# Patient Record
Sex: Male | Born: 1965 | Race: Black or African American | Hispanic: No | Marital: Married | Smoking: Never smoker
Health system: Southern US, Community
[De-identification: ages and names within clinical notes are randomized; demographics above are authoritative.]

## PROBLEM LIST (undated history)

## (undated) DIAGNOSIS — N182 Chronic kidney disease, stage 2 (mild): Secondary | ICD-10-CM

## (undated) DIAGNOSIS — R001 Bradycardia, unspecified: Secondary | ICD-10-CM

## (undated) DIAGNOSIS — E7800 Pure hypercholesterolemia, unspecified: Secondary | ICD-10-CM

## (undated) DIAGNOSIS — I10 Essential (primary) hypertension: Secondary | ICD-10-CM

## (undated) DIAGNOSIS — I350 Nonrheumatic aortic (valve) stenosis: Secondary | ICD-10-CM

## (undated) DIAGNOSIS — A048 Other specified bacterial intestinal infections: Secondary | ICD-10-CM

## (undated) DIAGNOSIS — I2510 Atherosclerotic heart disease of native coronary artery without angina pectoris: Secondary | ICD-10-CM

## (undated) HISTORY — DX: Pure hypercholesterolemia, unspecified: E78.00

## (undated) HISTORY — DX: Atherosclerotic heart disease of native coronary artery without angina pectoris: I25.10

## (undated) HISTORY — PX: CARDIAC CATHETERIZATION: SHX172

## (undated) HISTORY — DX: Bradycardia, unspecified: R00.1

## (undated) HISTORY — DX: Nonrheumatic aortic (valve) stenosis: I35.0

## (undated) HISTORY — DX: Essential (primary) hypertension: I10

## (undated) HISTORY — DX: Other specified bacterial intestinal infections: A04.8

## (undated) HISTORY — PX: WISDOM TOOTH EXTRACTION: SHX21

---

## 2001-02-06 ENCOUNTER — Emergency Department (HOSPITAL_COMMUNITY): Admission: EM | Admit: 2001-02-06 | Discharge: 2001-02-06 | Payer: Self-pay | Admitting: Emergency Medicine

## 2001-02-06 ENCOUNTER — Encounter: Payer: Self-pay | Admitting: Emergency Medicine

## 2002-10-04 ENCOUNTER — Observation Stay (HOSPITAL_COMMUNITY): Admission: AD | Admit: 2002-10-04 | Discharge: 2002-10-05 | Payer: Self-pay | Admitting: Emergency Medicine

## 2002-10-04 ENCOUNTER — Encounter: Payer: Self-pay | Admitting: *Deleted

## 2002-10-04 ENCOUNTER — Encounter (INDEPENDENT_AMBULATORY_CARE_PROVIDER_SITE_OTHER): Payer: Self-pay | Admitting: Cardiology

## 2004-05-07 ENCOUNTER — Emergency Department (HOSPITAL_COMMUNITY): Admission: EM | Admit: 2004-05-07 | Discharge: 2004-05-07 | Payer: Self-pay | Admitting: Emergency Medicine

## 2004-05-08 ENCOUNTER — Other Ambulatory Visit: Admission: RE | Admit: 2004-05-08 | Discharge: 2004-05-08 | Payer: Self-pay | Admitting: Interventional Radiology

## 2004-05-08 ENCOUNTER — Encounter: Admission: RE | Admit: 2004-05-08 | Discharge: 2004-05-08 | Payer: Self-pay | Admitting: Orthopedic Surgery

## 2004-05-08 ENCOUNTER — Encounter (INDEPENDENT_AMBULATORY_CARE_PROVIDER_SITE_OTHER): Payer: Self-pay | Admitting: *Deleted

## 2007-07-28 ENCOUNTER — Emergency Department (HOSPITAL_COMMUNITY): Admission: EM | Admit: 2007-07-28 | Discharge: 2007-07-28 | Payer: Self-pay | Admitting: Emergency Medicine

## 2008-06-14 ENCOUNTER — Ambulatory Visit (HOSPITAL_COMMUNITY): Admission: RE | Admit: 2008-06-14 | Discharge: 2008-06-14 | Payer: Self-pay | Admitting: Cardiology

## 2008-11-08 ENCOUNTER — Encounter: Payer: Self-pay | Admitting: Emergency Medicine

## 2008-11-08 ENCOUNTER — Ambulatory Visit: Payer: Self-pay | Admitting: Cardiovascular Disease

## 2008-11-08 ENCOUNTER — Ambulatory Visit: Payer: Self-pay | Admitting: Diagnostic Radiology

## 2008-11-08 ENCOUNTER — Inpatient Hospital Stay (HOSPITAL_COMMUNITY): Admission: AD | Admit: 2008-11-08 | Discharge: 2008-11-10 | Payer: Self-pay | Admitting: Cardiology

## 2010-05-18 LAB — POCT CARDIAC MARKERS
CKMB, poc: 4.9 ng/mL (ref 1.0–8.0)
Myoglobin, poc: 137 ng/mL (ref 12–200)
Myoglobin, poc: 98.7 ng/mL (ref 12–200)
Troponin i, poc: <0.05 ng/mL (ref 0.00–0.09)

## 2010-05-18 LAB — BASIC METABOLIC PANEL
BUN: 16 mg/dL (ref 6–23)
CO2: 32 mEq/L (ref 19–32)
Calcium: 8.5 mg/dL (ref 8.4–10.5)
Calcium: 9.6 mg/dL (ref 8.4–10.5)
Creatinine, Ser: 1.11 mg/dL (ref 0.4–1.5)
Creatinine, Ser: 1.1 mg/dL (ref 0.4–1.5)
GFR calc Af Amer: >60 mL/min (ref >60)
GFR calc non Af Amer: >60 mL/min (ref >60)
GFR calc non Af Amer: >60 mL/min (ref >60)
Glucose, Bld: 99 mg/dL (ref 70–99)
Potassium: 3.7 mEq/L (ref 3.5–5.1)
Sodium: 143 mEq/L (ref 135–145)

## 2010-05-18 LAB — CARDIAC PANEL(CRET KIN+CKTOT+MB+TROPI)
CK, MB: 3.3 ng/mL (ref 0.3–4.0)
CK, MB: 4.6 ng/mL — ABNORMAL HIGH (ref 0.3–4.0)
Relative Index: 1.6 (ref 0.0–2.5)
Relative Index: 2 (ref 0.0–2.5)
Total CK: 186 U/L (ref 7–232)
Total CK: 280 U/L — ABNORMAL HIGH (ref 7–232)
Troponin I: 0.01 ng/mL (ref 0.00–0.06)
Troponin I: 0.02 ng/mL (ref 0.00–0.06)
Troponin I: 0.02 ng/mL (ref 0.00–0.06)

## 2010-05-18 LAB — CBC
HCT: 39.2 % (ref 39.0–52.0)
Hemoglobin: 13.3 g/dL (ref 13.0–17.0)
Hemoglobin: 14.7 g/dL (ref 13.0–17.0)
MCHC: 33.5 g/dL (ref 30.0–36.0)
MCHC: 33.4 g/dL (ref 30.0–36.0)
MCV: 85.8 fL (ref 78.0–100.0)
Platelets: 180 10*3/uL (ref 150–400)
RBC: 4.41 MIL/uL (ref 4.22–5.81)
RBC: 5.14 MIL/uL (ref 4.22–5.81)
RDW: 13.1 % (ref 11.5–15.5)
WBC: 6 10*3/uL (ref 4.0–10.5)
WBC: 5.8 10*3/uL (ref 4.0–10.5)

## 2010-05-18 LAB — DIFFERENTIAL
Lymphocytes Relative: 39 % (ref 12–46)
Lymphs Abs: 2.3 10*3/uL (ref 0.7–4.0)
Monocytes Absolute: 0.5 10*3/uL (ref 0.1–1.0)
Monocytes Relative: 8 % (ref 3–12)
Neutro Abs: 2.8 10*3/uL (ref 1.7–7.7)
Neutrophils Relative %: 50 % (ref 43–77)

## 2010-05-18 LAB — LIPID PANEL
Cholesterol: 163 mg/dL (ref 0–200)
Total CHOL/HDL Ratio: 5.3 RATIO

## 2010-05-18 LAB — PROTIME-INR
INR: 1.1 (ref 0.00–1.49)
Prothrombin Time: 13.6 seconds (ref 11.6–15.2)

## 2010-05-18 LAB — HEPARIN LEVEL (UNFRACTIONATED): Heparin Unfractionated: 1.01 IU/mL — ABNORMAL HIGH (ref 0.30–0.70)

## 2010-06-02 ENCOUNTER — Other Ambulatory Visit: Payer: Self-pay | Admitting: Cardiology

## 2010-06-02 DIAGNOSIS — I2510 Atherosclerotic heart disease of native coronary artery without angina pectoris: Secondary | ICD-10-CM

## 2010-06-04 NOTE — Telephone Encounter (Signed)
escribe medication per fax request  

## 2010-06-26 NOTE — H&P (Signed)
NAME:  GENEROSO, CROPPER NO.:  [id number]   MEDICAL RECORD NO.:  [id number]           PATIENT TYPE:   LOCATION:                                 FACILITY:   PHYSICIAN:  Peter M. [place]aziland, M.D.       DATE OF BIRTH:   DATE OF ADMISSION:  06/14/2008  DATE OF DISCHARGE:                              HISTORY & PHYSICAL   HISTORY OF PRESENT ILLNESS:  Mr. Nathan Barrett is a 45 year old African American  male who was seen initially for evaluation for murmur.  He has a history  of hypertension and mild hypercholesterolemia.  On subsequent  evaluation, it was noted that he had had a cardiac catheterization in  2004, which demonstrated fairly extensive nonobstructive coronary  disease.  For this reason, the patient was further evaluated with an  echocardiogram and a stress Cardiolite study.  On his echocardiogram, he  was noted to have normal left ventricular size, wall thickness, and  function.  Ejection fraction was normal at 60%.  He was found to have  very mild aortic stenosis with a peak gradient of 18 mmHg, mean gradient  of 10 mmHg and a valve area of 1.5 sq cm.  His echo was otherwise  normal.  On stress Cardiolite study, he was able to walk for 13 minutes  and 30 seconds on the Bruce protocol.  He experienced no chest pain.  He  did have ST-segment depression in the lateral leads.  Subsequent  Cardiolite images demonstrated a moderate reversible defect in the  inferior wall consistent with ischemia.  Ejection fraction was normal at  64%.  Given these findings, it is felt that he probably has progressive  disease in the right coronary territory.  It is recommended he undergo  cardiac catheterization.   PAST MEDICAL HISTORY:  1. Hypertension.  2. Mild hypercholesterolemia.  3. History of H. pylori infection.  4. Mild aortic stenosis.  5. Coronary artery disease.   ALLERGIES:  No known allergies.   CURRENT MEDICATIONS:  1. Benicar HCT 40/25 mg per day.  2. Crestor 10 mg per  day.  3. Aspirin daily.   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  He exercises walking 3 days per week.  His denies tobacco or alcohol use.  He is married and has three  children.   FAMILY HISTORY:  Father is age 45 and has had prior coronary bypass  surgery.  He had a heart attack.  Mother died at age 75 with leukemia.  One brother age 70 has obstructive sleep apnea.   REVIEW OF SYSTEMS:  He denies any edema, orthopnea, or PND.  He has had  no bleeding problems.  His weight has been stable.  He has had no bowel  or bladder complaints.  All other systems are reviewed and are negative.   PHYSICAL EXAMINATION:  GENERAL:  The patient is a pleasant black male in  no apparent distress.  VITAL SIGNS:  Weight is 189.4 pounds, blood pressure is 122/70, pulse 52  and regular, respirations were normal.  HEENT:  Normocephalic, atraumatic.  Pupils equal, round, and reactive to  light and accommodation.  Extraocular movements were full.  Oropharynx  is clear.  NECK:  Supple without JVD, adenopathy, thyromegaly, or bruits.  LUNGS:  Clear.  CARDIAC:  A grade 2/6 systolic murmur in the apex.  There is normal S1  and S2 without gallop or click.  ABDOMEN:  Soft and nontender without mass or hepatosplenomegaly.  Femoral and pedal pulses are 2+ and symmetric.  He has no edema.  NEUROLOGIC:  Alert and oriented x4.  Cranial nerves II-XII are intact.  Mood is appropriate.  SKIN:  Warm and dry.   LABORATORY DATA:  ECG at rest shows sinus bradycardia.  He has some T-  wave flattening in leads III and V5 and V6.  Chest x-ray shows no active  disease.   IMPRESSION:  1. Coronary artery disease with abnormal stress Cardiolite study      showing evidence of inferior wall ischemia.  2. Hypertension.  3. Mild hypercholesterolemia.  4. Very mild aortic stenosis.   PLAN:  We will proceed with diagnostic cardiac catheterization with  potential  intervention if indicated.           ______________________________  Peter M. [place]il[place], M.D.     PMJ/MEDQ  D:  06/08/2008  T:  06/08/2008  Job:  [id number]   cc:   Carren Rang, M.D.

## 2010-06-26 NOTE — Cardiovascular Report (Signed)
NAME:  LOGON, UTTECH NO.:  [id number]   MEDICAL RECORD NO.:  [id number]          PATIENT TYPE:  OIB   LOCATION:  2899                         FACILITY:  [facility]   PHYSICIAN:  Peter M. [place]il[place], M.D.  DATE OF BIRTH:  1965-10-01   DATE OF PROCEDURE:  06/14/2008  DATE OF DISCHARGE:                            CARDIAC CATHETERIZATION   INDICATIONS FOR PROCEDURE:  A 45 year old African American male with  history of nonobstructive coronary disease by cardiac catheterization in  2004.  Recent stress Cardiolite study demonstrated evidence of inferior  wall ischemia and a high workload.  The patient was asymptomatic.  He  did have ECG changes as well.   PROCEDURE:  Left heart catheterization, coronary left ventricular  angiography.   EQUIPMENT:  A 6-French 4 cm right and left Judkins catheter, 6-French  pigtail catheter, and 6-French arterial sheath.   MEDICATIONS:  Local anesthesia 1% Xylocaine, Versed 2 mg IV, fentanyl 25  mcg IV.   CONTRAST:  105 mL of Omnipaque.   HEMODYNAMIC DATA:  Aortic pressure was 116/69 with a mean of 90 mmHg,  left ventricle pressure was 113 with an EDP of 11 mmHg.  The patient's  heart rate throughout the procedure was in the high 40s and low 50s.   ANGIOGRAPHIC DATA:  Left coronary artery rises and distributes normally  and stenosis in the very proximal vessel.  It had scattered  irregularities throughout its course.  In the distal vessel as it wraps  around the apex, there is 99% stenosis distally.  There is a large first  diagonal branch which bifurcates.  This has a high takeoff.  At the  bifurcation point, there is a 60% stenosis involving both branches.  The  second diagonal branch is moderate size and has irregularities up to  20%.   The left circumflex coronary artery is a small diminutive vessel.  It  terminates in a single very tiny marginal branch.  There is a 99%  stenosis at the origin of this tiny marginal branch.   The  right coronary artery is a dominant vessel.  It is diffusely  diseased.  There is 40-50% stenosis in the proximal vessel.  This is  followed by 40% stenosis in the midvessel and then another 50% stenosis  prior to the crux.  The distal vessel is diffusely diseased up to 40%.  The posterior descending artery is occluded in the midvessel.  There are  left-to-right collaterals to this vessel.  The posterolateral branch is  small in caliber and has a 95% stenosis proximally.   Left ventricular angiography was performed in the RAO view.  This  demonstrates normal left ventricular size and contractility with normal  systolic function.  Ejection fraction is estimated at 65%.   FINAL INTERPRETATION:  1. Severe three-vessel obstructive coronary artery disease,      predominantly involving small caliber distal branches.  He has      moderate disease in the proximal LAD and diagonal branches.  2. Normal left ventricular function.   PLAN:  The patient is really not well suited for percutaneous  intervention given the small caliber vessels.  Given his excellent  exercise tolerance and lack of recent symptoms, I would recommend  aggressive medical therapy and risk factor modification.           ______________________________  Peter M. [place], M.D.     PMJ/MEDQ  D:  06/14/2008  T:  06/15/2008  Job:  [id number]   cc:   Carren Rang, M.D.

## 2010-06-29 NOTE — Cardiovascular Report (Signed)
NAME:  Nathan Barrett, Nathan Barrett                           ACCOUNT NO.:  [id number]   MEDICAL RECORD NO.:  [id number]                   PATIENT TYPE:  INP   LOCATION:  1823                                 FACILITY:  [facility]   PHYSICIAN:  Madaline Savage, M.D.             DATE OF BIRTH:  15-Mar-1965   DATE OF PROCEDURE:  10/04/2002  DATE OF DISCHARGE:                              CARDIAC CATHETERIZATION   PROCEDURES PERFORMED:  1. Combined left heart catheterization.  2. Ascending aorta angiography.  3. Abdominal aorta angiography.   ENTRY SITE:  Right femoral.   DYE USED:  Omnipaque.   COMPLICATIONS:  None.   PATIENT PROFILE:  The patient is a 45 year old African-American gentleman  with a two year history of hypertension who awoke shortly after 3 a.m. today  and had subxiphoid and chest pain.  He presented to the [facility] Emergency  Room where a CK-MB and a troponin I were negative, but a myoglobin was 500  and abnormal.  His EKG showed ST segment elevation of approximately 2 mm in  leads V2, 3, and V4 and were unchanged from 4:08 a.m. to approximately four  hours and 30 minutes later.  There were also additional EKG nonspecific T-  wave abnormalities in leads 2, 3, and aVF.  The patient's pain was better at  the time of evaluation and because of his family history of CAD, his  hypertension, his abnormal EKG, his chest pain, and his elevated myoglobin,  it was decided to bring him to the catheterization laboratory which was done  on an urgent basis once a room was available.   RESULTS:  PRESSURES:  The left ventricular pressure was 125/9, end-diastolic  pressure 14.  Central aortic pressure was 125/80, mean of 100.  No aortic  valve gradient by pullback technique.   ANGIOGRAPHIC RESULTS:  There were no pericardial valvular or coronary  calcifications noted by fluoroscopy.  1. The left main coronary artery is large in caliber approaching 5 mm and no     lesions were seen.  The left  main coronary artery gives rise to a small     nondominant circumflex branch, a very large trifurcating intermediate     ramus branch, and an LAD which in turn gives rise to one diagonal branch.  2. The circumflex was nondominant and contained an ostial stenosis of 50%.     There was also a distal stenosis in this vessel of 75%.  The distal     vessel was about 1.5 mm or less in diameter.  This was, as previously     stated, a nondominant vessel.  3. The left anterior descending coronary artery contained luminal     irregularities throughout its course including a 30% stenosis just beyond     a septal perforator branch and a 60% stenosis at the apex as the vessel     coursed around the  apex and supplied a third of the inferior septal wall.  4. The diagonal branch of the LAD appeared normal.  5. The intermediate ramus branch showed a 50% or less ostial stenosis of     that trifurcating branch.  6. The right coronary artery contained mild disease including a 30%     proximal, a 50% mid stenosis in the RCA and there was a stenosis in the     posterolateral branch approximately of 50%.  7. Left ventricular angiography showed normal contractility of the left     ventricle with an ejection fraction estimate of 70%.  No wall motion     abnormalities and no mitral regurgitation.  8. Aorta showed no evidence of ascending aortic aneurysm and no aortic     regurgitation.  9. Renal arteries were normal and single bilaterally and the abdominal aorta     was also normal.   FINAL DIAGNOSES:  1. Mild early three vessel coronary disease (there are no objective findings     to suggest acute coronary syndrome or myocardial infarction).  2. Supranormal left ventricular systolic function, ejection fraction 70%.  3. No evidence of aortic ectasia or dissection of the ascending aorta.  4. No aortic regurgitation seen.  5. Normal renal arteries.  6. Normal abdominal aorta.   RECOMMENDATIONS:  The patient  should have his lifestyle modification  stressed to him including diet, weight loss, use of aspirin, beta blockers,  and Statin drugs.  We will continue to do a chest pain work-up to include a  2-D echocardiogram and observe him overnight on telemetry.                                               Madaline Savage, M.D.    WHG/MEDQ  D:  10/04/2002  T:  10/04/2002  Job:  [id number]   cc:   Alwyn Pea, M.D.  [facility]   Cath Lab

## 2010-07-10 ENCOUNTER — Other Ambulatory Visit: Payer: Self-pay | Admitting: *Deleted

## 2010-07-10 MED ORDER — ROSUVASTATIN CALCIUM 20 MG PO TABS: 20.0000 mg | ORAL_TABLET | Freq: Every day | ORAL | Status: DC

## 2010-07-10 NOTE — Telephone Encounter (Signed)
Refilled crestor x 2; needs office visit and fasting labs

## 2010-10-13 ENCOUNTER — Other Ambulatory Visit: Payer: Self-pay | Admitting: Cardiology

## 2010-10-16 NOTE — Telephone Encounter (Signed)
escribe medication per fax request  

## 2010-10-23 ENCOUNTER — Encounter: Payer: Self-pay | Admitting: Cardiology

## 2010-11-02 ENCOUNTER — Encounter: Payer: Self-pay | Admitting: Cardiology

## 2010-11-02 ENCOUNTER — Ambulatory Visit (INDEPENDENT_AMBULATORY_CARE_PROVIDER_SITE_OTHER): Payer: PRIVATE HEALTH INSURANCE | Admitting: Cardiology

## 2010-11-02 VITALS — BP 122/82 | HR 60 | Ht 65.0 in | Wt 192.6 lb

## 2010-11-02 DIAGNOSIS — I2510 Atherosclerotic heart disease of native coronary artery without angina pectoris: Secondary | ICD-10-CM

## 2010-11-02 DIAGNOSIS — I350 Nonrheumatic aortic (valve) stenosis: Secondary | ICD-10-CM | POA: Insufficient documentation

## 2010-11-02 DIAGNOSIS — I10 Essential (primary) hypertension: Secondary | ICD-10-CM

## 2010-11-02 DIAGNOSIS — I359 Nonrheumatic aortic valve disorder, unspecified: Secondary | ICD-10-CM

## 2010-11-02 DIAGNOSIS — E7800 Pure hypercholesterolemia, unspecified: Secondary | ICD-10-CM

## 2010-11-02 NOTE — Progress Notes (Signed)
Nathan Barrett Date of Birth: December 16, 1965   History of Present Illness: Nathan Barrett is seen today for followup. He has a known history of coronary disease with extensive disease in the distal vessels and small vessels. This was by cardiac catheterization in May of 2010. He has been managed medically. He has not been seen for the last year and a half. He denies any significant symptoms of chest pain, shortness of breath, or palpitations. He does not get any exercise. He has gained 18 pounds since his last visit. He continues on his medications and reports that his blood pressure has been okay. He has been intolerant of beta blockers in the past because of bradycardia.  Current Outpatient Prescriptions on File Prior to Visit  Medication Sig Dispense Refill  . aspirin 325 MG tablet Take 325 mg by mouth daily.        Marland Kitchen BENICAR HCT 40-25 MG per tablet Take 0.5 tablets by mouth Daily.      . Cholecalciferol (VITAMIN D PO) Take by mouth daily.        . CRESTOR 20 MG tablet TAKE 1 TABLET BY MOUTH EVERY NIGHT AT BEDTIME  30 tablet  5  . Omega-3 Fatty Acids (FISH OIL PO) Take by mouth.        Marland Kitchen PLAVIX 75 MG tablet TAKE 1 TABLET BY MOUTH EVERY DAY  270 tablet  3  . isosorbide mononitrate (IMDUR) 60 MG 24 hr tablet Take 60 mg by mouth daily.        Marland Kitchen NITROGLYCERIN PO Take by mouth as needed.          No Known Allergies  Past Medical History  Diagnosis Date  . Coronary artery disease   . Aortic stenosis     Mild  . Hypercholesterolemia   . Hypertension   . H. pylori infection   . Bradycardia     Past Surgical History  Procedure Date  . Cardiac catheterization     Ejection Fraction 65%    History  Smoking status  . Never Smoker   Smokeless tobacco  . Not on file    History  Alcohol Use No    Family History  Problem Relation Age of Onset  . Cancer Mother   . Heart disease Father     Review of Systems: The review of systems is positive for weight gain.  All other systems were  reviewed and are negative.  Physical Exam: BP 122/82  Pulse 60  Ht 5\' 5"  (1.651 m)  Wt 192 lb 9.6 oz (87.363 kg)  BMI 32.05 kg/m2 The patient is alert and oriented x 3.  The mood and affect are normal.  The skin is warm and dry.  Color is normal.  The HEENT exam reveals that the sclera are nonicteric.  The mucous membranes are moist.  The carotids are 2+ without bruits.  There is no thyromegaly.  There is no JVD.  The lungs are clear.  The chest wall is non tender.  The heart exam reveals a regular rate with a normal S1 and S2.  There are no murmurs, gallops, or rubs.  The PMI is not displaced.   Abdominal exam reveals good bowel sounds.  There is no guarding or rebound.  There is no hepatosplenomegaly or tenderness.  There are no masses.  Exam of the legs reveal no clubbing, cyanosis, or edema.  The legs are without rashes.  The distal pulses are intact.  Cranial nerves II - XII are  intact.  Motor and sensory functions are intact.  The gait is normal.  LABORATORY DATA:   Assessment / Plan:

## 2010-11-02 NOTE — Assessment & Plan Note (Signed)
He has significant small vessel and distal coronary disease. He is asymptomatic. We will continue with aggressive risk factor modification.

## 2010-11-02 NOTE — Assessment & Plan Note (Signed)
The pressure is well controlled on his current medications. We will continue the same.

## 2010-11-02 NOTE — Patient Instructions (Signed)
Increase your aerobic activity to 30 minutes a day.  Eliminate sweets from your diet especially sodas. Eat a lot of fruits and vegetables.  We will schedule you for fasting lab work.  I will see you again in 1 year.

## 2010-11-02 NOTE — Assessment & Plan Note (Signed)
We will schedule him for fasting lab work including chemistries and a lipid panel. Have recommended weight loss and regular aerobic exercise. I recommended that he avoid concentrated sweets or sodas. He needs to reduce the starches in his diet.

## 2010-11-16 ENCOUNTER — Other Ambulatory Visit: Payer: PRIVATE HEALTH INSURANCE | Admitting: *Deleted

## 2010-11-23 ENCOUNTER — Other Ambulatory Visit (INDEPENDENT_AMBULATORY_CARE_PROVIDER_SITE_OTHER): Payer: PRIVATE HEALTH INSURANCE | Admitting: *Deleted

## 2010-11-23 DIAGNOSIS — I2510 Atherosclerotic heart disease of native coronary artery without angina pectoris: Secondary | ICD-10-CM

## 2010-11-23 LAB — HEPATIC FUNCTION PANEL
ALT: 25 U/L (ref 0–53)
Bilirubin, Direct: 0 mg/dL (ref 0.0–0.3)
Total Bilirubin: 0.4 mg/dL (ref 0.3–1.2)

## 2010-11-23 LAB — LIPID PANEL
Cholesterol: 117 mg/dL (ref 0–200)
LDL Cholesterol: 60 mg/dL (ref 0–99)
Total CHOL/HDL Ratio: 3
VLDL: 16.6 mg/dL (ref 0.0–40.0)

## 2010-11-28 ENCOUNTER — Telehealth: Payer: Self-pay | Admitting: *Deleted

## 2010-11-28 DIAGNOSIS — I10 Essential (primary) hypertension: Secondary | ICD-10-CM

## 2010-11-28 NOTE — Telephone Encounter (Signed)
Notified of lab results. BNP was ordered instead of BMET. Will get billing to correct. He will come in next Fri to get BMET.

## 2010-11-28 NOTE — Telephone Encounter (Signed)
Message copied by Lorayne Bender on Wed Nov 28, 2010  5:02 PM ------      Message from: [place], PETER M      Created: Sun Nov 25, 2010  7:44 PM       Lipids look quite good. HFP is normal. BNP is normal but should have been a Bmet instead. He shouldn't be charged for a BNP. We can get a Bmet at his convenience.      Theron Arista [place]

## 2010-12-07 ENCOUNTER — Other Ambulatory Visit: Payer: PRIVATE HEALTH INSURANCE | Admitting: *Deleted

## 2011-01-24 ENCOUNTER — Telehealth: Payer: Self-pay | Admitting: Cardiology

## 2011-01-24 NOTE — Telephone Encounter (Signed)
PT Signed ROI.Marland KitchenPicked up copy of Labs   01/24/11/Km

## 2011-04-24 ENCOUNTER — Other Ambulatory Visit: Payer: Self-pay | Admitting: Cardiology

## 2011-06-16 ENCOUNTER — Other Ambulatory Visit: Payer: Self-pay | Admitting: Cardiology

## 2011-06-17 NOTE — Telephone Encounter (Signed)
Refilled generic plavix 

## 2012-03-15 ENCOUNTER — Emergency Department (HOSPITAL_COMMUNITY)
Admission: EM | Admit: 2012-03-15 | Discharge: 2012-03-15 | Disposition: A | Discharge status: Home / Self Care | Payer: BC Managed Care – PPO | Attending: Emergency Medicine | Admitting: Emergency Medicine

## 2012-03-15 ENCOUNTER — Emergency Department (HOSPITAL_COMMUNITY): Payer: BC Managed Care – PPO

## 2012-03-15 ENCOUNTER — Encounter (HOSPITAL_COMMUNITY): Payer: Self-pay

## 2012-03-15 DIAGNOSIS — Z79899 Other long term (current) drug therapy: Secondary | ICD-10-CM | POA: Insufficient documentation

## 2012-03-15 DIAGNOSIS — Z8619 Personal history of other infectious and parasitic diseases: Secondary | ICD-10-CM | POA: Insufficient documentation

## 2012-03-15 DIAGNOSIS — I2510 Atherosclerotic heart disease of native coronary artery without angina pectoris: Secondary | ICD-10-CM | POA: Insufficient documentation

## 2012-03-15 DIAGNOSIS — R42 Dizziness and giddiness: Secondary | ICD-10-CM | POA: Insufficient documentation

## 2012-03-15 DIAGNOSIS — R55 Syncope and collapse: Secondary | ICD-10-CM | POA: Insufficient documentation

## 2012-03-15 DIAGNOSIS — Z7982 Long term (current) use of aspirin: Secondary | ICD-10-CM | POA: Insufficient documentation

## 2012-03-15 DIAGNOSIS — Z8679 Personal history of other diseases of the circulatory system: Secondary | ICD-10-CM | POA: Insufficient documentation

## 2012-03-15 DIAGNOSIS — R110 Nausea: Secondary | ICD-10-CM | POA: Insufficient documentation

## 2012-03-15 DIAGNOSIS — E7800 Pure hypercholesterolemia, unspecified: Secondary | ICD-10-CM | POA: Insufficient documentation

## 2012-03-15 DIAGNOSIS — Z7901 Long term (current) use of anticoagulants: Secondary | ICD-10-CM | POA: Insufficient documentation

## 2012-03-15 DIAGNOSIS — I10 Essential (primary) hypertension: Secondary | ICD-10-CM | POA: Insufficient documentation

## 2012-03-15 LAB — POCT I-STAT, CHEM 8
BUN: 20 mg/dL (ref 6–23)
Creatinine, Ser: 1.5 mg/dL — ABNORMAL HIGH (ref 0.50–1.35)
Hemoglobin: 15.6 g/dL (ref 13.0–17.0)
Potassium: 3.1 mEq/L — ABNORMAL LOW (ref 3.5–5.1)
Sodium: 143 mEq/L (ref 135–145)
TCO2: 27 mmol/L (ref 0–100)

## 2012-03-15 LAB — GLUCOSE, CAPILLARY: Glucose-Capillary: 124 mg/dL — ABNORMAL HIGH (ref 70–99)

## 2012-03-15 MED ORDER — POTASSIUM CHLORIDE CRYS ER 20 MEQ PO TBCR
40.0000 meq | EXTENDED_RELEASE_TABLET | Freq: Once | ORAL | Status: AC
  Administered 2012-03-15: 40 meq via ORAL
  Filled 2012-03-15: qty 2

## 2012-03-15 NOTE — ED Provider Notes (Signed)
History     CSN: [id number]  Arrival date & time 03/15/12  1703   First MD Initiated Contact with Patient 03/15/12 1713      Chief Complaint  Patient presents with  . Near Syncope    (Consider location/radiation/quality/duration/timing/severity/associated sxs/prior treatment) HPI This 47 year old male has a history of coronary artery disease, mild aortic stenosis, today had a spell of apparent syncope with prodrome. He had syncope a few years ago and was admitted for observation at that time without incident. Today he was on his way to the bathroom became warm sweaty nauseated lightheaded the next thing he knew he woke up on the floor of the bathroom at a restaurant he was at. He woke up on the floor and did not appear postictal. Patient feels totally asymptomatic now. He is no headache neck pain back pain chest pain palpitation shortness breath abdominal pain vomiting or any change in speech vision swallowing or understanding as well as no lateralizing or focal weakness numbness or incoordination. There is no treatment prior to arrival. He feels fine now. Past Medical History  Diagnosis Date  . Coronary artery disease   . Aortic stenosis     Mild  . Hypercholesterolemia   . Hypertension   . H. pylori infection   . Bradycardia     Past Surgical History  Procedure Laterality Date  . Cardiac catheterization      Ejection Fraction 65%    Family History  Problem Relation Age of Onset  . Leukemia Mother   . Heart disease Father     CABG  . Obstructive Sleep Apnea Brother     History  Substance Use Topics  . Smoking status: Never Smoker   . Smokeless tobacco: Not on file  . Alcohol Use: No      Review of Systems 10 Systems reviewed and are negative for acute change except as noted in the HPI. Allergies  Review of patient's allergies indicates no known allergies.  Home Medications   Current Outpatient Rx  Name  Route  Sig  Dispense  Refill  . aspirin 325 MG  tablet   Oral   Take 325 mg by mouth daily.           Marland Kitchen BENICAR HCT 40-25 MG per tablet   Oral   Take 0.5 tablets by mouth Daily.         . Cholecalciferol (VITAMIN D PO)   Oral   Take by mouth daily.           . clopidogrel (PLAVIX) 75 MG tablet   Oral   Take 75 mg by mouth daily.         . Omega-3 Fatty Acids (FISH OIL PO)   Oral   Take by mouth.           . rosuvastatin (CRESTOR) 20 MG tablet   Oral   Take 20 mg by mouth daily.           BP 111/68  Pulse 63  Temp(Src) 98.2 F (36.8 C) (Oral)  Resp 16  SpO2 99%  Physical Exam  Nursing note and vitals reviewed. Constitutional:       Awake, alert, nontoxic appearance with baseline speech for patient.  HENT:  Head: Atraumatic.  Mouth/Throat: No oropharyngeal exudate.  Eyes: EOM are normal. Pupils are equal, round, and reactive to light. Right eye exhibits no discharge. Left eye exhibits no discharge.  Neck: Neck supple.       Cervical spine and  back are nontender  Cardiovascular: Normal rate and regular rhythm.   No murmur heard. Pulmonary/Chest: Effort normal and breath sounds normal. No stridor. No respiratory distress. He has no wheezes. He has no rales. He exhibits no tenderness.  Abdominal: Soft. Bowel sounds are normal. He exhibits no mass. There is no tenderness. There is no rebound.  Musculoskeletal: He exhibits no tenderness.       Baseline ROM, moves extremities with no obvious new focal weakness.  Lymphadenopathy:    He has no cervical adenopathy.  Neurological: He is alert.       Awake, alert, cooperative and aware of situation; motor strength bilaterally; sensation normal to light touch bilaterally; peripheral visual fields full to confrontation; no facial asymmetry; tongue midline; major cranial nerves appear intact; no pronator drift, normal finger to nose bilaterally, baseline gait without new ataxia.  Skin: No rash noted.  Psychiatric: He has a normal mood and affect.    ED Course   Procedures (including critical care time) ECG: Sinus rhythm, ventricular rate 64, normal axis, prolonged QT interval with a QTC 512 ms, RSR prime in lead V2, no acute ischemic changes noted, compared with September 2010 ST elevation no longer present, QT interval now prolonged  Labs Reviewed  GLUCOSE, CAPILLARY - Abnormal; Notable for the following:    Glucose-Capillary 124 (*)    All other components within normal limits  POCT I-STAT, CHEM 8 - Abnormal; Notable for the following:    Potassium 3.1 (*)    Creatinine, Ser 1.50 (*)    Glucose, Bld 115 (*)    All other components within normal limits   No results found.   1. Syncope       MDM  Pt stable in ED with no significant deterioration in condition.  Patient / Family informed of clinical course, understand medical decision-making process, and agree with plan.  I doubt any other EMC precluding discharge at this time including, but not necessarily limited to the following:ACS, TIA, SAH, CVA. I believe the patient is stable for discharge to follow up as an outpatient with his cardiologist even though he now has a QT interval prolonged on his ECG.        Hurman Horn, MD 03/21/12 1254

## 2012-03-15 NOTE — ED Notes (Signed)
Per ems- Pt was on way to bathroom, got to the door, became dizzy, diaphoretic, and nauseous. Denies pain/numbness. Pt awoke on floor, felt weak. Pt doesn't know if he fell or slid down to floor. Denies any pain, no evidence of falling or hitting head. 1st degree heart block noted on 12-lead, unknown if pt has hx of same. 18g iv in LAC. BP-114/76 HR-65 RR-18 O2-96% on RA.

## 2012-03-15 NOTE — ED Notes (Signed)
Pt states he was at restaurant, went to go to bathroom, felt dizzy, sweaty, nauseous. Pt states "I think I fell and hit the door." NAd noted. Denies any pain or complaints at this time. Denies cp. Neuro intact, no deficits

## 2012-03-19 ENCOUNTER — Encounter: Payer: Self-pay | Admitting: *Deleted

## 2012-03-20 ENCOUNTER — Ambulatory Visit (INDEPENDENT_AMBULATORY_CARE_PROVIDER_SITE_OTHER): Payer: BC Managed Care – PPO | Admitting: Cardiovascular Disease

## 2012-03-20 ENCOUNTER — Encounter: Payer: Self-pay | Admitting: Cardiovascular Disease

## 2012-03-20 VITALS — BP 115/80 | HR 55 | Ht 65.0 in | Wt 191.0 lb

## 2012-03-20 DIAGNOSIS — I2510 Atherosclerotic heart disease of native coronary artery without angina pectoris: Secondary | ICD-10-CM

## 2012-03-20 DIAGNOSIS — R55 Syncope and collapse: Secondary | ICD-10-CM

## 2012-03-20 DIAGNOSIS — E7800 Pure hypercholesterolemia, unspecified: Secondary | ICD-10-CM

## 2012-03-20 DIAGNOSIS — I350 Nonrheumatic aortic (valve) stenosis: Secondary | ICD-10-CM

## 2012-03-20 DIAGNOSIS — I10 Essential (primary) hypertension: Secondary | ICD-10-CM

## 2012-03-20 DIAGNOSIS — I359 Nonrheumatic aortic valve disorder, unspecified: Secondary | ICD-10-CM

## 2012-03-20 DIAGNOSIS — I25810 Atherosclerosis of coronary artery bypass graft(s) without angina pectoris: Secondary | ICD-10-CM

## 2012-03-20 NOTE — Assessment & Plan Note (Signed)
Labs with [facility] PA at [facility] near [place]

## 2012-03-20 NOTE — Patient Instructions (Addendum)
Your physician recommends that you schedule a follow-up appointment in:  F/U  WITH DR  [place] AFTER TESTS ARE DONE Your physician recommends that you continue on your current medications as directed. Please refer to the Current Medication list given to you today. Your physician has requested that you have an echocardiogram. Echocardiography is a painless test that uses sound waves to create images of your heart. It provides your doctor with information about the size and shape of your heart and how well your heart's chambers and valves are working. This procedure takes approximately one hour. There are no restrictions for this procedure.   Your physician has requested that you have en exercise stress myoview. For further information please visit [email or web address] Please follow instruction sheet, as given.

## 2012-03-20 NOTE — Assessment & Plan Note (Signed)
Well controlled.  Continue current medications and low sodium Dash type diet.    

## 2012-03-20 NOTE — Assessment & Plan Note (Signed)
Known 3VD with syncope Clincially stable but favor myovue to assess ischemic burden.  T wave changes in 3, F and known disease would make myovue better test to define extent and location of ischemia

## 2012-03-20 NOTE — Assessment & Plan Note (Addendum)
Easily audible murmur on exam  Patient was not aware of this issue  Seems mild on exam and actually murmur radiates to apex  Echo 2004 reviewed and AV trileaflet and no indication of significant valve disease  F//U echo

## 2012-03-20 NOTE — Progress Notes (Signed)
Patient ID: Nathan Barrett, male   DOB: Jun 07, 1965, 47 y.o.   MRN: [id number] 47 yo patient of Dr Jakaden Ouzts [place].  Referred by ER for syncope.  Reviewed records from ER  R/O no arrhythia on telemtry. CT head negative and labs remarkable for K of 3.1  Was finishing a meal at a restaurant and felt queezy in his stomach then got dizzy.  Passed out on way to bathroom  No dyspnea chest pain or palpitations Last seen by Dr [place] 2012. Cath in 2010 with 3V CAD small vessels and distal so medical Rx was advised.  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  Has not had to take nitro.  Weight is up with high carb diet. No previous syncope or history of arrhythmia.   CRF;s HTN and elevated lipids on Rx  Also taking ASA and Plavix  ROS: Denies fever, malais, weight loss, blurry vision, decreased visual acuity, cough, sputum, SOB, hemoptysis, pleuritic pain, palpitaitons, heartburn, abdominal pain, melena, lower extremity edema, claudication, or rash.  All other systems reviewed and negative   General: Affect appropriate Healthy:  appears stated age HEENT: normal Neck supple with no adenopathy JVP normal no bruits no thyromegaly Lungs clear with no wheezing and good diaphragmatic motion Heart:  S1/S2 SEM  murmur,rub, gallop or click PMI normal Abdomen: benighn, BS positve, no tenderness, no AAA no bruit.  No HSM or HJR Distal pulses intact with no bruits No edema Neuro non-focal Skin warm and dry No muscular weakness  Medications Current Outpatient Prescriptions  Medication Sig Dispense Refill  . aspirin 325 MG tablet Take 325 mg by mouth daily.        Marland Kitchen BENICAR HCT 40-25 MG per tablet Take 0.5 tablets by mouth Daily.      . Cholecalciferol (VITAMIN D PO) Take by mouth daily.        . clopidogrel (PLAVIX) 75 MG tablet Take 75 mg by mouth daily.      . Omega-3 Fatty Acids (FISH OIL PO) Take by mouth.        . rosuvastatin (CRESTOR) 20 MG tablet Take 20 mg by mouth daily.         Allergies Review of patient's allergies indicates no known allergies.  Family History: Family History  Problem Relation Age of Onset  . Leukemia Mother   . Heart disease Father     CABG  . Obstructive Sleep Apnea Brother     Social History: History   Social History  . Marital Status: Married    Spouse Name: N/A    Number of Children: 3  . Years of Education: N/A   Occupational History  . route sales    Social History Main Topics  . Smoking status: Never Smoker   . Smokeless tobacco: Not on file  . Alcohol Use: No  . Drug Use:   . Sexually Active:    Other Topics Concern  . Not on file   Social History Narrative  . No narrative on file    Electrocardiogram:  Assessment and Plan

## 2012-03-26 ENCOUNTER — Other Ambulatory Visit (HOSPITAL_COMMUNITY): Payer: BC Managed Care – PPO

## 2012-04-02 ENCOUNTER — Ambulatory Visit (HOSPITAL_COMMUNITY): Payer: BC Managed Care – PPO | Attending: Cardiology | Admitting: Radiology

## 2012-04-02 VITALS — BP 119/63 | Ht 65.0 in | Wt 192.0 lb

## 2012-04-02 DIAGNOSIS — R55 Syncope and collapse: Secondary | ICD-10-CM | POA: Insufficient documentation

## 2012-04-02 DIAGNOSIS — R42 Dizziness and giddiness: Secondary | ICD-10-CM | POA: Insufficient documentation

## 2012-04-02 DIAGNOSIS — I10 Essential (primary) hypertension: Secondary | ICD-10-CM | POA: Insufficient documentation

## 2012-04-02 DIAGNOSIS — I2510 Atherosclerotic heart disease of native coronary artery without angina pectoris: Secondary | ICD-10-CM

## 2012-04-02 DIAGNOSIS — R0602 Shortness of breath: Secondary | ICD-10-CM

## 2012-04-02 DIAGNOSIS — E785 Hyperlipidemia, unspecified: Secondary | ICD-10-CM | POA: Insufficient documentation

## 2012-04-02 DIAGNOSIS — R9431 Abnormal electrocardiogram [ECG] [EKG]: Secondary | ICD-10-CM | POA: Insufficient documentation

## 2012-04-02 DIAGNOSIS — R079 Chest pain, unspecified: Secondary | ICD-10-CM

## 2012-04-02 DIAGNOSIS — I25810 Atherosclerosis of coronary artery bypass graft(s) without angina pectoris: Secondary | ICD-10-CM

## 2012-04-02 MED ORDER — TECHNETIUM TC 99M SESTAMIBI GENERIC - CARDIOLITE
11.0000 | Freq: Once | INTRAVENOUS | Status: AC | PRN
  Administered 2012-04-02: 11 via INTRAVENOUS

## 2012-04-02 MED ORDER — TECHNETIUM TC 99M SESTAMIBI GENERIC - CARDIOLITE
33.0000 | Freq: Once | INTRAVENOUS | Status: AC | PRN
  Administered 2012-04-02: 33 via INTRAVENOUS

## 2012-04-02 MED ORDER — REGADENOSON 0.4 MG/5ML IV SOLN
0.4000 mg | Freq: Once | INTRAVENOUS | Status: AC
  Administered 2012-04-02: 0.4 mg via INTRAVENOUS

## 2012-04-02 NOTE — Progress Notes (Signed)
[facility] SITE 3 NUCLEAR MED [address] [place], [place] [address] [phone]    Cardiology Nuclear Med Study  Nathan Barrett is a 47 y.o. male     MRN : [id number]     DOB: [birth year]  Procedure Date: 04/02/2012  Nuclear Med Background Indication for Stress Test:  Evaluation for Ischemia, Abnormal EKG, and 03-15-12 ED: Syncope with (-) Head CT History:  '10 Myocardial Perfusion Study-ST depression laterally with exercise, inferior wall ischemia on images EF=64%>Cath: Severe 3 Vessel CAD, EF=65%, treat medically Cardiac Risk Factors: Family History - CAD, Hypertension and Lipids  Symptoms:  Dizziness, Syncope preceded by lightheadedness and hot sensation   Nuclear Pre-Procedure Caffeine/Decaff Intake:  None > 12 hrs NPO After: 9:00pm   Lungs:  clear O2 Sat: 98% on room air. IV 0.9% NS with Angio Cath:  20g  IV Site: R Antecubital x 1, tolerated well IV Started by:  Irean Hong, RN  Chest Size (in):  44 Cup Size: n/a  Height: 5\' 5"  (1.651 m)  Weight:  192 lb (87.091 kg)  BMI:  Body mass index is 31.95 kg/(m^2). Tech Comments:  Last Benicar 24 hrs ago per patient    Nuclear Med Study 1 or 2 day study: 1 day  Stress Test Type:  Lexiscan  Reading MD: Marca Ancona, MD  Order Authorizing Provider:  Charlton Haws, MD  Resting Radionuclide: Technetium 51m Sestamibi  Resting Radionuclide Dose: 11.0 mCi   Stress Radionuclide:  Technetium 24m Sestamibi  Stress Radionuclide Dose: 33.0 mCi           Stress Protocol Rest HR: 59 Stress HR: 115  Rest BP: 119/63 Stress BP: 131/60  Exercise Time (min): n/a METS: n/a   Predicted Max HR: 173 bpm % Max HR: 66.47 bpm Rate Pressure Product: 46962   Dose of Adenosine (mg):  n/a Dose of Lexiscan: 0.4 mg  Dose of Atropine (mg): n/a Dose of Dobutamine: n/a mcg/kg/min (at max HR)  Stress Test Technologist: Irean Hong, RN  Nuclear Technologist:  Domenic Polite, CNMT     Rest Procedure:  Myocardial perfusion imaging was  performed at rest 45 minutes following the intravenous administration of Technetium 49m Sestamibi. Rest ECG: NSR - Normal EKG  Stress Procedure:  The patient attempted to walk the treadmill utilizing the Bruce Protocol for 10:15 minutes, but was unable to reach target heart rate due to sudden onset of chest pressure and tightness,7/10, with the patient stating he needed to stop. The patient had EKG changes. The treadmill was stopped without the injection of cardiolite,and the patient was recovered. Ok was given by Dr. Peter [place] to do sitting Lexiscan per [facility], RT-N. The patient received IV Lexiscan 0.4 mg over 15-seconds.  Technetium 43m Sestamibi injected at 30-seconds. The patient complained of SOB, nausea, and tingling all over body, but denied chest pain.  Quantitative spect images were obtained after a 45 minute delay. Stress ECG: Significant ST abnormalities consistent with ischemia.  QPS Raw Data Images:  Normal; no motion artifact; normal heart/lung ratio. Stress Images:  Medium-sized, mild perfusion defect throughout the inferior wall.  Rest Images:  Normal homogeneous uptake in all areas of the myocardium. Subtraction (SDS):  Reversible inferior perfusion defect.  Transient Ischemic Dilatation (Normal <1.22):  1.12 Lung/Heart Ratio (Normal <0.45):  0.30  Quantitative Gated Spect Images QGS EDV:  115 ml QGS ESV:  47 ml  Impression Exercise Capacity:  Initially exercised 10:15 seconds but did not reach 85% MPHR.  He  had to stop due to chest pain.  Lexiscan was infused after consultation with Dr. [place].  BP Response:  Normal blood pressure response. Clinical Symptoms:  Nausea, dyspnea with Lexiscan, chest pain with exercise.  ECG Impression:  1-2 mm horizontal ST depression in V4-V6 at peak exercise, resolving rapidly in recovery.  Comparison with Prior Nuclear Study: Similar to report of prior nuclear study.   Overall Impression:  Intermediate stress nuclear study.  There  were ECG changes suggestive of ischemia along with chest pain on the treadmill, though ECG changes resolved rapidly in recovery and he had good exercise tolerance.  There was a medium-sized, mild intensity reversible inferior perfusion defect.  This study suggests ischemia.   LV Ejection Fraction: 60%.  LV Wall Motion:  NL LV Function; NL Wall Motion  Marca Ancona 04/02/2012

## 2012-04-08 ENCOUNTER — Other Ambulatory Visit (HOSPITAL_COMMUNITY): Payer: BC Managed Care – PPO

## 2012-04-09 ENCOUNTER — Encounter: Payer: Self-pay | Admitting: Cardiology

## 2012-04-09 ENCOUNTER — Ambulatory Visit (INDEPENDENT_AMBULATORY_CARE_PROVIDER_SITE_OTHER): Payer: BC Managed Care – PPO | Admitting: Cardiology

## 2012-04-09 VITALS — BP 120/70 | HR 45 | Ht 65.0 in | Wt 192.1 lb

## 2012-04-09 DIAGNOSIS — I2510 Atherosclerotic heart disease of native coronary artery without angina pectoris: Secondary | ICD-10-CM

## 2012-04-09 DIAGNOSIS — I350 Nonrheumatic aortic (valve) stenosis: Secondary | ICD-10-CM

## 2012-04-09 DIAGNOSIS — I359 Nonrheumatic aortic valve disorder, unspecified: Secondary | ICD-10-CM

## 2012-04-09 DIAGNOSIS — E7800 Pure hypercholesterolemia, unspecified: Secondary | ICD-10-CM

## 2012-04-09 DIAGNOSIS — R55 Syncope and collapse: Secondary | ICD-10-CM | POA: Insufficient documentation

## 2012-04-09 DIAGNOSIS — I10 Essential (primary) hypertension: Secondary | ICD-10-CM

## 2012-04-09 NOTE — Progress Notes (Signed)
Nathan Barrett Date of Birth: 1966-01-25   History of Present Illness: Nathan Barrett is seen today for followup. He has a known history of coronary disease with extensive disease in the distal vessels and small vessels. This was by cardiac catheterization in May of 2010. Stress Cardiolite at that time demonstrated a moderate inferior wall perfusion defect. He has been managed medically. Recently he was seen by Nathan Barrett after he experienced a syncopal episode. He was eating in a restaurant after he had been eating a while he became nauseated and then felt progressive dizziness and disorientation. He went to the bathroom and passed out. He has had no recurrent symptoms since then. His wife reports he has had about 4 of these episodes over a several year span. They're always associated with symptoms of flushing and sweating. He denies any symptoms of chest pain or shortness of breath.  Current Outpatient Prescriptions on File Prior to Visit  Medication Sig Dispense Refill  . aspirin 325 MG tablet Take 325 mg by mouth daily.        Marland Kitchen BENICAR HCT 40-25 MG per tablet Take 1 tablet by mouth Daily.       . Cholecalciferol (VITAMIN D PO) Take by mouth daily.        . Omega-3 Fatty Acids (FISH OIL PO) Take by mouth.        . rosuvastatin (CRESTOR) 20 MG tablet Take 20 mg by mouth daily.       No current facility-administered medications on file prior to visit.    No Known Allergies  Past Medical History  Diagnosis Date  . Coronary artery disease   . Aortic stenosis     Mild  . Hypercholesterolemia   . Hypertension   . H. pylori infection   . Bradycardia     Past Surgical History  Procedure Laterality Date  . Cardiac catheterization      Ejection Fraction 65%    History  Smoking status  . Never Smoker   Smokeless tobacco  . Not on file    History  Alcohol Use No    Family History  Problem Relation Age of Onset  . Leukemia Mother   . Heart disease Father     CABG  . Obstructive  Sleep Apnea Brother     Review of Systems: The review of systems is positive for weight gain.  All other systems were reviewed and are negative.  Physical Exam: BP 120/70  Pulse 45  Ht 5\' 5"  (1.651 m)  Wt 192 lb 1.9 oz (87.145 kg)  BMI 31.97 kg/m2  SpO2 96% The patient is alert and oriented x 3.  The mood and affect are normal.  The skin is warm and dry.  Color is normal.  The HEENT exam reveals that the sclera are nonicteric.  The mucous membranes are moist.  The carotids are 2+ without bruits.  There is no thyromegaly.  There is no JVD.  The lungs are clear.    The heart exam reveals a regular rate with a normal S1 and S2.  There is a grade 2/6 systolic murmur left sternal border.  The PMI is not displaced.   Abdominal exam reveals good bowel sounds.  There is no guarding or rebound.  There is no hepatosplenomegaly or tenderness.  There are no masses.  Exam of the legs reveal no clubbing, cyanosis, or edema.  The legs are without rashes.  The distal pulses are intact.  Cranial nerves II - XII  are intact.  Motor and sensory functions are intact.  The gait is normal.  LABORATORY DATA: Cardiology Nuclear Med Study  Nathan Barrett is a 47 y.o. male MRN : [id number] DOB: 1966/01/17  Procedure Date: 04/02/2012  Nuclear Med Background  Indication for Stress Test: Evaluation for Ischemia, Abnormal EKG, and 03-15-12 ED: Syncope with (-) Head CT  History: '10 Myocardial Perfusion Study-ST depression laterally with exercise, inferior wall ischemia on images EF=64%>Cath: Severe 3 Vessel CAD, EF=65%, treat medically  Cardiac Risk Factors: Family History - CAD, Hypertension and Lipids  Symptoms: Dizziness, Syncope preceded by lightheadedness and hot sensation  Nuclear Pre-Procedure  Caffeine/Decaff Intake: None > 12 hrs  NPO After: 9:00pm   Lungs: clear  O2 Sat: 98% on room air.  IV 0.9% NS with Angio Cath: 20g   IV Site: R Antecubital x 1, tolerated well  IV Started by: Nathan Hong, RN   Chest Size  (in): 44  Cup Size: n/a   Height: 5\' 5"  (1.651 m)  Weight: 192 lb (87.091 kg)   BMI: Body mass index is 31.95 kg/(m^2).  Tech Comments: Last Benicar 24 hrs ago per patient   Nuclear Med Study  1 or 2 day study: 1 day  Stress Test Type: Lexiscan   Reading MD: Nathan Ancona, MD  Order Authorizing Provider: Charlton Haws, MD   Resting Radionuclide: Technetium 27m Sestamibi  Resting Radionuclide Dose: 11.0 mCi   Stress Radionuclide: Technetium 44m Sestamibi  Stress Radionuclide Dose: 33.0 mCi   Stress Protocol  Rest HR: 59  Stress HR: 115   Rest BP: 119/63  Stress BP: 131/60   Exercise Time (min): n/a  METS: n/a   Predicted Max HR: 173 bpm  % Max HR: 66.47 bpm  Rate Pressure Product: 40981  Dose of Adenosine (mg): n/a  Dose of Lexiscan: 0.4 mg   Dose of Atropine (mg): n/a  Dose of Dobutamine: n/a mcg/kg/min (at max HR)   Stress Test Technologist: Nathan Hong, RN  Nuclear Technologist: Nathan Barrett, CNMT   Rest Procedure: Myocardial perfusion imaging was performed at rest 45 minutes following the intravenous administration of Technetium 86m Sestamibi.  Rest ECG: NSR - Normal EKG  Stress Procedure: The patient attempted to walk the treadmill utilizing the Bruce Protocol for 10:15 minutes, but was unable to reach target heart rate due to sudden onset of chest pressure and tightness,7/10, with the patient stating he needed to stop. The patient had EKG changes. The treadmill was stopped without the injection of cardiolite,and the patient was recovered. Ok was given by Dr. Peter Barrett to do sitting Lexiscan per Nathan Barrett, RT-N.  The patient received IV Lexiscan 0.4 mg over 15-seconds. Technetium 74m Sestamibi injected at 30-seconds. The patient complained of SOB, nausea, and tingling all over body, but denied chest pain. Quantitative spect images were obtained after a 45 minute delay.  Stress ECG: Significant ST abnormalities consistent with ischemia.  QPS  Raw Data Images: Normal; no motion  artifact; normal heart/lung ratio.  Stress Images: Medium-sized, mild perfusion defect throughout the inferior wall.  Rest Images: Normal homogeneous uptake in all areas of the myocardium.  Subtraction (SDS): Reversible inferior perfusion defect.  Transient Ischemic Dilatation (Normal <1.22): 1.12  Lung/Heart Ratio (Normal <0.45): 0.30  Quantitative Gated Spect Images  QGS EDV: 115 ml  QGS ESV: 47 ml  Impression  Exercise Capacity: Initially exercised 10:15 seconds but did not reach 85% MPHR. He had to stop due to chest pain. Lexiscan was infused after consultation with  Dr. [place].  BP Response: Normal blood pressure response.  Clinical Symptoms: Nausea, dyspnea with Lexiscan, chest pain with exercise.  ECG Impression: 1-2 mm horizontal ST depression in V4-V6 at peak exercise, resolving rapidly in recovery.  Comparison with Prior Nuclear Study: Similar to report of prior nuclear study.  Overall Impression: Intermediate stress nuclear study. There were ECG changes suggestive of ischemia along with chest pain on the treadmill, though ECG changes resolved rapidly in recovery and he had good exercise tolerance. There was a medium-sized, mild intensity reversible inferior perfusion defect. This study suggests ischemia.  LV Ejection Fraction: 60%. LV Wall Motion: NL LV Function; NL Wall Motion  Nathan Barrett  04/02/2012   Assessment / Plan: 1. Coronary disease. I reviewed his prior cardiac catheterization data from 2010. He does have severe distal vessel disease with occlusion of the distal LAD which wraps around the apex. There is also occlusion of the posterior lateral branch of the right coronary and the distal PDA. There is a tiny distal circumflex vessel that is also occluded. None of these vessels are amenable for PCI. His recent stress test is unchanged with the exception that his exercise tolerance had dropped a little bit. He really has no significant anginal symptoms. I recommend continued  risk factor modification.  2. Syncope. His symptoms sound vasovagal. We will complete his workup with an echocardiogram. If his symptoms should occur more frequently we would consider an event monitor. I recommended that he maintain good hydration and recognized the early warning symptoms and lie down as soon as possible.  3. Hypertension, controlled.  4. Hypercholesterolemia on Crestor.

## 2012-04-09 NOTE — Patient Instructions (Signed)
We will get an Echocardiogram  I will see you in 6 months.

## 2012-04-17 ENCOUNTER — Other Ambulatory Visit (HOSPITAL_COMMUNITY): Payer: BC Managed Care – PPO

## 2012-04-23 ENCOUNTER — Telehealth: Payer: Self-pay | Admitting: *Deleted

## 2012-04-23 NOTE — Telephone Encounter (Signed)
Left message for Nathan Barrett to reschedule echo due to bad weather.

## 2012-04-28 NOTE — Telephone Encounter (Signed)
Spoke to patient he stated he would reschedule echo.Message sent to schedulers to reschedule.Call patient on his cell phone [phone].

## 2012-04-29 NOTE — Telephone Encounter (Signed)
Echo scheduled for 05/07/12 @ 4 pm.

## 2012-05-07 ENCOUNTER — Ambulatory Visit (HOSPITAL_COMMUNITY): Payer: BC Managed Care – PPO | Attending: Cardiology

## 2012-05-07 DIAGNOSIS — I2510 Atherosclerotic heart disease of native coronary artery without angina pectoris: Secondary | ICD-10-CM | POA: Insufficient documentation

## 2012-05-07 DIAGNOSIS — I10 Essential (primary) hypertension: Secondary | ICD-10-CM | POA: Insufficient documentation

## 2012-05-07 DIAGNOSIS — I359 Nonrheumatic aortic valve disorder, unspecified: Secondary | ICD-10-CM | POA: Insufficient documentation

## 2012-05-07 DIAGNOSIS — I350 Nonrheumatic aortic (valve) stenosis: Secondary | ICD-10-CM

## 2012-05-07 DIAGNOSIS — R55 Syncope and collapse: Secondary | ICD-10-CM | POA: Insufficient documentation

## 2012-05-07 DIAGNOSIS — E785 Hyperlipidemia, unspecified: Secondary | ICD-10-CM | POA: Insufficient documentation

## 2012-05-07 NOTE — Progress Notes (Signed)
Echocardiogram performed.  

## 2012-06-14 ENCOUNTER — Other Ambulatory Visit: Payer: Self-pay | Admitting: Cardiology

## 2012-12-17 ENCOUNTER — Other Ambulatory Visit: Payer: Self-pay

## 2013-03-09 ENCOUNTER — Other Ambulatory Visit: Payer: Self-pay

## 2013-03-09 MED ORDER — OLMESARTAN MEDOXOMIL-HCTZ 40-25 MG PO TABS: 1.0000 | ORAL_TABLET | Freq: Every day | ORAL | Status: DC

## 2013-10-04 ENCOUNTER — Other Ambulatory Visit: Payer: Self-pay | Admitting: Cardiology

## 2013-11-01 ENCOUNTER — Other Ambulatory Visit: Payer: Self-pay | Admitting: Cardiology

## 2014-05-12 ENCOUNTER — Encounter (HOSPITAL_COMMUNITY)
Admission: RE | Admit: 2014-05-12 | Discharge: 2014-05-12 | Disposition: A | Discharge status: Home / Self Care | Payer: BLUE CROSS/BLUE SHIELD | Source: Ambulatory Visit | Attending: Cardiology | Admitting: Cardiology

## 2014-05-12 NOTE — Progress Notes (Signed)
Cardiac Rehab Medication Review by a Pharmacist  Does the patient  feel that his/her medications are working for him/her?  yes  Has the patient been experiencing any side effects to the medications prescribed?  Yes - ranexa - constipation  Does the patient measure his/her own blood pressure or blood glucose at home?  yes - BP  Does the patient have any problems obtaining medications due to transportation or finances?   no  Understanding of regimen: excellent Understanding of indications: excellent Potential of compliance: good - skips one dose of ranexa but says this is his choice    Pharmacist comments: Patient reports no major issues. Some constipation with ranexa and chooses to take medication only once daily. No other issues. Good compliance and excellent understanding.  Elicia Lamp, PharmD Clinical Pharmacist - Resident Pager [phone] 05/12/2014 9:11 AM

## 2014-05-16 ENCOUNTER — Encounter (HOSPITAL_COMMUNITY): Payer: BLUE CROSS/BLUE SHIELD

## 2014-05-16 ENCOUNTER — Telehealth (HOSPITAL_COMMUNITY): Payer: Self-pay | Admitting: *Deleted

## 2014-05-18 ENCOUNTER — Encounter (HOSPITAL_COMMUNITY)
Admission: RE | Admit: 2014-05-18 | Discharge: 2014-05-18 | Disposition: A | Discharge status: Home / Self Care | Payer: BLUE CROSS/BLUE SHIELD | Source: Ambulatory Visit | Attending: Cardiology | Admitting: Cardiology

## 2014-05-18 DIAGNOSIS — I209 Angina pectoris, unspecified: Secondary | ICD-10-CM | POA: Diagnosis present

## 2014-05-18 NOTE — Progress Notes (Signed)
Pt in today for his first day of exercise in the 6:45 cardiac rehab phase II program. Due to pt's work schedule he will attend Cardiac rehab on Mondays and Fridays at 2:45 and Wednesdays at 6:45.  Pt tolerated exercise with no complaints. Monitor showed SR with no ectopy.  Pt did have some bradycardia during cool down with HR of 46/asymptomatic. 12 lead EKG from [facility] 01/2014 shows HR of 48. Pt is not presently taking any beta blockers.  Will fax copy of rehab strip along with first day report to Dr. Marchia Bond for review.  Medication list reconciled. Pt verbalizes compliance with his medications and denies any barriers.  Noted that pt does not have NTG ordered as prn for CP.  Will also include this in the note sent to Dr Burgess Amor for review.  Psychological Assessment PHQ2 score 0.  Pt laughed when questioned about any depressive symptoms he may have experience.  Pt replied that he was "good" and has the support of his wife. No needs identified at this time.  Will periodically check back in with pt to assess his continued mental well-being.  Short term goal pt would like to lose weight.  Pt did not have an amount that he would like to lose, he would like to be able to lose "some" weight.  This has been difficult for pt to do recently with his new medication regimen.  Pt has not started back with exercising based on the advice from his MD. Encouraged pt to begin light walking and to stop and rest if he should develop chest pain.  Pt readily admits that he tends to push himself hard at the gym on the treadmill and free weights. Pt "pushes through the pain" and prefers not to take ntg. Advised pt that to achieve heart benefit he should engage in activities that are fairly light to somewhat hard.  Verbalized understanding.  Short term goal is to have better nutrition.  Pt given schedule of nutritional classes that are offered on tuesdays.  Pt feels his work schedule will accommodate him attending the two nutrition  classes.  Long term goals is to achieve a weight of 180 pounds and to be able to be more active. Pt attended the exercising on your own class. Will periodically check in with pt regarding home exercise.  Pt will have the opportunity to meet with the exercise specialist to discuss home exercise in more detail.  Will monitor pt progressions toward meeting these goals. Cherre Huger, BSN

## 2014-05-20 ENCOUNTER — Encounter (HOSPITAL_COMMUNITY)
Admission: RE | Admit: 2014-05-20 | Discharge: 2014-05-20 | Disposition: A | Discharge status: Home / Self Care | Payer: BLUE CROSS/BLUE SHIELD | Source: Ambulatory Visit | Attending: Cardiology | Admitting: Cardiology

## 2014-05-20 DIAGNOSIS — I209 Angina pectoris, unspecified: Secondary | ICD-10-CM | POA: Diagnosis not present

## 2014-05-23 ENCOUNTER — Encounter (HOSPITAL_COMMUNITY)
Admission: RE | Admit: 2014-05-23 | Discharge: 2014-05-23 | Disposition: A | Discharge status: Home / Self Care | Payer: BLUE CROSS/BLUE SHIELD | Source: Ambulatory Visit | Attending: Cardiology | Admitting: Cardiology

## 2014-05-23 DIAGNOSIS — I209 Angina pectoris, unspecified: Secondary | ICD-10-CM | POA: Diagnosis not present

## 2014-05-25 ENCOUNTER — Encounter (HOSPITAL_COMMUNITY)
Admission: RE | Admit: 2014-05-25 | Discharge: 2014-05-25 | Disposition: A | Discharge status: Home / Self Care | Payer: BLUE CROSS/BLUE SHIELD | Source: Ambulatory Visit | Attending: Cardiology | Admitting: Cardiology

## 2014-05-25 ENCOUNTER — Encounter (HOSPITAL_COMMUNITY): Payer: BLUE CROSS/BLUE SHIELD

## 2014-05-25 DIAGNOSIS — I209 Angina pectoris, unspecified: Secondary | ICD-10-CM | POA: Diagnosis not present

## 2014-05-27 ENCOUNTER — Encounter (HOSPITAL_COMMUNITY)
Admission: RE | Admit: 2014-05-27 | Discharge: 2014-05-27 | Disposition: A | Discharge status: Home / Self Care | Payer: BLUE CROSS/BLUE SHIELD | Source: Ambulatory Visit | Attending: Cardiology | Admitting: Cardiology

## 2014-05-27 DIAGNOSIS — I209 Angina pectoris, unspecified: Secondary | ICD-10-CM | POA: Diagnosis not present

## 2014-05-30 ENCOUNTER — Encounter (HOSPITAL_COMMUNITY)
Admission: RE | Admit: 2014-05-30 | Discharge: 2014-05-30 | Disposition: A | Discharge status: Home / Self Care | Payer: BLUE CROSS/BLUE SHIELD | Source: Ambulatory Visit | Attending: Cardiology | Admitting: Cardiology

## 2014-05-30 DIAGNOSIS — I209 Angina pectoris, unspecified: Secondary | ICD-10-CM | POA: Diagnosis not present

## 2014-05-31 NOTE — Progress Notes (Signed)
Reviewed Nathan Barrett's quality of life questionnaire. Nathan Barrett scored low in the health and functioning aspect of his quality of life questionnaire. Nathan Barrett reports still feeling tired at times. Nathan Barrett is enjoying exercising at cardiac rehab and hope to loose weight. Nathan Barrett has lost about 3 pounds since beginning exercise at cardiac rehab on 05/18/14. Will forward quality of life questionnaire to Dr Chaney Born office for review. Patient continues to do well with exercise at cardiac rehab.

## 2014-06-01 ENCOUNTER — Encounter (HOSPITAL_COMMUNITY): Payer: BLUE CROSS/BLUE SHIELD

## 2014-06-03 ENCOUNTER — Encounter (HOSPITAL_COMMUNITY)
Admission: RE | Admit: 2014-06-03 | Discharge: 2014-06-03 | Disposition: A | Discharge status: Home / Self Care | Payer: BLUE CROSS/BLUE SHIELD | Source: Ambulatory Visit | Attending: Cardiology | Admitting: Cardiology

## 2014-06-03 DIAGNOSIS — I209 Angina pectoris, unspecified: Secondary | ICD-10-CM | POA: Diagnosis not present

## 2014-06-06 ENCOUNTER — Encounter (HOSPITAL_COMMUNITY)
Admission: RE | Admit: 2014-06-06 | Discharge: 2014-06-06 | Disposition: A | Discharge status: Home / Self Care | Payer: BLUE CROSS/BLUE SHIELD | Source: Ambulatory Visit | Attending: Cardiology | Admitting: Cardiology

## 2014-06-06 DIAGNOSIS — I209 Angina pectoris, unspecified: Secondary | ICD-10-CM | POA: Diagnosis not present

## 2014-06-08 ENCOUNTER — Encounter (HOSPITAL_COMMUNITY): Payer: BLUE CROSS/BLUE SHIELD

## 2014-06-10 ENCOUNTER — Encounter (HOSPITAL_COMMUNITY): Payer: BLUE CROSS/BLUE SHIELD

## 2014-06-13 ENCOUNTER — Encounter (HOSPITAL_COMMUNITY)
Admission: RE | Admit: 2014-06-13 | Discharge: 2014-06-13 | Disposition: A | Discharge status: Home / Self Care | Payer: BLUE CROSS/BLUE SHIELD | Source: Ambulatory Visit | Attending: Cardiology | Admitting: Cardiology

## 2014-06-13 DIAGNOSIS — I10 Essential (primary) hypertension: Secondary | ICD-10-CM | POA: Insufficient documentation

## 2014-06-13 DIAGNOSIS — I2510 Atherosclerotic heart disease of native coronary artery without angina pectoris: Secondary | ICD-10-CM | POA: Diagnosis not present

## 2014-06-13 DIAGNOSIS — I350 Nonrheumatic aortic (valve) stenosis: Secondary | ICD-10-CM | POA: Insufficient documentation

## 2014-06-15 ENCOUNTER — Encounter (HOSPITAL_COMMUNITY): Payer: BLUE CROSS/BLUE SHIELD

## 2014-06-15 ENCOUNTER — Encounter (HOSPITAL_COMMUNITY)
Admission: RE | Admit: 2014-06-15 | Discharge: 2014-06-15 | Disposition: A | Discharge status: Home / Self Care | Payer: BLUE CROSS/BLUE SHIELD | Source: Ambulatory Visit | Attending: Cardiology | Admitting: Cardiology

## 2014-06-15 DIAGNOSIS — I2510 Atherosclerotic heart disease of native coronary artery without angina pectoris: Secondary | ICD-10-CM | POA: Diagnosis not present

## 2014-06-17 ENCOUNTER — Encounter (HOSPITAL_COMMUNITY)
Admission: RE | Admit: 2014-06-17 | Discharge: 2014-06-17 | Disposition: A | Discharge status: Home / Self Care | Payer: BLUE CROSS/BLUE SHIELD | Source: Ambulatory Visit | Attending: Cardiology | Admitting: Cardiology

## 2014-06-17 DIAGNOSIS — I2510 Atherosclerotic heart disease of native coronary artery without angina pectoris: Secondary | ICD-10-CM | POA: Diagnosis not present

## 2014-06-20 ENCOUNTER — Encounter (HOSPITAL_COMMUNITY)
Admission: RE | Admit: 2014-06-20 | Discharge: 2014-06-20 | Disposition: A | Discharge status: Home / Self Care | Payer: BLUE CROSS/BLUE SHIELD | Source: Ambulatory Visit | Attending: Cardiology | Admitting: Cardiology

## 2014-06-20 DIAGNOSIS — I2510 Atherosclerotic heart disease of native coronary artery without angina pectoris: Secondary | ICD-10-CM | POA: Diagnosis not present

## 2014-06-22 ENCOUNTER — Encounter (HOSPITAL_COMMUNITY): Payer: BLUE CROSS/BLUE SHIELD

## 2014-06-24 ENCOUNTER — Encounter (HOSPITAL_COMMUNITY)
Admission: RE | Admit: 2014-06-24 | Discharge: 2014-06-24 | Disposition: A | Discharge status: Home / Self Care | Payer: BLUE CROSS/BLUE SHIELD | Source: Ambulatory Visit | Attending: Cardiology | Admitting: Cardiology

## 2014-06-24 DIAGNOSIS — I2510 Atherosclerotic heart disease of native coronary artery without angina pectoris: Secondary | ICD-10-CM | POA: Diagnosis not present

## 2014-06-27 ENCOUNTER — Encounter (HOSPITAL_COMMUNITY)
Admission: RE | Admit: 2014-06-27 | Discharge: 2014-06-27 | Disposition: A | Discharge status: Home / Self Care | Payer: BLUE CROSS/BLUE SHIELD | Source: Ambulatory Visit | Attending: Cardiology | Admitting: Cardiology

## 2014-06-27 DIAGNOSIS — I2510 Atherosclerotic heart disease of native coronary artery without angina pectoris: Secondary | ICD-10-CM | POA: Diagnosis not present

## 2014-06-29 ENCOUNTER — Encounter (HOSPITAL_COMMUNITY)
Admission: RE | Admit: 2014-06-29 | Discharge: 2014-06-29 | Disposition: A | Discharge status: Home / Self Care | Payer: BLUE CROSS/BLUE SHIELD | Source: Ambulatory Visit | Attending: Cardiology | Admitting: Cardiology

## 2014-06-29 ENCOUNTER — Encounter (HOSPITAL_COMMUNITY): Payer: BLUE CROSS/BLUE SHIELD

## 2014-06-29 DIAGNOSIS — I2510 Atherosclerotic heart disease of native coronary artery without angina pectoris: Secondary | ICD-10-CM | POA: Diagnosis not present

## 2014-07-01 ENCOUNTER — Encounter (HOSPITAL_COMMUNITY)
Admission: RE | Admit: 2014-07-01 | Discharge: 2014-07-01 | Disposition: A | Discharge status: Home / Self Care | Payer: BLUE CROSS/BLUE SHIELD | Source: Ambulatory Visit | Attending: Cardiology | Admitting: Cardiology

## 2014-07-01 DIAGNOSIS — I2510 Atherosclerotic heart disease of native coronary artery without angina pectoris: Secondary | ICD-10-CM | POA: Diagnosis not present

## 2014-07-04 ENCOUNTER — Encounter (HOSPITAL_COMMUNITY): Payer: BLUE CROSS/BLUE SHIELD

## 2014-07-06 ENCOUNTER — Encounter (HOSPITAL_COMMUNITY): Payer: BLUE CROSS/BLUE SHIELD

## 2014-07-06 ENCOUNTER — Encounter (HOSPITAL_COMMUNITY)
Admission: RE | Admit: 2014-07-06 | Discharge: 2014-07-06 | Disposition: A | Discharge status: Home / Self Care | Payer: BLUE CROSS/BLUE SHIELD | Source: Ambulatory Visit | Attending: Cardiology | Admitting: Cardiology

## 2014-07-06 DIAGNOSIS — I2510 Atherosclerotic heart disease of native coronary artery without angina pectoris: Secondary | ICD-10-CM | POA: Diagnosis not present

## 2014-07-06 NOTE — Progress Notes (Signed)
Nathan Barrett 49 y.o. male Nutrition Note Spoke with pt.  Nutrition Survey reviewed with pt. Pt is following Step 1 of the Therapeutic Lifestyle Changes diet. Pt has made several changes to his diet since his heart event (e.g. Eating less fried food, ice cream, and desserts). Pt states he struggles with eating more vegetables. Ways to incorporate more vegetables in pt diet discussed. Pt reports he has difficulty with "not eating what I like so I'll just not eat." Pt is not a "big breakfast person" and "since I can't eat Frosted Flakes, I don't eat anything." Healthy breakfast choices that pt enjoys discussed. Pt expressed understanding of the information reviewed. Pt aware of nutrition education classes offered and is unable to attend nutrition classes . No results found for: HGBA1C Wt Readings from Last 3 Encounters:  05/12/14 205 lb 14.6 oz (93.4 kg)  04/09/12 192 lb 1.9 oz (87.145 kg)  04/02/12 192 lb (87.091 kg)   Nutrition Diagnosis ? Food-and nutrition-related knowledge deficit related to lack of exposure to information as related to diagnosis of: ? CVD  ? Obesity related to excessive energy intake as evidenced by a BMI of 32.7  Nutrition Intervention ? Benefits of adopting Therapeutic Lifestyle Changes discussed when Medficts reviewed. ? Pt to attend the Portion Distortion class ? Pt given handouts for: ? Nutrition I class ? Nutrition II class ? Continue client-centered nutrition education by RD, as part of interdisciplinary care.  Goal(s) ? Pt to eat a variety of non-starchy vegetables. ? Pt to eat more fruit. ? Pt to eat whole grains for breakfast. ? Pt to identify and limit food sources of saturated fat, trans fat, and cholesterol ? Pt to identify food quantities necessary to achieve: ? wt loss to a goal wt of 182-200 lb (82.5-90.7 kg) at graduation from cardiac rehab.   Monitor and Evaluate progress toward nutrition goal with team.  Derek Mound, M.Ed, RD, LDN, CDE 07/06/2014  4:05 PM

## 2014-07-08 ENCOUNTER — Encounter (HOSPITAL_COMMUNITY)
Admission: RE | Admit: 2014-07-08 | Discharge: 2014-07-08 | Disposition: A | Discharge status: Home / Self Care | Payer: BLUE CROSS/BLUE SHIELD | Source: Ambulatory Visit | Attending: Cardiology | Admitting: Cardiology

## 2014-07-08 DIAGNOSIS — I2510 Atherosclerotic heart disease of native coronary artery without angina pectoris: Secondary | ICD-10-CM | POA: Diagnosis not present

## 2014-07-11 ENCOUNTER — Encounter (HOSPITAL_COMMUNITY): Payer: BLUE CROSS/BLUE SHIELD

## 2014-07-13 ENCOUNTER — Encounter (HOSPITAL_COMMUNITY): Payer: BLUE CROSS/BLUE SHIELD

## 2014-07-15 ENCOUNTER — Encounter (HOSPITAL_COMMUNITY)
Admission: RE | Admit: 2014-07-15 | Discharge: 2014-07-15 | Disposition: A | Discharge status: Home / Self Care | Payer: BLUE CROSS/BLUE SHIELD | Source: Ambulatory Visit | Attending: Cardiology | Admitting: Cardiology

## 2014-07-15 DIAGNOSIS — I10 Essential (primary) hypertension: Secondary | ICD-10-CM | POA: Insufficient documentation

## 2014-07-15 DIAGNOSIS — I350 Nonrheumatic aortic (valve) stenosis: Secondary | ICD-10-CM | POA: Insufficient documentation

## 2014-07-15 DIAGNOSIS — I2510 Atherosclerotic heart disease of native coronary artery without angina pectoris: Secondary | ICD-10-CM | POA: Diagnosis not present

## 2014-07-18 ENCOUNTER — Encounter (HOSPITAL_COMMUNITY)
Admission: RE | Admit: 2014-07-18 | Discharge: 2014-07-18 | Disposition: A | Discharge status: Home / Self Care | Payer: BLUE CROSS/BLUE SHIELD | Source: Ambulatory Visit | Attending: Cardiology | Admitting: Cardiology

## 2014-07-18 DIAGNOSIS — I2510 Atherosclerotic heart disease of native coronary artery without angina pectoris: Secondary | ICD-10-CM | POA: Diagnosis not present

## 2014-07-20 ENCOUNTER — Encounter (HOSPITAL_COMMUNITY): Payer: BLUE CROSS/BLUE SHIELD

## 2014-07-22 ENCOUNTER — Encounter (HOSPITAL_COMMUNITY)
Admission: RE | Admit: 2014-07-22 | Discharge: 2014-07-22 | Disposition: A | Discharge status: Home / Self Care | Payer: BLUE CROSS/BLUE SHIELD | Source: Ambulatory Visit | Attending: Cardiology | Admitting: Cardiology

## 2014-07-22 DIAGNOSIS — I2510 Atherosclerotic heart disease of native coronary artery without angina pectoris: Secondary | ICD-10-CM | POA: Diagnosis not present

## 2014-07-25 ENCOUNTER — Encounter (HOSPITAL_COMMUNITY): Payer: BLUE CROSS/BLUE SHIELD

## 2014-07-27 ENCOUNTER — Encounter (HOSPITAL_COMMUNITY): Payer: BLUE CROSS/BLUE SHIELD

## 2014-07-29 ENCOUNTER — Encounter (HOSPITAL_COMMUNITY): Payer: BLUE CROSS/BLUE SHIELD

## 2014-08-01 ENCOUNTER — Encounter (HOSPITAL_COMMUNITY): Payer: BLUE CROSS/BLUE SHIELD

## 2014-08-03 ENCOUNTER — Encounter (HOSPITAL_COMMUNITY): Payer: BLUE CROSS/BLUE SHIELD

## 2014-08-05 ENCOUNTER — Encounter (HOSPITAL_COMMUNITY): Payer: BLUE CROSS/BLUE SHIELD

## 2014-08-08 ENCOUNTER — Encounter (HOSPITAL_COMMUNITY): Payer: BLUE CROSS/BLUE SHIELD

## 2014-08-10 ENCOUNTER — Encounter (HOSPITAL_COMMUNITY): Admission: RE | Admit: 2014-08-10 | Payer: BLUE CROSS/BLUE SHIELD | Source: Ambulatory Visit

## 2014-08-12 ENCOUNTER — Encounter (HOSPITAL_COMMUNITY): Payer: BLUE CROSS/BLUE SHIELD

## 2014-08-17 ENCOUNTER — Encounter (HOSPITAL_COMMUNITY)
Admission: RE | Admit: 2014-08-17 | Discharge: 2014-08-17 | Disposition: A | Discharge status: Home / Self Care | Payer: BLUE CROSS/BLUE SHIELD | Source: Ambulatory Visit | Attending: Cardiology | Admitting: Cardiology

## 2014-08-17 DIAGNOSIS — I2510 Atherosclerotic heart disease of native coronary artery without angina pectoris: Secondary | ICD-10-CM | POA: Insufficient documentation

## 2014-08-17 DIAGNOSIS — I10 Essential (primary) hypertension: Secondary | ICD-10-CM | POA: Insufficient documentation

## 2014-08-17 DIAGNOSIS — I350 Nonrheumatic aortic (valve) stenosis: Secondary | ICD-10-CM | POA: Insufficient documentation

## 2014-08-17 NOTE — Progress Notes (Signed)
  30 day Psychosocial followup assessment  Patient psychosocial assessment reveals no barriers to cardiac rehab participation.  Pt recently returned from a extended vacation to [place] which he enjoyed and had a great time with his family.  Pt is eager to finish his time in cardiac rehab and enjoys the high intensity interval training. Pt reports his feeling tired has improved and he is able to perform activities/duties at work and at home.  Pt continues to loose weight and feels good about his progress. Continue to monitor his progress toward his goals. Cherre Huger, BSN

## 2014-08-19 ENCOUNTER — Encounter (HOSPITAL_COMMUNITY)
Admission: RE | Admit: 2014-08-19 | Discharge: 2014-08-19 | Disposition: A | Discharge status: Home / Self Care | Payer: BLUE CROSS/BLUE SHIELD | Source: Ambulatory Visit | Attending: Cardiology | Admitting: Cardiology

## 2014-08-19 DIAGNOSIS — I2510 Atherosclerotic heart disease of native coronary artery without angina pectoris: Secondary | ICD-10-CM | POA: Diagnosis not present

## 2014-08-22 ENCOUNTER — Encounter (HOSPITAL_COMMUNITY): Payer: BLUE CROSS/BLUE SHIELD

## 2014-08-24 ENCOUNTER — Encounter (HOSPITAL_COMMUNITY)
Admission: RE | Admit: 2014-08-24 | Discharge: 2014-08-24 | Disposition: A | Discharge status: Home / Self Care | Payer: BLUE CROSS/BLUE SHIELD | Source: Ambulatory Visit | Attending: Cardiology | Admitting: Cardiology

## 2014-08-24 DIAGNOSIS — I2510 Atherosclerotic heart disease of native coronary artery without angina pectoris: Secondary | ICD-10-CM | POA: Diagnosis not present

## 2014-08-26 ENCOUNTER — Encounter (HOSPITAL_COMMUNITY): Payer: BLUE CROSS/BLUE SHIELD

## 2014-08-29 ENCOUNTER — Encounter (HOSPITAL_COMMUNITY)
Admission: RE | Admit: 2014-08-29 | Discharge: 2014-08-29 | Disposition: A | Discharge status: Home / Self Care | Payer: BLUE CROSS/BLUE SHIELD | Source: Ambulatory Visit | Attending: Cardiology | Admitting: Cardiology

## 2014-08-29 DIAGNOSIS — I2510 Atherosclerotic heart disease of native coronary artery without angina pectoris: Secondary | ICD-10-CM | POA: Diagnosis not present

## 2014-08-31 ENCOUNTER — Encounter (HOSPITAL_COMMUNITY)
Admission: RE | Admit: 2014-08-31 | Discharge: 2014-08-31 | Disposition: A | Discharge status: Home / Self Care | Payer: BLUE CROSS/BLUE SHIELD | Source: Ambulatory Visit | Attending: Cardiology | Admitting: Cardiology

## 2014-08-31 DIAGNOSIS — I2510 Atherosclerotic heart disease of native coronary artery without angina pectoris: Secondary | ICD-10-CM | POA: Diagnosis not present

## 2014-09-02 ENCOUNTER — Encounter (HOSPITAL_COMMUNITY): Payer: BLUE CROSS/BLUE SHIELD

## 2014-09-05 ENCOUNTER — Encounter (HOSPITAL_COMMUNITY)
Admission: RE | Admit: 2014-09-05 | Discharge: 2014-09-05 | Disposition: A | Discharge status: Home / Self Care | Payer: BLUE CROSS/BLUE SHIELD | Source: Ambulatory Visit | Attending: Cardiology | Admitting: Cardiology

## 2014-09-05 DIAGNOSIS — I2510 Atherosclerotic heart disease of native coronary artery without angina pectoris: Secondary | ICD-10-CM | POA: Diagnosis not present

## 2014-09-05 NOTE — Progress Notes (Signed)
Pt graduates from cardiac rehab program next Friday with completion of a tentative 32 exercise sessions in Phase II. Pt maintained good attendance and progressed nicely during his participation in rehab as evidenced by increased MET level. [name] said he benefited and enjoyed participating in high intensity interval training.     Medication list reconciled. Repeat  PHQ score- 0 .  Pt has made significant lifestyle changes and should be commended for his success. Pt feels he has achieved his goals during cardiac rehab.   Pt plans to continue exercise at the gym. We are proud of Nathan Barrett's progress.

## 2014-09-07 ENCOUNTER — Encounter (HOSPITAL_COMMUNITY)
Admission: RE | Admit: 2014-09-07 | Discharge: 2014-09-07 | Disposition: A | Discharge status: Home / Self Care | Payer: BLUE CROSS/BLUE SHIELD | Source: Ambulatory Visit | Attending: Cardiology | Admitting: Cardiology

## 2014-09-07 DIAGNOSIS — I2510 Atherosclerotic heart disease of native coronary artery without angina pectoris: Secondary | ICD-10-CM | POA: Diagnosis not present

## 2014-09-08 ENCOUNTER — Encounter: Payer: Self-pay | Admitting: Gastroenterology

## 2014-09-09 ENCOUNTER — Encounter (HOSPITAL_COMMUNITY)
Admission: RE | Admit: 2014-09-09 | Discharge: 2014-09-09 | Disposition: A | Discharge status: Home / Self Care | Payer: BLUE CROSS/BLUE SHIELD | Source: Ambulatory Visit | Attending: Cardiology | Admitting: Cardiology

## 2014-09-09 DIAGNOSIS — I2510 Atherosclerotic heart disease of native coronary artery without angina pectoris: Secondary | ICD-10-CM | POA: Diagnosis not present

## 2014-09-12 ENCOUNTER — Encounter (HOSPITAL_COMMUNITY)
Admission: RE | Admit: 2014-09-12 | Discharge: 2014-09-12 | Disposition: A | Discharge status: Home / Self Care | Payer: BLUE CROSS/BLUE SHIELD | Source: Ambulatory Visit | Attending: Cardiology | Admitting: Cardiology

## 2014-09-12 DIAGNOSIS — I10 Essential (primary) hypertension: Secondary | ICD-10-CM | POA: Insufficient documentation

## 2014-09-12 DIAGNOSIS — I2510 Atherosclerotic heart disease of native coronary artery without angina pectoris: Secondary | ICD-10-CM | POA: Insufficient documentation

## 2014-09-12 DIAGNOSIS — I350 Nonrheumatic aortic (valve) stenosis: Secondary | ICD-10-CM | POA: Diagnosis not present

## 2014-09-14 ENCOUNTER — Encounter (HOSPITAL_COMMUNITY): Payer: BLUE CROSS/BLUE SHIELD

## 2014-09-16 ENCOUNTER — Encounter (HOSPITAL_COMMUNITY)
Admission: RE | Admit: 2014-09-16 | Discharge: 2014-09-16 | Disposition: A | Discharge status: Home / Self Care | Payer: BLUE CROSS/BLUE SHIELD | Source: Ambulatory Visit | Attending: Cardiology | Admitting: Cardiology

## 2014-09-16 DIAGNOSIS — I2510 Atherosclerotic heart disease of native coronary artery without angina pectoris: Secondary | ICD-10-CM | POA: Diagnosis not present

## 2014-11-02 ENCOUNTER — Ambulatory Visit (INDEPENDENT_AMBULATORY_CARE_PROVIDER_SITE_OTHER): Payer: BLUE CROSS/BLUE SHIELD | Admitting: Gastroenterology

## 2014-11-02 ENCOUNTER — Encounter: Payer: Self-pay | Admitting: Gastroenterology

## 2014-11-02 VITALS — BP 114/68 | HR 56 | Ht 65.5 in | Wt 197.4 lb

## 2014-11-02 DIAGNOSIS — R079 Chest pain, unspecified: Secondary | ICD-10-CM | POA: Diagnosis not present

## 2014-11-02 DIAGNOSIS — R1013 Epigastric pain: Secondary | ICD-10-CM | POA: Diagnosis not present

## 2014-11-02 DIAGNOSIS — G8929 Other chronic pain: Secondary | ICD-10-CM | POA: Diagnosis not present

## 2014-11-02 DIAGNOSIS — I25119 Atherosclerotic heart disease of native coronary artery with unspecified angina pectoris: Secondary | ICD-10-CM | POA: Diagnosis not present

## 2014-11-02 MED ORDER — PANTOPRAZOLE SODIUM 40 MG PO TBEC: 40.0000 mg | DELAYED_RELEASE_TABLET | Freq: Every day | ORAL | Status: DC

## 2014-11-02 NOTE — Progress Notes (Signed)
HPI :  49 y/o male here for evaluation of epigastric pain / lower chest pain. He thinks this has been ongoing for 4-5 years or so. He reports the discomfort is exertional, and reliably produced with exertion and relieved with rest. He exerts himself for roughly 5-8 minutes until he has the pain. Pain does not radiate. Pain is not related to eating. He denies heartburn or regurgitation. No nausea or vomiting associated. No dysphagia. He has been followed by cardiology most recently at [facility] - he has severe small vessel disease with reversible defect on nuclear stress test. They treated him for angina with plavix and renexa, and told him he has chronic angina, which has not helped his symptoms and is frustrated by lack of improvement. He is on the regimen as outlined below. He has completed cardiac rehab recently which has not helped his symptoms.  While eating is not related to symptoms, if he has belching it can make the pain go away. When he stops exercising it makes the pain go away. He has never had a prior upper endoscopy.  He reports he is frustrated that his cardiologists are blaming his pain on his heart, however he thinks it is related to his GI tract.    He otherwise has no prior CRC screening. No FH of CRC. No blood in the stools. He has some mild constipation, no diarrhea.    Past Medical History  Diagnosis Date  . Coronary artery disease   . Aortic stenosis     Mild  . Hypercholesterolemia   . Hypertension   . H. pylori infection   . Bradycardia      Past Surgical History  Procedure Laterality Date  . Cardiac catheterization      Ejection Fraction 65%  . Wisdom tooth extraction     Family History  Problem Relation Age of Onset  . Leukemia Mother   . Heart disease Father     CABG  . Obstructive Sleep Apnea Brother    Social History  Substance Use Topics  . Smoking status: Never Smoker   . Smokeless tobacco: Never Used  . Alcohol Use: No   Current  Outpatient Prescriptions  Medication Sig Dispense Refill  . NON FORMULARY daily at 2 PM daily at 2 PM.    . atorvastatin (LIPITOR) 80 MG tablet Take 80 mg by mouth daily.    Marland Kitchen BENICAR HCT 40-25 MG per tablet TAKE 1 TABLET BY MOUTH EVERY DAY 30 tablet 0  . clopidogrel (PLAVIX) 75 MG tablet Take 75 mg by mouth daily.    . ranolazine (RANEXA) 500 MG 12 hr tablet Take 500 mg by mouth daily.     No current facility-administered medications for this visit.   No Known Allergies   Review of Systems: All systems reviewed and negative except where noted in HPI.   No recent labs or imaging on file.   Physical Exam: BP 114/68 mmHg  Pulse 56  Ht 5' 5.5" (1.664 m)  Wt 197 lb 6 oz (89.529 kg)  BMI 32.33 kg/m2 Constitutional: Pleasant,well-developed,  AA male in no acute distress. HEENT: Normocephalic and atraumatic. Conjunctivae are normal. No scleral icterus. Neck supple.  Cardiovascular: Normal rate, regular rhythm.  Pulmonary/chest: Effort normal and breath sounds normal. No wheezing, rales or rhonchi. Abdominal: Soft, nondistended, nontender. Bowel sounds active throughout. There are no masses palpable. No hepatomegaly. Extremities: no edema Lymphadenopathy: No cervical adenopathy noted. Neurological: Alert and oriented to person place and time. Skin: Skin  is warm and dry. No rashes noted. Psychiatric: Normal mood and affect. Behavior is normal.   ASSESSMENT AND PLAN: 49 y/o male with what appears to be severe premature small vessel coronary artery disease with positive reversible defects on prior nuclear stress test, presenting with exertional chest / epigastric pain. He has been on antianginal and antiplatelet therapy which has not helped his symptoms. Based on his reported history and known coronary disease, I suspect he is having anginal symptoms causing this presentation. The patient states he "powers through" the pain when exercising and it eventually goes away, and is convinced it  is not related to his heart. I discussed GI tract pathology which could cause this, but given the exertional nature, I doubt PUD or reflux however offered him an empiric trial of protonix 40mg  daily to see if this helps. I discussed that previously there was concern about potential interaction with PPI and plavix, however the majority of the evidence argues against a clinically significant interaction and is safe for a trial of it. Otherwise, if we were to consider upper endoscopy he would need further evaluation and clearance by his cardiologist. I asked him to follow up with cardiology so they can reassess him and provide their opinion. In the interim, we will see his response to empiric PPI.   Otherwise, he has no prior CRC screening and is overdue for his screening exam. I discussed options such as optical colonoscopy, virtual colonoscopy, and stool testing. Given his plavix use and coronary disease, would recommend virtual colonoscopy initially, however will await his cardiac workup / clearance, and consider optical colonoscopy pending on how he wishes to proceed.   All questions answered, he agreed with the plan.   Beechmont Cellar, MD [facility] G[facility]enterology Pager [phone]

## 2014-11-08 ENCOUNTER — Telehealth: Payer: Self-pay | Admitting: Cardiology

## 2014-11-29 NOTE — Telephone Encounter (Signed)
error 

## 2015-03-28 ENCOUNTER — Encounter: Payer: Self-pay | Admitting: Cardiology

## 2015-03-28 ENCOUNTER — Ambulatory Visit (INDEPENDENT_AMBULATORY_CARE_PROVIDER_SITE_OTHER): Payer: BLUE CROSS/BLUE SHIELD | Admitting: Cardiology

## 2015-03-28 VITALS — BP 120/80 | HR 51 | Ht 65.0 in | Wt 199.6 lb

## 2015-03-28 DIAGNOSIS — I10 Essential (primary) hypertension: Secondary | ICD-10-CM | POA: Diagnosis not present

## 2015-03-28 DIAGNOSIS — E7800 Pure hypercholesterolemia, unspecified: Secondary | ICD-10-CM

## 2015-03-28 DIAGNOSIS — I25118 Atherosclerotic heart disease of native coronary artery with other forms of angina pectoris: Secondary | ICD-10-CM | POA: Diagnosis not present

## 2015-03-28 MED ORDER — CLOPIDOGREL BISULFATE 75 MG PO TABS: 75.0000 mg | ORAL_TABLET | Freq: Every day | ORAL | Status: DC

## 2015-03-28 MED ORDER — AMLODIPINE BESYLATE 5 MG PO TABS: 5.0000 mg | ORAL_TABLET | Freq: Every day | ORAL | Status: DC

## 2015-03-28 MED ORDER — OLMESARTAN MEDOXOMIL-HCTZ 20-12.5 MG PO TABS: 1.0000 | ORAL_TABLET | Freq: Every day | ORAL | Status: DC

## 2015-03-28 MED ORDER — ATORVASTATIN CALCIUM 80 MG PO TABS: 80.0000 mg | ORAL_TABLET | Freq: Every day | ORAL | Status: DC

## 2015-03-28 NOTE — Progress Notes (Addendum)
Cardiology Office Note   Date:  03/28/2015   ID:  Nathan Barrett, DOB Jul 10, 1965, MRN [id number]  PCP:  No PCP Per Patient  Cardiologist:   Zaharah Amir Martinique, MD   Chief Complaint  Patient presents with  . Follow-up    last seen 04/09/12--CAD//pt states no Sx.      History of Present Illness: Nathan Barrett is a 50 y.o. male who presents for evaluation of CAD. He is known to me from prior evaluation in 2010. Last seen in our practice in 2014. Moved and has since been evaluated at [facility]. He has a known history of CAD. Had cardiac cath here in 2010 and 2014 showing extensive small vessel CAD with severe disease in several small terminal branches. Not a candidate for intervention. Was treated with Imdur in the past but this was not continued due to side effects ? HA. Not a candidate for beta blocker due to chronic bradycardia. Was evaluated at [facility] in [facility] 2015. Echo was normal. Stress Myoview was done and he was able to walk 8 minutes but developed 8/10 chest pain. HR only up to 121. Small inferolateral perfusion defect noted. Cardiac cath done. I am unable to open report but no intervention recommended. He was started on Ranexa but notes no change in symptoms. His symptoms are epigastric pain and pressure. This is exertional. With ordinary activity he does not get pain. When he walks more vigorously in his neighborhood or when he works out at [employer] he gets the discomfort. If he continues to walk the pain will improve after about 15-20 minutes. No pain at rest. He was seen by GI who felt these symptoms were cardiac related.  He did have an episode of syncope in 2014 with negative work up at the time.   Past Medical History  Diagnosis Date  . Coronary artery disease   . Aortic stenosis     Mild  . Hypercholesterolemia   . Hypertension   . H. pylori infection   . Bradycardia     Past Surgical History  Procedure Laterality Date  . Cardiac catheterization      Ejection Fraction 65%  .  Wisdom tooth extraction       Current Outpatient Prescriptions  Medication Sig Dispense Refill  . atorvastatin (LIPITOR) 80 MG tablet Take 1 tablet (80 mg total) by mouth daily. 30 tablet 11  . clopidogrel (PLAVIX) 75 MG tablet Take 1 tablet (75 mg total) by mouth daily. 30 tablet 11  . NON FORMULARY daily at 2 PM daily at 2 PM.    . pantoprazole (PROTONIX) 40 MG tablet Take 1 tablet (40 mg total) by mouth daily. 90 tablet 1  . amLODipine (NORVASC) 5 MG tablet Take 1 tablet (5 mg total) by mouth daily. 30 tablet 11  . olmesartan-hydrochlorothiazide (BENICAR HCT) 20-12.5 MG tablet Take 1 tablet by mouth daily. 30 tablet 11   No current facility-administered medications for this visit.    Allergies:   Review of patient's allergies indicates no known allergies.    Social History:  The patient  reports that he has never smoked. He has never used smokeless tobacco. He reports that he does not drink alcohol or use illicit drugs.   Family History:  The patient's family history includes Heart disease in his father; Leukemia in his mother; Obstructive Sleep Apnea in his brother.    ROS:  Please see the history of present illness.   Otherwise, review of systems are positive  for none.   All other systems are reviewed and negative.    PHYSICAL EXAM: VS:  BP 120/80 mmHg  Pulse 51  Ht 5\' 5"  (1.651 m)  Wt 90.538 kg (199 lb 9.6 oz)  BMI 33.22 kg/m2 , BMI Body mass index is 33.22 kg/(m^2). GEN: Well nourished, well developed, in no acute distress HEENT: normal Neck: no JVD, carotid bruits, or masses Cardiac: RRR; no  rubs, or gallops,no edema , soft systolic ejection murmur. Respiratory:  clear to auscultation bilaterally, normal work of breathing GI: soft, nontender, nondistended, + BS MS: no deformity or atrophy Skin: warm and dry, no rash Neuro:  Strength and sensation are intact Psych: euthymic mood, full affect   EKG:  EKG is ordered today. The ekg ordered today demonstrates Sinus  brady with rate 51. Mild repolarization abnormality.   Recent Labs: No results found for requested labs within last 365 days.    Lipid Panel    Component Value Date/Time   CHOL 117 11/23/2010 1015   TRIG 83.0 11/23/2010 1015   HDL 40.50 11/23/2010 1015   CHOLHDL 3 11/23/2010 1015   VLDL 16.6 11/23/2010 1015   LDLCALC 60 11/23/2010 1015      Wt Readings from Last 3 Encounters:  03/28/15 90.538 kg (199 lb 9.6 oz)  11/02/14 89.529 kg (197 lb 6 oz)  05/12/14 93.4 kg (205 lb 14.6 oz)      Other studies Reviewed: Additional studies/ records that were reviewed today include: evaluation at [facility] as noted above. . Review of the above records demonstrates: see above.   ASSESSMENT AND PLAN:  1.  CAD with stable class 2 angina. Patient with known small vessel CAD. Not amenable to PCI or surgery. I will request a copy of his cath films from [facility] and review and compare with prior studies here. He is not a candidate for beta blocker due to bradycardia. Imdur in the past was not tolerated. Ranexa has not resulted in any improvement in symptoms. Will stop Ranexa. Start amlodipine 5 mg daily. Reduce Benicar HCT by half. Continue Plavix and high dose stating. Follow up in 2 months.  2. HTN. With addition of amlodipine will reduce Benicar HCT to 20/12.5 mg to avoid hypotension.   3. Hypercholesterolemia.  Will get fasting chemistries and lipid panel on lipitor.    Current medicines are reviewed at length with the patient today.  The patient does not have concerns regarding medicines.  The following changes have been made:  See above  Labs/ tests ordered today include:  Orders Placed This Encounter  Procedures  . Basic metabolic panel  . Lipid panel  . Hepatic function panel  . EKG 12-Lead     Disposition:   FU with Dr. Martinique  in 2 months  Signed, Nathan Schmuck Martinique, MD  03/28/2015 6:17 PM    [facility] [address] [place], [place], [address] Phone  [phone], Fax [phone]   Addendum: 05/04/15: Cardiac cath films obtained from [facility] dated 12/23/13. I have personally reviewed images.  There is diffuse 50% disease in the proximal and mid LAD. The distal LAD is occluded at the apex.  There is a large ramus intermediate branch with a critical ostial stenosis 99%. There is a 90% stenosis at the bifurcation of the ramus into 2 moderate sized branches. The LCx has 80% ostial stenosis with occlusion of a small distal branch. The RCA has 50% mid vessel disease. The PDA is occluded and there is a 90% stenosis at  the origin of the PLOM.  Findings are similar to cardiac cath in 2010 although the ostial ramus disease has progressed.  Will review with patient of follow up visit.

## 2015-03-28 NOTE — Patient Instructions (Signed)
Stop Ranexa  Reduce Benicar HCT to 20/12.5 mg daily  Add amlodipine 5 mg daily  We will schedule you for fasting lab work  We will get your cath films from [facility]  I will see you in 2 months.

## 2015-03-29 ENCOUNTER — Telehealth: Payer: Self-pay | Admitting: Cardiology

## 2015-03-29 NOTE — Telephone Encounter (Signed)
Faxed signed release to [facility] to obtain records per Dr Martinique.  Faxed to [facility] on 03/29/15 --FAX [phone]. lp

## 2015-03-30 LAB — BASIC METABOLIC PANEL
BUN / CREAT RATIO: 17 (ref 9–20)
BUN: 23 mg/dL (ref 6–24)
CO2: 29 mmol/L (ref 18–29)
CREATININE: 1.38 mg/dL — AB (ref 0.76–1.27)
Calcium: 9.8 mg/dL (ref 8.7–10.2)
Chloride: 103 mmol/L (ref 96–106)
GFR calc non Af Amer: 59 mL/min/{1.73_m2} — ABNORMAL LOW (ref >59)
GFR, EST AFRICAN AMERICAN: 68 mL/min/{1.73_m2} (ref >59)
Glucose: 95 mg/dL (ref 65–99)
Potassium: 3.7 mmol/L (ref 3.5–5.2)
Sodium: 140 mmol/L (ref 134–144)

## 2015-03-30 LAB — HEPATIC FUNCTION PANEL
ALBUMIN: 4.6 g/dL (ref 3.5–5.5)
ALT: 26 IU/L (ref 0–44)
AST: 27 IU/L (ref 0–40)
Alkaline Phosphatase: 121 IU/L — ABNORMAL HIGH (ref 39–117)
BILIRUBIN, DIRECT: 0.11 mg/dL (ref 0.00–0.40)
Bilirubin Total: 0.4 mg/dL (ref 0.0–1.2)
TOTAL PROTEIN: 7.2 g/dL (ref 6.0–8.5)

## 2015-03-31 LAB — LIPID PANEL
Chol/HDL Ratio: 3.7 ratio units (ref 0.0–5.0)
Cholesterol, Total: 137 mg/dL (ref 100–199)
HDL: 37 mg/dL — AB (ref >39)
LDL Calculated: 84 mg/dL (ref 0–99)
Triglycerides: 78 mg/dL (ref 0–149)
VLDL Cholesterol Cal: 16 mg/dL (ref 5–40)

## 2015-06-06 ENCOUNTER — Encounter: Payer: Self-pay | Admitting: Cardiology

## 2015-06-06 ENCOUNTER — Ambulatory Visit (INDEPENDENT_AMBULATORY_CARE_PROVIDER_SITE_OTHER): Payer: BLUE CROSS/BLUE SHIELD | Admitting: Cardiology

## 2015-06-06 VITALS — BP 120/80 | HR 54 | Ht 65.0 in | Wt 204.4 lb

## 2015-06-06 DIAGNOSIS — I25118 Atherosclerotic heart disease of native coronary artery with other forms of angina pectoris: Secondary | ICD-10-CM

## 2015-06-06 DIAGNOSIS — I10 Essential (primary) hypertension: Secondary | ICD-10-CM

## 2015-06-06 DIAGNOSIS — E7800 Pure hypercholesterolemia, unspecified: Secondary | ICD-10-CM

## 2015-06-06 NOTE — Patient Instructions (Signed)
Continue your current therapy  I will see you in 6 months.   

## 2015-06-06 NOTE — Progress Notes (Signed)
Cardiology Office Note   Date:  06/06/2015   ID:  Nathan, Barrett February 05, 1966, MRN [id number]  PCP:  No PCP Per Patient  Cardiologist:   Nathan Martinique, MD   No chief complaint on file.     History of Present Illness: Nathan Barrett is a 50 y.o. male who is seen for follow up  of CAD. He is known to me from prior evaluation in 2010.  He later moved and has since been evaluated at [facility]. He has a known history of CAD. Had cardiac cath here in 2010 and 2014 showing extensive small vessel CAD with severe disease in several small terminal branches. Not a candidate for intervention. Was treated with Imdur in the past but this was not continued due to side effects ? HA. Not a candidate for beta blocker due to chronic bradycardia. Was evaluated at [facility] in [facility] 2015. Echo was normal. Stress Myoview was done and he was able to walk 8 minutes but developed 8/10 chest pain. HR only up to 121. Small inferolateral perfusion defect noted. Cardiac cath done. See my report below. He was started on Ranexa but notes no change in symptoms. When seen in follow up we started him on amlodipine and Ranexa was stopped due to lack of benefit.  His symptoms are epigastric pain and pressure. This is exertional. With ordinary activity he does not get pain. No pain with daily activities. When he walks more vigorously in his neighborhood or when he works out at [employer] he gets the discomfort. If he continues to walk the pain will improve after about 15-20 minutes. No pain at rest. He is tolerating amlodipine well with good BP control.  Past Medical History  Diagnosis Date  . Coronary artery disease   . Aortic stenosis     Mild  . Hypercholesterolemia   . Hypertension   . H. pylori infection   . Bradycardia     Past Surgical History  Procedure Laterality Date  . Cardiac catheterization      Ejection Fraction 65%  . Wisdom tooth extraction       Current Outpatient Prescriptions  Medication Sig Dispense  Refill  . amLODipine (NORVASC) 5 MG tablet Take 1 tablet (5 mg total) by mouth daily. 30 tablet 11  . atorvastatin (LIPITOR) 80 MG tablet Take 1 tablet (80 mg total) by mouth daily. 30 tablet 11  . clopidogrel (PLAVIX) 75 MG tablet Take 1 tablet (75 mg total) by mouth daily. 30 tablet 11  . NON FORMULARY daily at 2 PM daily at 2 PM.    . olmesartan-hydrochlorothiazide (BENICAR HCT) 20-12.5 MG tablet Take 1 tablet by mouth daily. 30 tablet 11  . pantoprazole (PROTONIX) 40 MG tablet Take 1 tablet (40 mg total) by mouth daily. 90 tablet 1   No current facility-administered medications for this visit.    Allergies:   Review of patient's allergies indicates no known allergies.    Social History:  The patient  reports that he has never smoked. He has never used smokeless tobacco. He reports that he does not drink alcohol or use illicit drugs.   Family History:  The patient's family history includes Heart disease in his father; Leukemia in his mother; Obstructive Sleep Apnea in his brother.    ROS:  Please see the history of present illness.   Otherwise, review of systems are positive for none.   All other systems are reviewed and negative.    PHYSICAL EXAM: VS:  There were no vitals taken for this visit. , BMI There is no weight on file to calculate BMI. GEN: Well nourished, well developed, in no acute distress HEENT: normal Neck: no JVD, carotid bruits, or masses Cardiac: RRR; no  rubs, or gallops,no edema , soft systolic ejection murmur. Respiratory:  clear to auscultation bilaterally, normal work of breathing GI: soft, nontender, nondistended, + BS MS: no deformity or atrophy Skin: warm and dry, no rash Neuro:  Strength and sensation are intact Psych: euthymic mood, full affect   EKG:  EKG is not ordered today.    Recent Labs: 03/30/2015: ALT 26; BUN 23; Creatinine, Ser 1.38*; Potassium 3.7; Sodium 140    Lipid Panel    Component Value Date/Time   CHOL 137 03/30/2015 1015     CHOL 117 11/23/2010 1015   TRIG 78 03/30/2015 1015   HDL 37* 03/30/2015 1015   HDL 40.50 11/23/2010 1015   CHOLHDL 3.7 03/30/2015 1015   CHOLHDL 3 11/23/2010 1015   VLDL 16.6 11/23/2010 1015   LDLCALC 84 03/30/2015 1015   LDLCALC 60 11/23/2010 1015      Wt Readings from Last 3 Encounters:  03/28/15 90.538 kg (199 lb 9.6 oz)  11/02/14 89.529 kg (197 lb 6 oz)  05/12/14 93.4 kg (205 lb 14.6 oz)      Other studies Reviewed: Additional studies/ records that were reviewed today include: Cardiac cath films reviewed from [facility] Review of the above records demonstrates:   I have personally reviewed images.  There is diffuse 50% disease in the proximal and mid LAD. The distal LAD is occluded at the apex.  There is a large ramus intermediate branch with a critical ostial stenosis 99%. There is a 90% stenosis at the bifurcation of the ramus into 2 moderate sized branches. The LCx is small and has 80% ostial stenosis with occlusion of a small distal branch. The RCA has 50% mid vessel disease. The PDA is occluded and there is a 90% stenosis at the origin of the PLOM.   ASSESSMENT AND PLAN:  1.  CAD with stable class 2 angina. Patient with known small vessel CAD. I reviewed his recent cath films with him today. Much of his disease involves small distal branches that are not amenable to revascularization. The ramus intermediate has severe ostial and mid vessel disease but PCI would be difficult with ostial location and potential plaque shift. The mid vessel lesion is at a bifurcation. His angiogram is not significantly changed from 2010. Given stable symptoms I have recommended continued medical therapy.  He is not a candidate for beta blocker due to bradycardia. Imdur in the past was not tolerated. Ranexa has not resulted in any improvement in symptoms. Continue  amlodipine 5 mg daily.  Continue Plavix and high dose statin. Follow up in 6 months. If his anginal symptoms progress then we need to  reevaluate.  2. HTN. Well controlled.   3. Hypercholesterolemia.  Satisfactory control on lipitor.    Current medicines are reviewed at length with the patient today.  The patient does not have concerns regarding medicines.  The following changes have been made:  See above  Labs/ tests ordered today include:  No orders of the defined types were placed in this encounter.     Disposition:   FU with Dr. Martinique  in 6  months  Signed, Nathan Martinique, MD  06/06/2015 8:32 AM    [facility] [address] [place], [place], [address] Phone [phone], Fax  [phone]   

## 2015-06-07 ENCOUNTER — Other Ambulatory Visit: Payer: Self-pay

## 2015-06-07 MED ORDER — ATORVASTATIN CALCIUM 80 MG PO TABS: 80.0000 mg | ORAL_TABLET | Freq: Every day | ORAL | Status: DC

## 2015-06-07 MED ORDER — AMLODIPINE BESYLATE 5 MG PO TABS: 5.0000 mg | ORAL_TABLET | Freq: Every day | ORAL | Status: DC

## 2015-06-07 MED ORDER — CLOPIDOGREL BISULFATE 75 MG PO TABS: 75.0000 mg | ORAL_TABLET | Freq: Every day | ORAL | Status: DC

## 2015-06-07 MED ORDER — OLMESARTAN MEDOXOMIL-HCTZ 20-12.5 MG PO TABS: 1.0000 | ORAL_TABLET | Freq: Every day | ORAL | Status: DC

## 2015-06-07 NOTE — Telephone Encounter (Signed)
Rx(s) sent to pharmacy electronically.  

## 2015-08-19 DIAGNOSIS — J029 Acute pharyngitis, unspecified: Secondary | ICD-10-CM | POA: Diagnosis not present

## 2015-08-19 DIAGNOSIS — H6502 Acute serous otitis media, left ear: Secondary | ICD-10-CM | POA: Diagnosis not present

## 2015-08-28 DIAGNOSIS — N529 Male erectile dysfunction, unspecified: Secondary | ICD-10-CM | POA: Diagnosis not present

## 2015-08-28 DIAGNOSIS — Z6831 Body mass index (BMI) 31.0-31.9, adult: Secondary | ICD-10-CM | POA: Diagnosis not present

## 2015-10-03 DIAGNOSIS — N509 Disorder of male genital organs, unspecified: Secondary | ICD-10-CM | POA: Diagnosis not present

## 2015-10-09 DIAGNOSIS — Z6831 Body mass index (BMI) 31.0-31.9, adult: Secondary | ICD-10-CM | POA: Diagnosis not present

## 2015-10-09 DIAGNOSIS — N509 Disorder of male genital organs, unspecified: Secondary | ICD-10-CM | POA: Diagnosis not present

## 2016-01-15 DIAGNOSIS — D230 Other benign neoplasm of skin of lip: Secondary | ICD-10-CM | POA: Diagnosis not present

## 2016-01-31 DIAGNOSIS — D230 Other benign neoplasm of skin of lip: Secondary | ICD-10-CM | POA: Diagnosis not present

## 2016-01-31 DIAGNOSIS — J019 Acute sinusitis, unspecified: Secondary | ICD-10-CM | POA: Diagnosis not present

## 2016-03-02 ENCOUNTER — Encounter (HOSPITAL_COMMUNITY): Payer: Self-pay

## 2016-03-02 ENCOUNTER — Ambulatory Visit (HOSPITAL_COMMUNITY)
Admission: EM | Admit: 2016-03-02 | Discharge: 2016-03-02 | Disposition: A | Discharge status: Home / Self Care | Payer: BLUE CROSS/BLUE SHIELD | Attending: Family Medicine | Admitting: Family Medicine

## 2016-03-02 DIAGNOSIS — R69 Illness, unspecified: Secondary | ICD-10-CM | POA: Diagnosis not present

## 2016-03-02 DIAGNOSIS — J111 Influenza due to unidentified influenza virus with other respiratory manifestations: Secondary | ICD-10-CM

## 2016-03-02 MED ORDER — OSELTAMIVIR PHOSPHATE 75 MG PO CAPS: 75.0000 mg | ORAL_CAPSULE | Freq: Two times a day (BID) | ORAL | 0 refills | Status: DC

## 2016-03-02 MED ORDER — HYDROCOD POLST-CPM POLST ER 10-8 MG/5ML PO SUER: 5.0000 mL | Freq: Two times a day (BID) | ORAL | 0 refills | Status: DC | PRN

## 2016-03-02 NOTE — ED Triage Notes (Signed)
Patient presents to [facility] with possible flu, pt complains of chills and  body aches. Pt has taken OTC medication with no relief

## 2016-03-02 NOTE — Discharge Instructions (Signed)
You appear to have the flu. You should stay to work until Tuesday. Take medicine as directed and if you feel like you're worsening, please return

## 2016-03-02 NOTE — ED Provider Notes (Signed)
[place]    CSN: [id number] Arrival date & time: 03/02/16  1739     History   Chief Complaint Chief Complaint  Patient presents with  . Influenza    HPI Nathan Barrett is a 51 y.o. male.   This a 51 year old gentleman who comes into [facility] urgent care seeking evaluation for symptoms consistent with influenza. For the past 36 hours, patient has been running a fever, coughing, and having rhinorrhea. In addition, he has muscle aches. At times his cough so hard that he almost vomited.  Patient is a Chartered certified accountant for [employer]      Past Medical History:  Diagnosis Date  . Aortic stenosis    Mild  . Bradycardia   . Coronary artery disease   . H. pylori infection   . Hypercholesterolemia   . Hypertension     Patient Active Problem List   Diagnosis Date Noted  . Syncope 04/09/2012  . Coronary artery disease   . Aortic stenosis   . Hypercholesterolemia   . Hypertension     Past Surgical History:  Procedure Laterality Date  . CARDIAC CATHETERIZATION     Ejection Fraction 65%  . WISDOM TOOTH EXTRACTION         Home Medications    Prior to Admission medications   Medication Sig Start Date End Date Taking? Authorizing Provider  amLODipine (NORVASC) 5 MG tablet Take 1 tablet (5 mg total) by mouth daily. 06/07/15  Yes Peter M Martinique, MD  atorvastatin (LIPITOR) 80 MG tablet Take 1 tablet (80 mg total) by mouth daily. 06/07/15  Yes Peter M Martinique, MD  clopidogrel (PLAVIX) 75 MG tablet Take 1 tablet (75 mg total) by mouth daily. 06/07/15  Yes Peter M Martinique, MD  olmesartan-hydrochlorothiazide (BENICAR HCT) 20-12.5 MG tablet Take 1 tablet by mouth daily. 06/07/15  Yes Peter M Martinique, MD  chlorpheniramine-HYDROcodone [facility]) 10-8 MG/5ML SUER Take 5 mLs by mouth every 12 (twelve) hours as needed for cough. 03/02/16   Robyn Haber, MD  NON FORMULARY daily at 2 PM daily at 2 PM.    Historical Provider, MD    oseltamivir (TAMIFLU) 75 MG capsule Take 1 capsule (75 mg total) by mouth every 12 (twelve) hours. 03/02/16   Robyn Haber, MD    Family History Family History  Problem Relation Age of Onset  . Leukemia Mother   . Heart disease Father     CABG  . Obstructive Sleep Apnea Brother     Social History Social History  Substance Use Topics  . Smoking status: Never Smoker  . Smokeless tobacco: Never Used  . Alcohol use No     Allergies   Patient has no known allergies.   Review of Systems Review of Systems  Constitutional: Positive for chills, diaphoresis, fatigue and fever.  HENT: Positive for congestion and rhinorrhea.   Respiratory: Positive for cough.   Gastrointestinal: Negative.   Genitourinary: Negative.   Musculoskeletal: Positive for myalgias.     Physical Exam Triage Vital Signs ED Triage Vitals  Enc Vitals Group     BP 03/02/16 1929 119/74     Pulse Rate 03/02/16 1929 68     Resp 03/02/16 1929 16     Temp 03/02/16 1929 98.9 F (37.2 C)     Temp Source 03/02/16 1929 Oral     SpO2 03/02/16 1929 100 %     Weight --      Height --  Head Circumference --      Peak Flow --      Pain Score 03/02/16 1932 7     Pain Loc --      Pain Edu? --      Excl. in [place]? --    No data found.   Updated Vital Signs BP 119/74 (BP Location: Right Arm)   Pulse 68   Temp 98.9 F (37.2 C) (Oral)   Resp 16   SpO2 100%    Physical Exam  Constitutional: He is oriented to person, place, and time. He appears well-developed and well-nourished.  HENT:  Right Ear: External ear normal.  Left Ear: External ear normal.  Mouth/Throat: Oropharynx is clear and moist.  Eyes: Conjunctivae and EOM are normal.  Neck: Normal range of motion. Neck supple.  Cardiovascular: Normal rate, regular rhythm and normal heart sounds.   Pulmonary/Chest: Effort normal and breath sounds normal.  Musculoskeletal: Normal range of motion.  Neurological: He is alert and oriented to person,  place, and time.  Skin: Skin is warm and dry.  Nursing note and vitals reviewed.    UC Treatments / Results  Labs (all labs ordered are listed, but only abnormal results are displayed) Labs Reviewed - No data to display  EKG  EKG Interpretation None       Radiology No results found.  Procedures Procedures (including critical care time)  Medications Ordered in UC Medications - No data to display   Initial Impression / Assessment and Plan / UC Course  I have reviewed the triage vital signs and the nursing notes.  Pertinent labs & imaging results that were available during my care of the patient were reviewed by me and considered in my medical decision making (see chart for details).     Final Clinical Impressions(s) / UC Diagnoses   Final diagnoses:  Influenza-like illness    New Prescriptions New Prescriptions   CHLORPHENIRAMINE-HYDROCODONE (TUSSIONEX PENNKINETIC ER) 10-8 MG/5ML SUER    Take 5 mLs by mouth every 12 (twelve) hours as needed for cough.   OSELTAMIVIR (TAMIFLU) 75 MG CAPSULE    Take 1 capsule (75 mg total) by mouth every 12 (twelve) hours.     Robyn Haber, MD 03/02/16 [phone]

## 2016-03-02 NOTE — ED Notes (Signed)
Patient discharged by provider Melton Krebs , MD

## 2016-03-29 ENCOUNTER — Telehealth: Payer: Self-pay | Admitting: Cardiology

## 2016-03-29 NOTE — Telephone Encounter (Signed)
Message sent to Dr.Jordan for clearance. 

## 2016-03-29 NOTE — Telephone Encounter (Signed)
He is cleared for surgical excision of lip lesion  Peter Martinique MD, [facility]

## 2016-03-29 NOTE — Progress Notes (Signed)
Cardiology notes reviewed by Dr. Deatra Canter. Pt will need cardiac clearance prior to surgery 04/05/16 with Dr. Iran Planas as well as instructions regarding Plavix.  VM left with Maudie Mercury, surgery scheduler with Dr. Para Skeans office.

## 2016-03-29 NOTE — Telephone Encounter (Signed)
New message   .  Request for surgical clearance:  1. What type of surgery is being performed? exesion of lower lip lesion   2. When is this surgery scheduled? 2/23  3. Are there any medications that need to be held prior to surgery and how long? no   4. Name of physician performing surgery? Dr. Iran Planas  5. What is your office phone and fax number? Phone-[phone] [phone]

## 2016-03-29 NOTE — Telephone Encounter (Signed)
Clearance note faxed to Maudie Mercury at [place] office at fax # [phone].

## 2016-04-01 ENCOUNTER — Encounter (HOSPITAL_BASED_OUTPATIENT_CLINIC_OR_DEPARTMENT_OTHER): Payer: Self-pay | Admitting: *Deleted

## 2016-04-01 NOTE — Progress Notes (Signed)
Cardiac clearance note received from Dr Peter Martinique. Per Maudie Mercury at Dr Para Skeans office, pt does not need to stop plavix. Patient denied any chest pain or SOB during preop call. He will come in for BMET and EKG.

## 2016-04-04 ENCOUNTER — Encounter (HOSPITAL_BASED_OUTPATIENT_CLINIC_OR_DEPARTMENT_OTHER)
Admission: RE | Admit: 2016-04-04 | Discharge: 2016-04-04 | Disposition: A | Discharge status: Home / Self Care | Payer: BLUE CROSS/BLUE SHIELD | Source: Ambulatory Visit | Attending: Plastic Surgery | Admitting: Plastic Surgery

## 2016-04-04 DIAGNOSIS — Z79899 Other long term (current) drug therapy: Secondary | ICD-10-CM | POA: Diagnosis not present

## 2016-04-04 DIAGNOSIS — I2510 Atherosclerotic heart disease of native coronary artery without angina pectoris: Secondary | ICD-10-CM | POA: Diagnosis not present

## 2016-04-04 DIAGNOSIS — K130 Diseases of lips: Secondary | ICD-10-CM | POA: Diagnosis not present

## 2016-04-04 DIAGNOSIS — D220 Melanocytic nevi of lip: Secondary | ICD-10-CM | POA: Diagnosis present

## 2016-04-04 DIAGNOSIS — Z7902 Long term (current) use of antithrombotics/antiplatelets: Secondary | ICD-10-CM | POA: Diagnosis not present

## 2016-04-04 DIAGNOSIS — I10 Essential (primary) hypertension: Secondary | ICD-10-CM | POA: Diagnosis not present

## 2016-04-04 LAB — BASIC METABOLIC PANEL
ANION GAP: 7 (ref 5–15)
BUN: 7 mg/dL (ref 6–20)
CALCIUM: 9.8 mg/dL (ref 8.9–10.3)
CHLORIDE: 102 mmol/L (ref 101–111)
CO2: 31 mmol/L (ref 22–32)
CREATININE: 1.2 mg/dL (ref 0.61–1.24)
GFR calc non Af Amer: >60 mL/min (ref >60)
Glucose, Bld: 98 mg/dL (ref 65–99)
Potassium: 4 mmol/L (ref 3.5–5.1)
SODIUM: 140 mmol/L (ref 135–145)

## 2016-04-05 ENCOUNTER — Encounter (HOSPITAL_BASED_OUTPATIENT_CLINIC_OR_DEPARTMENT_OTHER): Payer: Self-pay | Admitting: *Deleted

## 2016-04-05 ENCOUNTER — Ambulatory Visit (HOSPITAL_BASED_OUTPATIENT_CLINIC_OR_DEPARTMENT_OTHER)
Admission: RE | Admit: 2016-04-05 | Discharge: 2016-04-05 | Disposition: A | Discharge status: Home / Self Care | Payer: BLUE CROSS/BLUE SHIELD | Source: Ambulatory Visit | Attending: Plastic Surgery | Admitting: Plastic Surgery

## 2016-04-05 ENCOUNTER — Ambulatory Visit (HOSPITAL_BASED_OUTPATIENT_CLINIC_OR_DEPARTMENT_OTHER): Payer: BLUE CROSS/BLUE SHIELD | Admitting: Anesthesiology

## 2016-04-05 ENCOUNTER — Encounter (HOSPITAL_BASED_OUTPATIENT_CLINIC_OR_DEPARTMENT_OTHER)
Admission: RE | Disposition: A | Discharge status: Home / Self Care | Payer: Self-pay | Source: Ambulatory Visit | Attending: Plastic Surgery

## 2016-04-05 DIAGNOSIS — D2239 Melanocytic nevi of other parts of face: Secondary | ICD-10-CM | POA: Diagnosis not present

## 2016-04-05 DIAGNOSIS — K130 Diseases of lips: Secondary | ICD-10-CM | POA: Insufficient documentation

## 2016-04-05 DIAGNOSIS — I2510 Atherosclerotic heart disease of native coronary artery without angina pectoris: Secondary | ICD-10-CM | POA: Diagnosis not present

## 2016-04-05 DIAGNOSIS — I10 Essential (primary) hypertension: Secondary | ICD-10-CM | POA: Insufficient documentation

## 2016-04-05 DIAGNOSIS — Z7902 Long term (current) use of antithrombotics/antiplatelets: Secondary | ICD-10-CM | POA: Insufficient documentation

## 2016-04-05 DIAGNOSIS — D220 Melanocytic nevi of lip: Secondary | ICD-10-CM | POA: Diagnosis not present

## 2016-04-05 DIAGNOSIS — Z79899 Other long term (current) drug therapy: Secondary | ICD-10-CM | POA: Diagnosis not present

## 2016-04-05 DIAGNOSIS — D230 Other benign neoplasm of skin of lip: Secondary | ICD-10-CM | POA: Diagnosis not present

## 2016-04-05 HISTORY — PX: MASS EXCISION: SHX2000

## 2016-04-05 SURGERY — EXCISION MASS
Anesthesia: General | Site: Mouth

## 2016-04-05 MED ORDER — ONDANSETRON HCL 4 MG/2ML IJ SOLN
INTRAMUSCULAR | Status: AC
  Filled 2016-04-05: qty 2

## 2016-04-05 MED ORDER — BUPIVACAINE-EPINEPHRINE 0.5% -1:200000 IJ SOLN
INTRAMUSCULAR | Status: DC | PRN
  Administered 2016-04-05: 1 mL

## 2016-04-05 MED ORDER — FENTANYL CITRATE (PF) 100 MCG/2ML IJ SOLN
INTRAMUSCULAR | Status: AC
  Filled 2016-04-05: qty 2

## 2016-04-05 MED ORDER — HYDROCODONE-ACETAMINOPHEN 5-325 MG PO TABS: 1.0000 | ORAL_TABLET | ORAL | 0 refills | Status: DC | PRN

## 2016-04-05 MED ORDER — CEFAZOLIN SODIUM-DEXTROSE 2-4 GM/100ML-% IV SOLN
INTRAVENOUS | Status: AC
  Filled 2016-04-05: qty 100

## 2016-04-05 MED ORDER — FENTANYL CITRATE (PF) 100 MCG/2ML IJ SOLN
50.0000 ug | INTRAMUSCULAR | Status: DC | PRN
  Administered 2016-04-05: 100 ug via INTRAVENOUS

## 2016-04-05 MED ORDER — SCOPOLAMINE 1 MG/3DAYS TD PT72: 1.0000 | MEDICATED_PATCH | Freq: Once | TRANSDERMAL | Status: DC | PRN

## 2016-04-05 MED ORDER — LIDOCAINE 2% (20 MG/ML) 5 ML SYRINGE
INTRAMUSCULAR | Status: AC
  Filled 2016-04-05: qty 5

## 2016-04-05 MED ORDER — MEPERIDINE HCL 25 MG/ML IJ SOLN: 6.2500 mg | INTRAMUSCULAR | Status: DC | PRN

## 2016-04-05 MED ORDER — DEXAMETHASONE SODIUM PHOSPHATE 10 MG/ML IJ SOLN
INTRAMUSCULAR | Status: AC
  Filled 2016-04-05: qty 1

## 2016-04-05 MED ORDER — CEFAZOLIN SODIUM-DEXTROSE 2-4 GM/100ML-% IV SOLN
2.0000 g | INTRAVENOUS | Status: AC
  Administered 2016-04-05: 2 g via INTRAVENOUS

## 2016-04-05 MED ORDER — LACTATED RINGERS IV SOLN
INTRAVENOUS | Status: DC
  Administered 2016-04-05: 07:00:00 via INTRAVENOUS

## 2016-04-05 MED ORDER — MIDAZOLAM HCL 2 MG/2ML IJ SOLN
1.0000 mg | INTRAMUSCULAR | Status: DC | PRN
  Administered 2016-04-05: 2 mg via INTRAVENOUS

## 2016-04-05 MED ORDER — BUPIVACAINE-EPINEPHRINE (PF) 0.5% -1:200000 IJ SOLN
INTRAMUSCULAR | Status: AC
Start: 2016-04-05 — End: 2016-04-05
  Filled 2016-04-05: qty 5.4

## 2016-04-05 MED ORDER — FENTANYL CITRATE (PF) 100 MCG/2ML IJ SOLN: 25.0000 ug | INTRAMUSCULAR | Status: DC | PRN

## 2016-04-05 MED ORDER — LIDOCAINE-EPINEPHRINE 2 %-1:100000 IJ SOLN
INTRAMUSCULAR | Status: AC
Start: 2016-04-05 — End: 2016-04-05
  Filled 2016-04-05: qty 5.1

## 2016-04-05 MED ORDER — BUPIVACAINE HCL (PF) 0.25 % IJ SOLN
INTRAMUSCULAR | Status: AC
Start: 2016-04-05 — End: 2016-04-05
  Filled 2016-04-05: qty 60

## 2016-04-05 MED ORDER — LIDOCAINE 2% (20 MG/ML) 5 ML SYRINGE
INTRAMUSCULAR | Status: DC | PRN
  Administered 2016-04-05: 100 mg via INTRAVENOUS

## 2016-04-05 MED ORDER — BACITRACIN ZINC 500 UNIT/GM EX OINT
TOPICAL_OINTMENT | CUTANEOUS | Status: AC
Start: 2016-04-05 — End: 2016-04-05
  Filled 2016-04-05: qty 5.4

## 2016-04-05 MED ORDER — BUPIVACAINE-EPINEPHRINE (PF) 0.5% -1:200000 IJ SOLN
INTRAMUSCULAR | Status: AC
  Filled 2016-04-05: qty 30

## 2016-04-05 MED ORDER — LACTATED RINGERS IV SOLN: INTRAVENOUS | Status: DC

## 2016-04-05 MED ORDER — PROPOFOL 10 MG/ML IV BOLUS
INTRAVENOUS | Status: DC | PRN
  Administered 2016-04-05: 150 mg via INTRAVENOUS
  Administered 2016-04-05: 5 mg via INTRAVENOUS

## 2016-04-05 MED ORDER — METOCLOPRAMIDE HCL 5 MG/ML IJ SOLN: 10.0000 mg | Freq: Once | INTRAMUSCULAR | Status: DC | PRN

## 2016-04-05 MED ORDER — MIDAZOLAM HCL 2 MG/2ML IJ SOLN
INTRAMUSCULAR | Status: AC
  Filled 2016-04-05: qty 2

## 2016-04-05 SURGICAL SUPPLY — 57 items
BENZOIN TINCTURE PRP APPL 2/3 (GAUZE/BANDAGES/DRESSINGS) IMPLANT
BLADE CLIPPER SURG (BLADE) IMPLANT
BLADE SURG 11 STRL SS (BLADE) IMPLANT
BLADE SURG 15 STRL LF DISP TIS (BLADE) ×1 IMPLANT
BLADE SURG 15 STRL SS (BLADE) ×2
CANISTER SUCT 1200ML W/VALVE (MISCELLANEOUS) IMPLANT
CHLORAPREP W/TINT 26ML (MISCELLANEOUS) IMPLANT
CLOSURE WOUND 1/2 X4 (GAUZE/BANDAGES/DRESSINGS)
COVER BACK TABLE 60X90IN (DRAPES) ×3 IMPLANT
COVER MAYO STAND STRL (DRAPES) ×3 IMPLANT
DRAIN JP 10F RND SILICONE (MISCELLANEOUS) IMPLANT
DRAPE LAPAROTOMY 100X72 PEDS (DRAPES) IMPLANT
DRAPE U-SHAPE 76X120 STRL (DRAPES) IMPLANT
DRSG TELFA 3X8 NADH (GAUZE/BANDAGES/DRESSINGS) IMPLANT
ELECT COATED BLADE 2.86 ST (ELECTRODE) IMPLANT
ELECT NEEDLE BLADE 2-5/6 (NEEDLE) ×3 IMPLANT
ELECT REM PT RETURN 9FT ADLT (ELECTROSURGICAL) ×3
ELECT REM PT RETURN 9FT PED (ELECTROSURGICAL)
ELECTRODE REM PT RETRN 9FT PED (ELECTROSURGICAL) IMPLANT
ELECTRODE REM PT RTRN 9FT ADLT (ELECTROSURGICAL) ×1 IMPLANT
EVACUATOR SILICONE 100CC (DRAIN) IMPLANT
GAUZE XEROFORM 1X8 LF (GAUZE/BANDAGES/DRESSINGS) IMPLANT
GLOVE BIO SURGEON STRL SZ 6 (GLOVE) ×3 IMPLANT
GOWN STRL REUS W/ TWL LRG LVL3 (GOWN DISPOSABLE) ×2 IMPLANT
GOWN STRL REUS W/TWL LRG LVL3 (GOWN DISPOSABLE) ×4
LIQUID BAND (GAUZE/BANDAGES/DRESSINGS) IMPLANT
NEEDLE HYPO 30GX1 BEV (NEEDLE) IMPLANT
NEEDLE PRECISIONGLIDE 27X1.5 (NEEDLE) ×3 IMPLANT
NS IRRIG 1000ML POUR BTL (IV SOLUTION) IMPLANT
PACK BASIN DAY SURGERY FS (CUSTOM PROCEDURE TRAY) ×3 IMPLANT
PENCIL BUTTON HOLSTER BLD 10FT (ELECTRODE) ×3 IMPLANT
RUBBERBAND STERILE (MISCELLANEOUS) IMPLANT
SHEET MEDIUM DRAPE 40X70 STRL (DRAPES) ×3 IMPLANT
SLEEVE SCD COMPRESS KNEE MED (MISCELLANEOUS) IMPLANT
SPONGE GAUZE 2X2 8PLY STER LF (GAUZE/BANDAGES/DRESSINGS)
SPONGE GAUZE 2X2 8PLY STRL LF (GAUZE/BANDAGES/DRESSINGS) IMPLANT
SPONGE GAUZE 4X4 12PLY STER LF (GAUZE/BANDAGES/DRESSINGS) IMPLANT
SPONGE LAP 18X18 X RAY DECT (DISPOSABLE) IMPLANT
STRIP CLOSURE SKIN 1/2X4 (GAUZE/BANDAGES/DRESSINGS) IMPLANT
SUCTION FRAZIER HANDLE 10FR (MISCELLANEOUS)
SUCTION TUBE FRAZIER 10FR DISP (MISCELLANEOUS) IMPLANT
SUT ETHILON 4 0 PS 2 18 (SUTURE) IMPLANT
SUT MNCRL AB 4-0 PS2 18 (SUTURE) IMPLANT
SUT MON AB 5-0 P3 18 (SUTURE) IMPLANT
SUT PLAIN 5 0 P 3 18 (SUTURE) IMPLANT
SUT PROLENE 5 0 P 3 (SUTURE) IMPLANT
SUT PROLENE 6 0 P 1 18 (SUTURE) IMPLANT
SUT VICRYL 4-0 PS2 18IN ABS (SUTURE) IMPLANT
SWAB COLLECTION DEVICE MRSA (MISCELLANEOUS) IMPLANT
SWAB CULTURE ESWAB REG 1ML (MISCELLANEOUS) IMPLANT
SYR BULB 3OZ (MISCELLANEOUS) IMPLANT
SYR CONTROL 10ML LL (SYRINGE) ×3 IMPLANT
TOWEL OR 17X24 6PK STRL BLUE (TOWEL DISPOSABLE) ×3 IMPLANT
TRAY DSU PREP LF (CUSTOM PROCEDURE TRAY) ×3 IMPLANT
TUBE CONNECTING 20'X1/4 (TUBING)
TUBE CONNECTING 20X1/4 (TUBING) IMPLANT
YANKAUER SUCT BULB TIP 10FT TU (MISCELLANEOUS) IMPLANT

## 2016-04-05 NOTE — Transfer of Care (Signed)
Immediate Anesthesia Transfer of Care Note  Patient: TEAGUN TIVNAN  Procedure(s) Performed: Procedure(s): EXCISION OF LOWER LIP BENIGN LESION LESS THAN 1 CM AND LAYERED CLOSURE LIP 1.5 CM.  (N/A)  Patient Location: PACU  Anesthesia Type:General  Level of Consciousness: sedated  Airway & Oxygen Therapy: Patient Spontanous Breathing and Patient connected to face mask oxygen  Post-op Assessment: Report given to RN and Post -op Vital signs reviewed and stable  Post vital signs: Reviewed and stable  Last Vitals:  Vitals:   04/05/16 0639  BP: 128/80  Pulse: 60  Resp: 18  Temp: 36.4 C    Last Pain:  Vitals:   04/05/16 0639  TempSrc: Oral      Patients Stated Pain Goal: 0 ([id number])  Complications: No apparent anesthesia complications

## 2016-04-05 NOTE — H&P (Signed)
  Subjective:     Patient ID: Nathan Barrett is a 51 y.o. male.  HPI Here for excision lesion lower lip present for several years with continued growth and now frequent trauma to area (bites) and associated swelling. No spontaneous ulceration or bleeding.  PMH significant for angina, no MI, last catheterization cardiac 2014, followed [facility].On Plavix. Reports 4 total cardiac catheterizations. Dr. Martinique has cleared patient for surgery, no further recommendations.  Had cardiac cath in 2010 and 2014 showing extensive small vessel CAD with severe disease in several small terminal branches. Not a candidate for intervention per Dr. Martinique note, ECHO 20145 normal.  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  Review of Systems 12 point review negative.    Objective:   Physical Exam  Constitutional: He is oriented to person, place, and time.  Cardiovascular: Normal rate, regular rhythm and normal heart sounds.   Pulmonary/Chest: Effort normal and breath sounds normal.  Neurological: He is alert and oriented to person, place, and time.  Skin:  Fitzpatrick 5  HEENT: left lower lip over dry mucosa 6 mm papular nevus     Assessment:     Lower lip nevus    Plan:     Reviewed likely benign nature lesion, however in setting growth and frequent trauma plan excision. Offered in office vs with sedation- patient states not sure he could deal with blood in mouth or watching and prefers sedation. Reviewed scar longer than lesion itself, scar maturation over months, will be palpable lump for several weeks, bruising, post procedure limitation. Will likely be able to keep all of scar within vermilion which may have overall good fading of scar. Reviewed risks anesthesia, bleeding, wound healing problems, recurrence, need for additional procedures pending pathology, unacceptable cosmetic appearance.  Irene Limbo, MD [facility] Plastic & Reconstructive Surgery [phone], pin [phone]

## 2016-04-05 NOTE — Anesthesia Procedure Notes (Signed)
Procedure Name: LMA Insertion Date/Time: 04/05/2016 7:28 AM Performed by: Lieutenant Diego Pre-anesthesia Checklist: Patient identified, Emergency Drugs available, Suction available and Patient being monitored Patient Re-evaluated:Patient Re-evaluated prior to inductionOxygen Delivery Method: Circle system utilized Preoxygenation: Pre-oxygenation with 100% oxygen Intubation Type: IV induction Ventilation: Mask ventilation without difficulty LMA: LMA flexible inserted LMA Size: 4.0 Number of attempts: 1 Airway Equipment and Method: Bite block Placement Confirmation: positive ETCO2 and breath sounds checked- equal and bilateral Tube secured with: Tape Dental Injury: Teeth and Oropharynx as per pre-operative assessment

## 2016-04-05 NOTE — Op Note (Signed)
Operative Note   DATE OF OPERATION: 2.23.18  LOCATION: [facility]-outpatient  SURGICAL DIVISION: Plastic Surgery  PREOPERATIVE DIAGNOSES:  Lower lip nevus  POSTOPERATIVE DIAGNOSES:  same  PROCEDURE:  1. Excision benign lesion lower lip 0.8 cm 2. Layered closure lower lip 1.3 cm  SURGEON: Irene Limbo MD MBA  ASSISTANT: none  ANESTHESIA:  General.   EBL: minimal  COMPLICATIONS: None immediate.   INDICATIONS FOR PROCEDURE:  The patient, Nathan Barrett, is a 51 y.o. male born on 1965/09/29, is here for excision lower lip lesion present for several years.   FINDINGS: Raised lesion of vermillion and mucosa.  DESCRIPTION OF PROCEDURE:  The patient's operative site was marked with the patient in the preoperative area. The patient was taken to the operating room. IV antibiotics were given. The patient's operative site was prepped and draped in a sterile fashion. A time out was performed and all information was confirmed to be correct. Local anesthetic infiltrated to perform bilateral mental nerve blocks. Sharp excision lesion with borders diameter 0.8 cm completed. Hemostasis obtained. Layered closure completed with 5-0 monocryl in superficial muscle and deep mucosa. Closure completed with 4-0 chromic taking care to aline white roll and junction wet-dry mucosa, length repair 1.3 cm. Antibiotic ointment applied.  The patient was allowed to wake from anesthesia, extubated and taken to the recovery room in satisfactory condition.   SPECIMENS: lower lip lesion  DRAINS: none  Irene Limbo, MD [facility] Plastic & Reconstructive Surgery [phone], pin [phone]

## 2016-04-05 NOTE — Discharge Instructions (Signed)

## 2016-04-05 NOTE — Anesthesia Postprocedure Evaluation (Signed)
Anesthesia Post Note  Patient: Nathan Barrett  Procedure(s) Performed: Procedure(s) (LRB): EXCISION OF LOWER LIP BENIGN LESION LESS THAN 1 CM AND LAYERED CLOSURE LIP 1.5 CM.  (N/A)  Patient location during evaluation: PACU Anesthesia Type: General Level of consciousness: awake and alert Pain management: pain level controlled Vital Signs Assessment: post-procedure vital signs reviewed and stable Respiratory status: spontaneous breathing, nonlabored ventilation, respiratory function stable and patient connected to nasal cannula oxygen Cardiovascular status: blood pressure returned to baseline and stable Postop Assessment: no signs of nausea or vomiting Anesthetic complications: no       Last Vitals:  Vitals:   04/05/16 0845 04/05/16 0900  BP: 128/83 (!) 133/92  Pulse: (!) 51 (!) 51  Resp: 11 11  Temp:      Last Pain:  Vitals:   04/05/16 0900  TempSrc:   PainSc: 0-No pain                 Montez Hageman

## 2016-04-05 NOTE — Anesthesia Preprocedure Evaluation (Addendum)
Anesthesia Evaluation  Patient identified by MRN, date of birth, ID band Patient awake    Reviewed: Allergy & Precautions, NPO status , Patient's Chart, lab work & pertinent test results  Airway Mallampati: II  TM Distance: >3 FB Neck ROM: Full    Dental no notable dental hx.    Pulmonary neg pulmonary ROS,    Pulmonary exam normal breath sounds clear to auscultation       Cardiovascular hypertension, + CAD (medical management. on plavix)  Normal cardiovascular exam+ Valvular Problems/Murmurs (mild) AS  Rhythm:Regular Rate:Normal     Neuro/Psych negative neurological ROS  negative psych ROS   GI/Hepatic negative GI ROS, Neg liver ROS,   Endo/Other  negative endocrine ROS  Renal/GU negative Renal ROS  negative genitourinary   Musculoskeletal negative musculoskeletal ROS (+)   Abdominal   Peds negative pediatric ROS (+)  Hematology negative hematology ROS (+)   Anesthesia Other Findings   Reproductive/Obstetrics negative OB ROS                            Anesthesia Physical Anesthesia Plan  ASA: II  Anesthesia Plan: General   Post-op Pain Management:    Induction: Intravenous  Airway Management Planned: LMA  Additional Equipment:   Intra-op Plan:   Post-operative Plan:   Informed Consent: I have reviewed the patients History and Physical, chart, labs and discussed the procedure including the risks, benefits and alternatives for the proposed anesthesia with the patient or authorized representative who has indicated his/her understanding and acceptance.   Dental advisory given  Plan Discussed with: CRNA  Anesthesia Plan Comments:         Anesthesia Quick Evaluation

## 2016-04-08 ENCOUNTER — Encounter (HOSPITAL_BASED_OUTPATIENT_CLINIC_OR_DEPARTMENT_OTHER): Payer: Self-pay | Admitting: Plastic Surgery

## 2016-05-09 ENCOUNTER — Other Ambulatory Visit: Payer: Self-pay | Admitting: Cardiology

## 2016-06-10 DIAGNOSIS — R05 Cough: Secondary | ICD-10-CM | POA: Diagnosis not present

## 2016-06-10 DIAGNOSIS — J00 Acute nasopharyngitis [common cold]: Secondary | ICD-10-CM | POA: Diagnosis not present

## 2016-07-12 ENCOUNTER — Encounter: Payer: Self-pay | Admitting: Cardiology

## 2016-07-12 ENCOUNTER — Ambulatory Visit (INDEPENDENT_AMBULATORY_CARE_PROVIDER_SITE_OTHER): Payer: BLUE CROSS/BLUE SHIELD | Admitting: Cardiology

## 2016-07-12 ENCOUNTER — Other Ambulatory Visit: Payer: Self-pay

## 2016-07-12 VITALS — BP 130/78 | HR 62 | Ht 65.0 in | Wt 184.6 lb

## 2016-07-12 DIAGNOSIS — I10 Essential (primary) hypertension: Secondary | ICD-10-CM

## 2016-07-12 DIAGNOSIS — I350 Nonrheumatic aortic (valve) stenosis: Secondary | ICD-10-CM

## 2016-07-12 DIAGNOSIS — I25118 Atherosclerotic heart disease of native coronary artery with other forms of angina pectoris: Secondary | ICD-10-CM | POA: Diagnosis not present

## 2016-07-12 DIAGNOSIS — E7800 Pure hypercholesterolemia, unspecified: Secondary | ICD-10-CM | POA: Diagnosis not present

## 2016-07-12 MED ORDER — OLMESARTAN MEDOXOMIL-HCTZ 20-12.5 MG PO TABS: 1.0000 | ORAL_TABLET | Freq: Every day | ORAL | 3 refills | Status: DC

## 2016-07-12 MED ORDER — AMLODIPINE BESYLATE 5 MG PO TABS: 5.0000 mg | ORAL_TABLET | Freq: Every day | ORAL | 3 refills | Status: DC

## 2016-07-12 MED ORDER — CLOPIDOGREL BISULFATE 75 MG PO TABS: 75.0000 mg | ORAL_TABLET | Freq: Every day | ORAL | 3 refills | Status: DC

## 2016-07-12 MED ORDER — ATORVASTATIN CALCIUM 80 MG PO TABS
80.0000 mg | ORAL_TABLET | Freq: Every day | ORAL | 3 refills | Status: DC
Start: 2016-07-12 — End: 2017-07-07

## 2016-07-12 NOTE — Progress Notes (Signed)
Cardiology Office Note   Date:  07/12/2016   ID:  EMMITTE SURGEON, DOB [birth year], MRN [id number]  PCP:  Patient, No Pcp Per  Cardiologist:   Joseluis Alessio Martinique, MD   Chief Complaint  Patient presents with  . Coronary Artery Disease      History of Present Illness: Nathan Barrett is a 51 y.o. male who is seen for follow up  of CAD.  He has a known history of CAD. Had cardiac cath here in 2010 and 2014 showing extensive small vessel CAD with severe disease in several small terminal branches. Not a candidate for intervention. Was treated with Imdur in the past but this was not continued due to side effects ? HA. Not a candidate for beta blocker due to chronic bradycardia. Was evaluated at [facility]. Echo was normal. Stress Myoview was done and he was able to walk 8 minutes but developed 8/10 chest pain. HR only up to 121. Small inferolateral perfusion defect noted. Cardiac cath done. See my report below. He was started on Ranexa but noted no change in symptoms. When seen in follow up we started him on amlodipine and Ranexa was stopped due to lack of benefit.  His symptoms are epigastric pain and pressure. This is exertional.  On follow up today he reports he has done very well this past year. Only gets minor discomfort when he really pushes himself. He does walk regularly. Has an arthritic left knee.  He has made significant dietary changes and has lost 20 lbs.   Past Medical History:  Diagnosis Date  . Aortic stenosis    Mild  . Bradycardia   . Coronary artery disease   . H. pylori infection   . Hypercholesterolemia   . Hypertension     Past Surgical History:  Procedure Laterality Date  . CARDIAC CATHETERIZATION     Ejection Fraction 65%  . MASS EXCISION N/A 04/05/2016   Procedure: EXCISION OF LOWER LIP BENIGN LESION LESS THAN 1 CM AND LAYERED CLOSURE LIP 1.5 CM. ;  Surgeon: Irene Limbo, MD;  Location: [facility];  Service: Plastics;  Laterality: N/A;  .  WISDOM TOOTH EXTRACTION       Current Outpatient Prescriptions  Medication Sig Dispense Refill  . amLODipine (NORVASC) 5 MG tablet TAKE 1 TABLET DAILY 30 tablet 1  . atorvastatin (LIPITOR) 80 MG tablet TAKE 1 TABLET DAILY 30 tablet 1  . clopidogrel (PLAVIX) 75 MG tablet TAKE 1 TABLET DAILY 30 tablet 1  . HYDROcodone-acetaminophen (NORCO) 5-325 MG tablet Take 1 tablet by mouth every 4 (four) hours as needed for moderate pain. 10 tablet 0  . Multiple Vitamin (MULTIVITAMIN WITH MINERALS) TABS tablet Take 1 tablet by mouth daily.    . NON FORMULARY daily at 2 PM daily at 2 PM.    . olmesartan-hydrochlorothiazide (BENICAR HCT) 20-12.5 MG tablet TAKE 1 TABLET DAILY 30 tablet 1   No current facility-administered medications for this visit.     Allergies:   Patient has no known allergies.    Social History:  The patient  reports that he has never smoked. He has never used smokeless tobacco. He reports that he does not drink alcohol or use drugs.   Family History:  The patient's family history includes Heart disease in his father; Leukemia in his mother; Obstructive Sleep Apnea in his brother.    ROS:  Please see the history of present illness.   Otherwise, review of systems are positive  for none.   All other systems are reviewed and negative.    PHYSICAL EXAM: VS:  BP 130/78   Pulse 62   Ht 5\' 5"  (1.651 m)   Wt 184 lb 9.6 oz (83.7 kg)   SpO2 99%   BMI 30.72 kg/m  , BMI Body mass index is 30.72 kg/m. GEN: Well nourished, well developed, in no acute distress  HEENT: normal  Neck: no JVD, carotid bruits, or masses Cardiac: RRR; normal S1-2. Harsh gr 2/6 systolic murmur loudest at apex. Radiates to RUSB,no edema  Respiratory:  clear to auscultation bilaterally, normal work of breathing GI: soft, nontender, nondistended, + BS MS: no deformity or atrophy  Skin: warm and dry, no rash Neuro:  Strength and sensation are intact Psych: euthymic mood, full affect   EKG:  EKG is not  ordered today  Recent Labs: 04/04/2016: BUN 7; Creatinine, Ser 1.20; Potassium 4.0; Sodium 140    Lipid Panel    Component Value Date/Time   CHOL 137 03/30/2015 1015   TRIG 78 03/30/2015 1015   HDL 37 (L) 03/30/2015 1015   CHOLHDL 3.7 03/30/2015 1015   CHOLHDL 3 11/23/2010 1015   VLDL 16.6 11/23/2010 1015   LDLCALC 84 03/30/2015 1015      Wt Readings from Last 3 Encounters:  07/12/16 184 lb 9.6 oz (83.7 kg)  04/05/16 187 lb (84.8 kg)  06/06/15 204 lb 6.4 oz (92.7 kg)      Other studies Reviewed:  Cardiac cath films reviewed from [facility] 2015 Review of the above records demonstrates:   I have personally reviewed images.  There is diffuse 50% disease in the proximal and mid LAD. The distal LAD is occluded at the apex.  There is a large ramus intermediate branch with a critical ostial stenosis 99%. There is a 90% stenosis at the bifurcation of the ramus into 2 moderate sized branches. The LCx is small and has 80% ostial stenosis with occlusion of a small distal branch. The RCA has 50% mid vessel disease. The PDA is occluded and there is a 90% stenosis at the origin of the PLOM.   ASSESSMENT AND PLAN:  1.  CAD with stable class 2 angina. Patient with known small vessel CAD.  Much of his disease involves small distal branches that are not amenable to revascularization. The ramus intermediate has severe ostial and mid vessel disease but PCI would be difficult with ostial location and potential plaque shift. The mid vessel lesion is at a bifurcation. His angiogram is not significantly changed from 2010. Given stable symptoms I have recommended continued medical therapy.  He is not a candidate for beta blocker due to bradycardia. Imdur in the past was not tolerated. Ranexa has not resulted in any improvement in symptoms. He is currently with class 1-2 symptoms. Continue  amlodipine 5 mg daily.  Continue Plavix and high dose statin.   2. HTN. Well controlled.   3. Hypercholesterolemia.   Need to update labs on high dose lipitor. If still not at goal would recommend addition of PCSK 9 inhibitor.   4. Murmur. Echo here in 2014 showed mild AS. Echo at [facility] showed no valvular disease. I suspect he has AS or at least a LVOT murmur. Will update Echo now.   Current medicines are reviewed at length with the patient today.  The patient does not have concerns regarding medicines.  The following changes have been made:  See above  Labs/ tests ordered today include:   Orders Placed This  Encounter  Procedures  . Basic metabolic panel  . Lipid panel  . Hepatic function panel  . ECHOCARDIOGRAM COMPLETE      Signed, Lemoyne Scarpati Martinique, MD  07/12/2016 4:00 PM    [place] [address] [place], [place], [address] Phone [phone], Fax [phone]

## 2016-07-12 NOTE — Patient Instructions (Signed)
We will refill you prescriptions  We will check blood work today  We will schedule you for an Echocardiogram to follow up on your murmur

## 2016-07-13 LAB — LIPID PANEL
CHOLESTEROL TOTAL: 120 mg/dL (ref 100–199)
Chol/HDL Ratio: 3 ratio (ref 0.0–5.0)
HDL: 40 mg/dL (ref >39)
LDL Calculated: 66 mg/dL (ref 0–99)
Triglycerides: 70 mg/dL (ref 0–149)
VLDL CHOLESTEROL CAL: 14 mg/dL (ref 5–40)

## 2016-07-13 LAB — BASIC METABOLIC PANEL
BUN / CREAT RATIO: 9 (ref 9–20)
BUN: 11 mg/dL (ref 6–24)
CHLORIDE: 102 mmol/L (ref 96–106)
CO2: 26 mmol/L (ref 18–29)
Calcium: 9.9 mg/dL (ref 8.7–10.2)
Creatinine, Ser: 1.18 mg/dL (ref 0.76–1.27)
GFR calc Af Amer: 82 mL/min/{1.73_m2} (ref >59)
GFR calc non Af Amer: 71 mL/min/{1.73_m2} (ref >59)
GLUCOSE: 76 mg/dL (ref 65–99)
POTASSIUM: 4 mmol/L (ref 3.5–5.2)
SODIUM: 144 mmol/L (ref 134–144)

## 2016-07-13 LAB — HEPATIC FUNCTION PANEL
ALBUMIN: 4.5 g/dL (ref 3.5–5.5)
ALK PHOS: 128 IU/L — AB (ref 39–117)
ALT: 31 IU/L (ref 0–44)
AST: 33 IU/L (ref 0–40)
BILIRUBIN TOTAL: 0.3 mg/dL (ref 0.0–1.2)
Bilirubin, Direct: 0.1 mg/dL (ref 0.00–0.40)
TOTAL PROTEIN: 7.1 g/dL (ref 6.0–8.5)

## 2016-07-25 ENCOUNTER — Ambulatory Visit (HOSPITAL_COMMUNITY): Payer: BLUE CROSS/BLUE SHIELD | Attending: Internal Medicine

## 2016-07-25 ENCOUNTER — Other Ambulatory Visit: Payer: Self-pay

## 2016-07-25 DIAGNOSIS — I358 Other nonrheumatic aortic valve disorders: Secondary | ICD-10-CM | POA: Insufficient documentation

## 2016-07-25 DIAGNOSIS — I10 Essential (primary) hypertension: Secondary | ICD-10-CM

## 2016-07-25 DIAGNOSIS — E7800 Pure hypercholesterolemia, unspecified: Secondary | ICD-10-CM

## 2016-07-25 DIAGNOSIS — I25118 Atherosclerotic heart disease of native coronary artery with other forms of angina pectoris: Secondary | ICD-10-CM | POA: Insufficient documentation

## 2016-07-25 DIAGNOSIS — I350 Nonrheumatic aortic (valve) stenosis: Secondary | ICD-10-CM

## 2017-04-09 DIAGNOSIS — N529 Male erectile dysfunction, unspecified: Secondary | ICD-10-CM | POA: Diagnosis not present

## 2017-07-07 ENCOUNTER — Other Ambulatory Visit: Payer: Self-pay | Admitting: Cardiology

## 2017-10-06 ENCOUNTER — Encounter (HOSPITAL_COMMUNITY): Payer: Self-pay | Admitting: Physician Assistant

## 2017-10-06 ENCOUNTER — Emergency Department (HOSPITAL_COMMUNITY): Payer: BLUE CROSS/BLUE SHIELD

## 2017-10-06 ENCOUNTER — Other Ambulatory Visit: Payer: Self-pay

## 2017-10-06 ENCOUNTER — Ambulatory Visit (HOSPITAL_BASED_OUTPATIENT_CLINIC_OR_DEPARTMENT_OTHER): Payer: BLUE CROSS/BLUE SHIELD

## 2017-10-06 ENCOUNTER — Observation Stay (HOSPITAL_COMMUNITY)
Admission: EM | Admit: 2017-10-06 | Discharge: 2017-10-07 | Disposition: A | Discharge status: Home / Self Care | Payer: BLUE CROSS/BLUE SHIELD | Attending: Internal Medicine | Admitting: Internal Medicine

## 2017-10-06 ENCOUNTER — Other Ambulatory Visit: Payer: Self-pay | Admitting: Cardiology

## 2017-10-06 DIAGNOSIS — R079 Chest pain, unspecified: Secondary | ICD-10-CM | POA: Diagnosis present

## 2017-10-06 DIAGNOSIS — I129 Hypertensive chronic kidney disease with stage 1 through stage 4 chronic kidney disease, or unspecified chronic kidney disease: Principal | ICD-10-CM | POA: Insufficient documentation

## 2017-10-06 DIAGNOSIS — R55 Syncope and collapse: Secondary | ICD-10-CM | POA: Diagnosis not present

## 2017-10-06 DIAGNOSIS — I209 Angina pectoris, unspecified: Secondary | ICD-10-CM | POA: Diagnosis not present

## 2017-10-06 DIAGNOSIS — N182 Chronic kidney disease, stage 2 (mild): Secondary | ICD-10-CM | POA: Insufficient documentation

## 2017-10-06 DIAGNOSIS — I5030 Unspecified diastolic (congestive) heart failure: Secondary | ICD-10-CM

## 2017-10-06 DIAGNOSIS — I25118 Atherosclerotic heart disease of native coronary artery with other forms of angina pectoris: Secondary | ICD-10-CM | POA: Diagnosis not present

## 2017-10-06 DIAGNOSIS — I2510 Atherosclerotic heart disease of native coronary artery without angina pectoris: Secondary | ICD-10-CM | POA: Diagnosis present

## 2017-10-06 DIAGNOSIS — Z7902 Long term (current) use of antithrombotics/antiplatelets: Secondary | ICD-10-CM | POA: Diagnosis not present

## 2017-10-06 DIAGNOSIS — E7800 Pure hypercholesterolemia, unspecified: Secondary | ICD-10-CM | POA: Diagnosis present

## 2017-10-06 DIAGNOSIS — I350 Nonrheumatic aortic (valve) stenosis: Secondary | ICD-10-CM

## 2017-10-06 DIAGNOSIS — H81399 Other peripheral vertigo, unspecified ear: Secondary | ICD-10-CM | POA: Insufficient documentation

## 2017-10-06 DIAGNOSIS — Z79899 Other long term (current) drug therapy: Secondary | ICD-10-CM | POA: Diagnosis not present

## 2017-10-06 DIAGNOSIS — I10 Essential (primary) hypertension: Secondary | ICD-10-CM | POA: Diagnosis not present

## 2017-10-06 DIAGNOSIS — R42 Dizziness and giddiness: Secondary | ICD-10-CM | POA: Diagnosis not present

## 2017-10-06 HISTORY — DX: Chronic kidney disease, stage 2 (mild): N18.2

## 2017-10-06 LAB — BASIC METABOLIC PANEL
ANION GAP: 8 (ref 5–15)
BUN: 15 mg/dL (ref 6–20)
CALCIUM: 9.2 mg/dL (ref 8.9–10.3)
CHLORIDE: 104 mmol/L (ref 98–111)
CO2: 28 mmol/L (ref 22–32)
CREATININE: 1.31 mg/dL — AB (ref 0.61–1.24)
GFR calc Af Amer: >60 mL/min (ref >60)
GFR calc non Af Amer: >60 mL/min (ref >60)
Glucose, Bld: 104 mg/dL — ABNORMAL HIGH (ref 70–99)
Potassium: 3.7 mmol/L (ref 3.5–5.1)
SODIUM: 140 mmol/L (ref 135–145)

## 2017-10-06 LAB — CBC
HCT: 46.2 % (ref 39.0–52.0)
HEMOGLOBIN: 14.9 g/dL (ref 13.0–17.0)
MCH: 28.2 pg (ref 26.0–34.0)
MCHC: 32.3 g/dL (ref 30.0–36.0)
MCV: 87.3 fL (ref 78.0–100.0)
PLATELETS: 236 10*3/uL (ref 150–400)
RBC: 5.29 MIL/uL (ref 4.22–5.81)
RDW: 13.7 % (ref 11.5–15.5)
WBC: 5.2 10*3/uL (ref 4.0–10.5)

## 2017-10-06 LAB — HIV ANTIBODY (ROUTINE TESTING W REFLEX): HIV SCREEN 4TH GENERATION: NONREACTIVE

## 2017-10-06 LAB — TROPONIN I
Troponin I: <0.03 ng/mL (ref <0.03)
Troponin I: <0.03 ng/mL (ref <0.03)

## 2017-10-06 LAB — ECHOCARDIOGRAM COMPLETE
Height: 65 in
Weight: 3091.2 oz

## 2017-10-06 LAB — TSH: TSH: 1.756 u[IU]/mL (ref 0.350–4.500)

## 2017-10-06 LAB — I-STAT TROPONIN, ED: Troponin i, poc: 0.02 ng/mL (ref 0.00–0.08)

## 2017-10-06 MED ORDER — MORPHINE SULFATE (PF) 2 MG/ML IV SOLN: 2.0000 mg | INTRAVENOUS | Status: DC | PRN

## 2017-10-06 MED ORDER — OLMESARTAN MEDOXOMIL-HCTZ 20-12.5 MG PO TABS: 1.0000 | ORAL_TABLET | Freq: Every day | ORAL | Status: DC

## 2017-10-06 MED ORDER — HYDROCHLOROTHIAZIDE 12.5 MG PO CAPS
12.5000 mg | ORAL_CAPSULE | Freq: Every day | ORAL | Status: DC
  Administered 2017-10-06: 12.5 mg via ORAL
  Filled 2017-10-06: qty 1

## 2017-10-06 MED ORDER — ENOXAPARIN SODIUM 40 MG/0.4ML ~~LOC~~ SOLN: 40.0000 mg | SUBCUTANEOUS | Status: DC

## 2017-10-06 MED ORDER — AMLODIPINE BESYLATE 5 MG PO TABS
5.0000 mg | ORAL_TABLET | Freq: Every day | ORAL | Status: AC
  Administered 2017-10-06: 5 mg via ORAL
  Filled 2017-10-06: qty 1

## 2017-10-06 MED ORDER — MORPHINE SULFATE (PF) 4 MG/ML IV SOLN
4.0000 mg | Freq: Once | INTRAVENOUS | Status: AC
  Administered 2017-10-06: 4 mg via INTRAVENOUS
  Filled 2017-10-06: qty 1

## 2017-10-06 MED ORDER — ACETAMINOPHEN 325 MG PO TABS
650.0000 mg | ORAL_TABLET | ORAL | Status: DC | PRN
  Administered 2017-10-06: 650 mg via ORAL
  Filled 2017-10-06: qty 2

## 2017-10-06 MED ORDER — ADULT MULTIVITAMIN W/MINERALS CH
1.0000 | ORAL_TABLET | Freq: Every day | ORAL | Status: DC
  Administered 2017-10-06 – 2017-10-07 (×2): 1 via ORAL
  Filled 2017-10-06 (×2): qty 1

## 2017-10-06 MED ORDER — IRBESARTAN 150 MG PO TABS
75.0000 mg | ORAL_TABLET | Freq: Every day | ORAL | Status: DC
  Administered 2017-10-07: 75 mg via ORAL
  Filled 2017-10-06: qty 1

## 2017-10-06 MED ORDER — CLOPIDOGREL BISULFATE 75 MG PO TABS
75.0000 mg | ORAL_TABLET | Freq: Every day | ORAL | Status: DC
  Administered 2017-10-06 – 2017-10-07 (×2): 75 mg via ORAL
  Filled 2017-10-06 (×2): qty 1

## 2017-10-06 MED ORDER — AMLODIPINE BESYLATE 5 MG PO TABS
5.0000 mg | ORAL_TABLET | Freq: Once | ORAL | Status: AC
  Administered 2017-10-06: 5 mg via ORAL
  Filled 2017-10-06: qty 1

## 2017-10-06 MED ORDER — MECLIZINE HCL 25 MG PO TABS
50.0000 mg | ORAL_TABLET | Freq: Once | ORAL | Status: AC
  Administered 2017-10-06: 50 mg via ORAL
  Filled 2017-10-06: qty 2

## 2017-10-06 MED ORDER — CLOPIDOGREL BISULFATE 75 MG PO TABS: 75.0000 mg | ORAL_TABLET | Freq: Every day | ORAL | Status: DC

## 2017-10-06 MED ORDER — IRBESARTAN 150 MG PO TABS
150.0000 mg | ORAL_TABLET | Freq: Every day | ORAL | Status: DC
  Administered 2017-10-06: 150 mg via ORAL
  Filled 2017-10-06: qty 1

## 2017-10-06 MED ORDER — AMLODIPINE BESYLATE 10 MG PO TABS
10.0000 mg | ORAL_TABLET | Freq: Every day | ORAL | Status: DC
  Administered 2017-10-07: 10 mg via ORAL
  Filled 2017-10-06: qty 1

## 2017-10-06 MED ORDER — HYDROCHLOROTHIAZIDE 12.5 MG PO CAPS
12.5000 mg | ORAL_CAPSULE | Freq: Every day | ORAL | Status: DC
  Administered 2017-10-07: 12.5 mg via ORAL
  Filled 2017-10-06: qty 1

## 2017-10-06 MED ORDER — ATORVASTATIN CALCIUM 80 MG PO TABS
80.0000 mg | ORAL_TABLET | Freq: Every day | ORAL | Status: DC
  Administered 2017-10-06 – 2017-10-07 (×2): 80 mg via ORAL
  Filled 2017-10-06 (×2): qty 1

## 2017-10-06 MED ORDER — NITROGLYCERIN 2 % TD OINT
1.0000 [in_us] | TOPICAL_OINTMENT | Freq: Once | TRANSDERMAL | Status: AC
  Administered 2017-10-06: 1 [in_us] via TOPICAL
  Filled 2017-10-06: qty 1

## 2017-10-06 MED ORDER — ASPIRIN 81 MG PO CHEW
324.0000 mg | CHEWABLE_TABLET | Freq: Once | ORAL | Status: AC
  Administered 2017-10-06: 324 mg via ORAL
  Filled 2017-10-06: qty 4

## 2017-10-06 MED ORDER — ONDANSETRON HCL 4 MG/2ML IJ SOLN: 4.0000 mg | Freq: Four times a day (QID) | INTRAMUSCULAR | Status: DC | PRN

## 2017-10-06 MED ORDER — ATORVASTATIN CALCIUM 80 MG PO TABS: 80.0000 mg | ORAL_TABLET | Freq: Every day | ORAL | Status: DC

## 2017-10-06 MED ORDER — ENOXAPARIN SODIUM 40 MG/0.4ML ~~LOC~~ SOLN
40.0000 mg | SUBCUTANEOUS | Status: DC
  Administered 2017-10-06 – 2017-10-07 (×2): 40 mg via SUBCUTANEOUS
  Filled 2017-10-06 (×2): qty 0.4

## 2017-10-06 NOTE — Progress Notes (Signed)
  Echocardiogram 2D Echocardiogram has been performed.  Nathan Barrett 10/06/2017, 4:31 PM

## 2017-10-06 NOTE — Progress Notes (Signed)
  [place] TEAM 1 - Stepdown/ICU TEAM  Nathan Barrett  NUU:[id number] DOB: 1965-06-08 DOA: 10/06/2017 PCP: Patient, No Pcp Per    Brief Narrative:  52 y.o. male with a hx of mild AoS, HTN, and CAD (severe diffuse triple vessel disease on LHC in 2015 w/ no good revascularization targets > medical management only) who presented to the ED with substernal chest pain, dizziness, and nausea.   Significant Events: 8/26 admit  Subjective: Pt is seen for a f/u visit.  He reports his chest pain has resolved.    Assessment & Plan:  Chest pain  HTN  HLD  AoS  DVT prophylaxis: lovenox  Code Status: FULL CODE Family Communication:  Disposition Plan:   Consultants:  Cardiology  Antimicrobials:  none  Objective: Blood pressure (!) 103/57, pulse (!) 52, temperature 97.8 F (36.6 C), temperature source Oral, resp. rate 20, height 5\' 5"  (1.651 m), weight 87.6 kg, SpO2 99 %. No intake or output data in the 24 hours ending 10/06/17 1753 Filed Weights   10/06/17 0408 10/06/17 0621  Weight: 81.6 kg 87.6 kg    Examination: Pt seen for a f/u visit  CBC: Recent Labs  Lab 10/06/17 0410  WBC 5.2  HGB 14.9  HCT 46.2  MCV 87.3  PLT 347   Basic Metabolic Panel: Recent Labs  Lab 10/06/17 0410  NA 140  K 3.7  CL 104  CO2 28  GLUCOSE 104*  BUN 15  CREATININE 1.31*  CALCIUM 9.2   GFR: Estimated Creatinine Clearance: 66.3 mL/min (A) (by C-G formula based on SCr of 1.31 mg/dL (H)).  Liver Function Tests: No results for input(s): AST, ALT, ALKPHOS, BILITOT, PROT, ALBUMIN in the last 168 hours. No results for input(s): LIPASE, AMYLASE in the last 168 hours. No results for input(s): AMMONIA in the last 168 hours.  Coagulation Profile: No results for input(s): INR, PROTIME in the last 168 hours.  Cardiac Enzymes: Recent Labs  Lab 10/06/17 0533 10/06/17 0955 10/06/17 1237 10/06/17 1521  TROPONINI <0.03 <0.03 <0.03 <0.03    Scheduled Meds: . [START ON 10/07/2017]  amLODipine  10 mg Oral Daily  . atorvastatin  80 mg Oral Daily  . clopidogrel  75 mg Oral Daily  . enoxaparin (LOVENOX) injection  40 mg Subcutaneous Q24H  . [START ON 10/07/2017] irbesartan  75 mg Oral Daily   And  . [START ON 10/07/2017] hydrochlorothiazide  12.5 mg Oral Daily  . multivitamin with minerals  1 tablet Oral Daily    LOS: 0 days   Cherene Altes, MD [facility] Office  [phone] Pager - Text Page per Amion  If 7PM-7AM, please contact night-coverage per Amion 10/06/2017, 5:53 PM

## 2017-10-06 NOTE — Consult Note (Addendum)
Cardiology Consultation:   Patient ID: Nathan Barrett; [id number]; January 21, 1966   Admit date: 10/06/2017 Date of Consult: 10/06/2017  Primary Care Provider: Patient, No Pcp Per Primary Cardiologist: Peter Martinique, MD  Chief Complaint: chest pain/dizziness  Patient Profile:   Nathan Barrett is a 52 y.o. 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 Alcario Drought.  History of Present Illness:   Nathan Barrett has a known history of CAD. He had cardiac cath here in 2010 and 2014 showing extensive small vessel CAD with severe disease in several small terminal branches, not a candidate for intervention. Per Dr. Doug Sou note, he was treated with Imdur in the past but this was not continued due to side effects (? HA - not clear.) He is not a candidate for beta blocker due to chronic bradycardia. He was evaluated at [facility] in 2015. Echo was normal. Stress Myoview was done and he was able to walk 8 minutes but developed 8/10 chest pain. HR only got up to 121. Small inferolateral perfusion defect noted. Cardiac cath, per Dr. Doug Sou review, showed:  There is diffuse 50% disease in the proximal and mid LAD. The distal LAD is occluded at the apex. There is a large ramus intermediate branch with a critical ostial stenosis 99%. There is a 90% stenosis at the bifurcation of the ramus into 2 moderate sized branches. The LCx is small and has 80% ostial stenosis with occlusion of a small distal branch. The RCA has 50% mid vessel disease. The PDA is occluded and there is a 90% stenosis at the origin of the PLOM.  He was started on Ranexa but noted no change in symptoms. When seen in follow up he was started him on amlodipine and Ranexa was stopped due to lack of benefit. At last OV 07/2016, he was having class I-II symptoms and medical therapy was continued. 2D echo 07/2016  showed EF 60-65%, grade 2 DD, mildly calcified AV with minimally restricted motion, peak and mean gradients 19 and 27mmg respectively.  In general he states he's been very active mowing the lawn and lifting heavy items without any anginal pain or limitation lately. Yesterday AM he awoke with posterior neck pain and felt the room spinning. He repositioned himself and got relief, but dizziness recurred with some nausea so he decided to get up for the day. The dizziness reminded him of when his BP is up but it was normal. That's when he noticed some substernal chest pressure/tightness. This persisted essentially all day long, but was not particularly severe. He went on to church and felt better when congregation was praying and while he was singing. Nothing made symptoms better or worse. It did not change with activity. He hoped to avoid coming to the hospital so went about his day as usual. He went to bed feeling like the dizziness was still there but the chest pain had eased off. When he awoke he hoped the pain would have resolved but again felt fairly constant dizziness and chest pressure. Superimposed on the baseline dizziness, the room would intermittently spin, specifically if bending over and leaning up quickly. His wife convinced him to come to the ER. HR was 63 upon arrival while still feeling dizzy. The most intense pain was improved with combination of NTG patch and morphine. He now just feels a vague background  pressure. He's not had any further dizziness this afternoon. Labs show Cr 1.31 (similar to prior), troponin neg x3 since 4am, CBC wnl, CXR nonacute. VSS except sinus bradycardia with one drop into the low 40s, but generally upper 40s-60s. No recent travel, surgery, bedrest, edema, syncope, palpitations.  Past Medical History:  Diagnosis Date  . Aortic stenosis    Mild  . Bradycardia   . CKD (chronic kidney disease), stage II   . Coronary artery disease   . H. pylori infection   .  Hypercholesterolemia   . Hypertension     Past Surgical History:  Procedure Laterality Date  . CARDIAC CATHETERIZATION     Ejection Fraction 65%  . MASS EXCISION N/A 04/05/2016   Procedure: EXCISION OF LOWER LIP BENIGN LESION LESS THAN 1 CM AND LAYERED CLOSURE LIP 1.5 CM. ;  Surgeon: Irene Limbo, MD;  Location: [place];  Service: Plastics;  Laterality: N/A;  . WISDOM TOOTH EXTRACTION       Inpatient Medications: Scheduled Meds: . amLODipine  5 mg Oral Daily  . atorvastatin  80 mg Oral Daily  . clopidogrel  75 mg Oral Daily  . enoxaparin (LOVENOX) injection  40 mg Subcutaneous Q24H  . irbesartan  150 mg Oral Daily   And  . hydrochlorothiazide  12.5 mg Oral Daily  . multivitamin with minerals  1 tablet Oral Daily   Continuous Infusions:  PRN Meds: acetaminophen, morphine injection, ondansetron (ZOFRAN) IV  Home Meds: Prior to Admission medications   Medication Sig Start Date End Date Taking? Authorizing Provider  amLODipine (NORVASC) 5 MG tablet TAKE 1 TABLET DAILY 07/08/17  Yes Martinique, Peter M, MD  atorvastatin (LIPITOR) 80 MG tablet TAKE 1 TABLET DAILY 07/08/17  Yes Martinique, Peter M, MD  clopidogrel (PLAVIX) 75 MG tablet TAKE 1 TABLET DAILY 07/08/17  Yes Martinique, Peter M, MD  Multiple Vitamin (MULTIVITAMIN WITH MINERALS) TABS tablet Take 1 tablet by mouth daily.   Yes [provider]  olmesartan-hydrochlorothiazide (BENICAR HCT) 20-12.5 MG tablet TAKE 1 TABLET DAILY 07/08/17  Yes Martinique, Peter M, MD    Allergies:   No Known Allergies  Social History:   Social History   Socioeconomic History  . Marital status: Married    Spouse name: Not on file  . Number of children: 3  . Years of education: Not on file  . Highest education level: Not on file  Occupational History  . 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 Financial resource strain: Not on file  . Food insecurity:    Worry: Not on file    Inability: Not on  file  . Transportation needs:    Medical: Not on file    Non-medical: Not on file  Tobacco Use  . Smoking status: Never Smoker  . Smokeless tobacco: Never Used  Substance and Sexual Activity  . Alcohol use: No    Alcohol/week: 0.0 standard drinks  . Drug use: No  . Sexual activity: Not on file  Lifestyle  . Physical activity:    Days per week: Not on file    Minutes per session: Not on file  . Stress: Not on file  Relationships  . Social connections:    Talks on phone: Not on file    Gets together: Not on file    Attends religious service: Not on file    Active member of club or organization: Not on file    Attends meetings of clubs  or organizations: Not on file    Relationship status: Not on file  . Intimate partner violence:    Fear of current or ex partner: Not on file    Emotionally abused: Not on file    Physically abused: Not on file    Forced sexual activity: Not on file  Other Topics Concern  . Not on file  Social History Narrative  . Not on file    Family History:   The patient's family history includes Heart disease in his father; Leukemia in his mother; Obstructive Sleep Apnea in his brother.  ROS:  Please see the history of present illness.  All other ROS reviewed and negative.     Physical Exam/Data:   Vitals:   10/06/17 0621 10/06/17 0925 10/06/17 1107 10/06/17 1154  BP: (!) 140/96 109/74  104/81  Pulse: (!) 59  (!) 52 (!) 52  Resp:    20  Temp: 98 F (36.7 C)   97.8 F (36.6 C)  TempSrc: Oral   Oral  SpO2: 100%   99%  Weight: 87.6 kg     Height: 5\' 5"  (1.651 m)      No intake or output data in the 24 hours ending 10/06/17 1303 Filed Weights   10/06/17 0408 10/06/17 0621  Weight: 81.6 kg 87.6 kg   Body mass index is 32.15 kg/m.  General: Well developed, well nourished AAM, in no acute distress. Head: Normocephalic, atraumatic, sclera non-icteric, no xanthomas, nares are without discharge.  Neck: Negative for carotid bruits. JVD not  elevated. Lungs: Clear bilaterally to auscultation without wheezes, rales, or rhonchi. Breathing is unlabored. Heart: RRR with S1 S2. Soft SEM at RUSB. No rubs or gallops appreciated. Abdomen: Soft, non-tender, non-distended with normoactive bowel sounds. No hepatomegaly. No rebound/guarding. No obvious abdominal masses. Msk:  Strength and tone appear normal for age. Extremities: No clubbing or cyanosis. No edema.  Distal pedal pulses are 2+ and equal bilaterally. Neuro: Alert and oriented X 3. No facial asymmetry. No focal deficit. Moves all extremities spontaneously. Psych:  Responds to questions appropriately with a normal affect.  EKG:  The EKG was personally reviewed and demonstrates nSR 62bpm, baseline artifact without specific abnormality.  Laboratory Data:  Chemistry Recent Labs  Lab 10/06/17 0410  NA 140  K 3.7  CL 104  CO2 28  GLUCOSE 104*  BUN 15  CREATININE 1.31*  CALCIUM 9.2  GFRNONAA >60  GFRAA >60  ANIONGAP 8    No results for input(s): PROT, ALBUMIN, AST, ALT, ALKPHOS, BILITOT in the last 168 hours. Hematology Recent Labs  Lab 10/06/17 0410  WBC 5.2  RBC 5.29  HGB 14.9  HCT 46.2  MCV 87.3  MCH 28.2  MCHC 32.3  RDW 13.7  PLT 236   Cardiac Enzymes Recent Labs  Lab 10/06/17 0533 10/06/17 0955  TROPONINI <0.03 <0.03    Recent Labs  Lab 10/06/17 0428  TROPIPOC 0.02    BNPNo results for input(s): BNP, PROBNP in the last 168 hours.  DDimer No results for input(s): DDIMER in the last 168 hours.  Radiology/Studies:  Dg Chest 2 View  Result Date: 10/06/2017 CLINICAL DATA:  52 year old male with chest pain. EXAM: CHEST - 2 VIEW COMPARISON:  Chest radiograph dated 11/08/2008 FINDINGS: The heart size and mediastinal contours are within normal limits. Both lungs are clear. The visualized skeletal structures are unremarkable. IMPRESSION: No active cardiopulmonary disease. Electronically Signed   By: Anner Crete M.D.   On: 10/06/2017 05:15  Assessment and Plan:   1. Chest pain with significant cardiac hx - mixed features, no clearcut evidence of ischemia. Per d/w Dr. Ellyn Hack, plan medication adjustment for now - will DC NTG paste, and plan to give another dose of amlodipine this afternoon if SBP is >110. If BP tolerates this and feeling well in AM, would consolidate new home dose of 10mg  daily. To accomodate this, we will decrease his ARB dose. Will f/u troponin this afternoon along with echocardiogram.  2. Dizziness - etiology unclear, some aspects sound vertiginous. It's not clear if this is related to bradycardia as he has chronic sinus bradycardia and HR was normal upon arrival to the ER. He does have episodic HRs in the 40s. Will need to follow closely on telemetry for any correlation. Also got Antivert with no subsequent dizziness. Pt was instructed to press call button if any recurrent dizziness so that nurse can correlate with telemetry. Some of his BPs have been soft this afternoon but this is on NTG paste. See above re: med adjustment. May need to consider Holter as OP.   3. Baseline bradycardia -as above.  not on AVN blocking agents because of this. This seems independent of the chest pressure, as he has symptoms even when HR is normal. Continue to follow on telemetry and check TSH.  4. Mild aortic stenosis - mild by echo 07/2016. Murmur is not particularly pronounced. Will await echocardiogram.  For questions or updates, please contact [place] Please consult [email or web address] for contact info under Cardiology/STEMI.    Signed, Nathan Pitter, PA-C  10/06/2017 1:03 PM   I have seen, examined and evaluated the patient this p.m. along with Melina Copa, PA.  After reviewing all the available data and chart, we discussed the patients laboratory, study & physical findings as well as symptoms in detail. I agree with her findings, examination as well as impression recommendations as per our discussion.    Somewhat confusing  story.  Mr. Kronk has clear evidence of multivessel CAD as well as mild aortic stenosis and hypertension with baseline bradycardia.  For his coronary disease he has not tolerated beta-blocker due to bradycardia, nitrates due to headache, but has been well-controlled on amlodipine.  He presents now with very confusing conglomeration of symptoms including dizziness headache and almost vertigo type sounding symptoms along with prolonged chest discomfort at rest. My inclination this is probably noncardiac chest pain -however, we can check a 2D echocardiogram to exclude possible pericarditis.  Since he did have some improvement in symptoms with the nitroglycerin paste there may be some small vessel anginal component.  We may be L to try restarting long-acting nitrate since he seems to be tolerating the Nitropaste, but for now I would recommend titrating up amlodipine and down ARB. We will need to monitor for further bradycardia with increasing the calcium channel blocker.  I think if he is doing well tomorrow and able to ambulate without significant symptoms could probably be ready for discharge of his echocardiogram looks okay.  I will defer to Dr. Martinique to determine whether or not he feels like a further outpatient ischemic evaluation is warranted since he is quite complicated.    Nathan Barrett, M.D., M.S. Interventional Cardiologist   Pager # [phone] Phone # [phone] [address] [place], [place] [address]

## 2017-10-06 NOTE — H&P (Signed)
History and Physical    Nathan Barrett ZWC:[id number] DOB: 10-30-1965 DOA: 10/06/2017  PCP: Patient, No Pcp Per  Patient coming from: Home  I have personally briefly reviewed patient's old medical records in [place]  Chief Complaint: Chest pain  HPI: Nathan Barrett is a 52 y.o. male with medical history significant of mild AS, HTN, CAD: severe diffuse triple vessel disease on LHC in 2015, no good revascularization targets, medical management only.  Patient presents to the ED with c/o 7/10 severity, chest pain.  Located substernal.  Sharp in quality.  Associated dizziness and nausea.  Constant since onset yesterday.   ED Course: EKG unchanged from prior.  Trop neg x1.   Review of Systems: As per HPI otherwise 10 point review of systems negative.   Past Medical History:  Diagnosis Date  . Aortic stenosis    Mild  . Bradycardia   . Coronary artery disease   . H. pylori infection   . Hypercholesterolemia   . Hypertension     Past Surgical History:  Procedure Laterality Date  . CARDIAC CATHETERIZATION     Ejection Fraction 65%  . MASS EXCISION N/A 04/05/2016   Procedure: EXCISION OF LOWER LIP BENIGN LESION LESS THAN 1 CM AND LAYERED CLOSURE LIP 1.5 CM. ;  Surgeon: Irene Limbo, MD;  Location: [place];  Service: Plastics;  Laterality: N/A;  . WISDOM TOOTH EXTRACTION       reports that he has never smoked. He has never used smokeless tobacco. He reports that he does not drink alcohol or use drugs.  No Known Allergies  Family History  Problem Relation Age of Onset  . Leukemia Mother   . Heart disease Father        CABG  . Obstructive Sleep Apnea Brother      Prior to Admission medications   Medication Sig Start Date End Date Taking? Authorizing Provider  amLODipine (NORVASC) 5 MG tablet TAKE 1 TABLET DAILY 07/08/17  Yes Martinique, Peter M, MD  atorvastatin (LIPITOR) 80 MG tablet TAKE 1 TABLET DAILY 07/08/17  Yes Martinique, Peter M, MD    clopidogrel (PLAVIX) 75 MG tablet TAKE 1 TABLET DAILY 07/08/17  Yes Martinique, Peter M, MD  Multiple Vitamin (MULTIVITAMIN WITH MINERALS) TABS tablet Take 1 tablet by mouth daily.   Yes [provider]  olmesartan-hydrochlorothiazide (BENICAR HCT) 20-12.5 MG tablet TAKE 1 TABLET DAILY 07/08/17  Yes Martinique, Peter M, MD    Physical Exam: Vitals:   10/06/17 0415 10/06/17 0430 10/06/17 0445 10/06/17 0500  BP: (!) 131/92 132/88 130/84 131/82  Pulse: (!) 58 (!) 56  (!) 52  Resp: 19 (!) 21 18 17   Temp:      TempSrc:      SpO2: 100% 98% 98% 99%  Weight:      Height:        Constitutional: NAD, calm, comfortable Eyes: PERRL, lids and conjunctivae normal ENMT: Mucous membranes are moist. Posterior pharynx clear of any exudate or lesions.Normal dentition.  Neck: normal, supple, no masses, no thyromegaly Respiratory: clear to auscultation bilaterally, no wheezing, no crackles. Normal respiratory effort. No accessory muscle use.  Cardiovascular: Regular rate and rhythm, Murmur Abdomen: no tenderness, no masses palpated. No hepatosplenomegaly. Bowel sounds positive.  Musculoskeletal: no clubbing / cyanosis. No joint deformity upper and lower extremities. Good ROM, no contractures. Normal muscle tone.  Skin: no rashes, lesions, ulcers. No induration Neurologic: CN 2-12 grossly intact. Sensation intact, DTR normal. Strength 5/5 in all  4.  Psychiatric: Normal judgment and insight. Alert and oriented x 3. Normal mood.    Labs on Admission: I have personally reviewed following labs and imaging studies  CBC: Recent Labs  Lab 10/06/17 0410  WBC 5.2  HGB 14.9  HCT 46.2  MCV 87.3  PLT 476   Basic Metabolic Panel: Recent Labs  Lab 10/06/17 0410  NA 140  K 3.7  CL 104  CO2 28  GLUCOSE 104*  BUN 15  CREATININE 1.31*  CALCIUM 9.2   GFR: Estimated Creatinine Clearance: 64.1 mL/min (A) (by C-G formula based on SCr of 1.31 mg/dL (H)). Liver Function Tests: No results for input(s):  AST, ALT, ALKPHOS, BILITOT, PROT, ALBUMIN in the last 168 hours. No results for input(s): LIPASE, AMYLASE in the last 168 hours. No results for input(s): AMMONIA in the last 168 hours. Coagulation Profile: No results for input(s): INR, PROTIME in the last 168 hours. Cardiac Enzymes: No results for input(s): CKTOTAL, CKMB, CKMBINDEX, TROPONINI in the last 168 hours. BNP (last 3 results) No results for input(s): PROBNP in the last 8760 hours. HbA1C: No results for input(s): HGBA1C in the last 72 hours. CBG: No results for input(s): GLUCAP in the last 168 hours. Lipid Profile: No results for input(s): CHOL, HDL, LDLCALC, TRIG, CHOLHDL, LDLDIRECT in the last 72 hours. Thyroid Function Tests: No results for input(s): TSH, T4TOTAL, FREET4, T3FREE, THYROIDAB in the last 72 hours. Anemia Panel: No results for input(s): VITAMINB12, FOLATE, FERRITIN, TIBC, IRON, RETICCTPCT in the last 72 hours. Urine analysis: No results found for: COLORURINE, APPEARANCEUR, LABSPEC, PHURINE, GLUCOSEU, HGBUR, BILIRUBINUR, KETONESUR, PROTEINUR, UROBILINOGEN, NITRITE, LEUKOCYTESUR  Radiological Exams on Admission: Dg Chest 2 View  Result Date: 10/06/2017 CLINICAL DATA:  52 year old male with chest pain. EXAM: CHEST - 2 VIEW COMPARISON:  Chest radiograph dated 11/08/2008 FINDINGS: The heart size and mediastinal contours are within normal limits. Both lungs are clear. The visualized skeletal structures are unremarkable. IMPRESSION: No active cardiopulmonary disease. Electronically Signed   By: Anner Crete M.D.   On: 10/06/2017 05:15    EKG: Independently reviewed.  Assessment/Plan Principal Problem:   Chest pain, rule out acute myocardial infarction Active Problems:   Coronary artery disease   Aortic stenosis   Hypercholesterolemia   Hypertension    1. CP r/o - in setting of known severe triple vessel disease 1. CP obs pathway 2. EDP spoke with cards on call, they will see in AM 3. ASA 324 given in  ED 4. Continue plavix 5. Serial trops 6. Tele monitor 7. NPO 2. HTN - Continue home BP meds 3. HLD - continue statin 4. Aortic Stenosis - Mild as of June 2018 echo  DVT prophylaxis: Lovenox [place] Code Status: Full Family Communication: Wife at bedside Disposition Plan: Home after admit Consults called: EDP spoke with cardiology Admission status: Place in [place], [place] [facility] Pager [phone] Only works nights!  If 7AM-7PM, please contact the primary day team physician taking care of patient  [email or web address] Password [facility]  10/06/2017, 5:47 AM

## 2017-10-06 NOTE — ED Provider Notes (Signed)
[facility] EMERGENCY DEPARTMENT Provider Note   CSN: [id number] Arrival date & time: 10/06/17  0358     History   Chief Complaint Chief Complaint  Patient presents with  . Chest Pain    HPI Nathan Barrett is a 52 y.o. male.  HPI  52 year old male with history of mild aortic stenosis, CAD that is being medically managed comes in with chief complaint of chest pain and dizziness.  Patient reports that he woke up yesterday feeling dizzy and with some neck pain.  His neck pain resolved, however he continues to have intermittent episodes of spinning sensation.  Patient's dizziness has no specific evoking or aggravating factors.  Each episode lasts for about 30 seconds, and resolves spontaneously.  Patient has no associated vision changes, numbness, tingling, slurred speech, focal weakness.  Additionally, patient also started having some chest discomfort later in the day.  Chest pain is described as generalized chest discomfort and tightness which is similar to his angina that led to stress test in the past.  Patient denies any associated shortness of breath.  The pain is not exertional, pleuritic.  There is waxing and waning intensity and the pain.  Additionally, patient reports that he has had couple of episodes where he felt like he might faint -especially if he is standing up from bending position.  Past Medical History:  Diagnosis Date  . Aortic stenosis    Mild  . Bradycardia   . Coronary artery disease   . H. pylori infection   . Hypercholesterolemia   . Hypertension     Patient Active Problem List   Diagnosis Date Noted  . Chest pain, rule out acute myocardial infarction 10/06/2017  . Syncope 04/09/2012  . Coronary artery disease   . Aortic stenosis   . Hypercholesterolemia   . Hypertension     Past Surgical History:  Procedure Laterality Date  . CARDIAC CATHETERIZATION     Ejection Fraction 65%  . MASS EXCISION N/A 04/05/2016   Procedure:  EXCISION OF LOWER LIP BENIGN LESION LESS THAN 1 CM AND LAYERED CLOSURE LIP 1.5 CM. ;  Surgeon: Irene Limbo, MD;  Location: [facility];  Service: Plastics;  Laterality: N/A;  . WISDOM TOOTH EXTRACTION          Home Medications    Prior to Admission medications   Medication Sig Start Date End Date Taking? Authorizing Provider  amLODipine (NORVASC) 5 MG tablet TAKE 1 TABLET DAILY 07/08/17  Yes Martinique, Peter M, MD  atorvastatin (LIPITOR) 80 MG tablet TAKE 1 TABLET DAILY 07/08/17  Yes Martinique, Peter M, MD  clopidogrel (PLAVIX) 75 MG tablet TAKE 1 TABLET DAILY 07/08/17  Yes Martinique, Peter M, MD  Multiple Vitamin (MULTIVITAMIN WITH MINERALS) TABS tablet Take 1 tablet by mouth daily.   Yes [provider]  olmesartan-hydrochlorothiazide (BENICAR HCT) 20-12.5 MG tablet TAKE 1 TABLET DAILY 07/08/17  Yes Martinique, Peter M, MD    Family History Family History  Problem Relation Age of Onset  . Leukemia Mother   . Heart disease Father        CABG  . Obstructive Sleep Apnea Brother     Social History Social History   Tobacco Use  . Smoking status: Never Smoker  . Smokeless tobacco: Never Used  Substance Use Topics  . Alcohol use: No    Alcohol/week: 0.0 standard drinks  . Drug use: No     Allergies   Patient has no known allergies.   Review  of Systems Review of Systems  Constitutional: Positive for activity change.  Respiratory: Positive for chest tightness.   Gastrointestinal: Negative for nausea and vomiting.  Neurological: Positive for dizziness and light-headedness. Negative for syncope, speech difficulty, weakness, numbness and headaches.  All other systems reviewed and are negative.    Physical Exam Updated Vital Signs BP 131/82   Pulse (!) 52   Temp 97.7 F (36.5 C) (Oral)   Resp 17   Ht 5\' 5"  (1.651 m)   Wt 81.6 kg   SpO2 99%   BMI 29.95 kg/m   Physical Exam  Constitutional: He is oriented to person, place, and time. He appears  well-developed.  HENT:  Head: Atraumatic.  Neck: Neck supple.  Cardiovascular: Normal rate, intact distal pulses and normal pulses.  Pulmonary/Chest: Effort normal.  Musculoskeletal:       Right lower leg: He exhibits no tenderness and no edema.       Left lower leg: He exhibits no tenderness and no edema.  Neurological: He is alert and oriented to person, place, and time.  Cerebellar exam is normal (finger to nose) Sensory exam normal for bilateral upper and lower extremities - and patient is able to discriminate between sharp and dull. Motor exam is 4+/5   Skin: Skin is warm.  Nursing note and vitals reviewed.    ED Treatments / Results  Labs (all labs ordered are listed, but only abnormal results are displayed) Labs Reviewed  BASIC METABOLIC PANEL - Abnormal; Notable for the following components:      Result Value   Glucose, Bld 104 (*)    Creatinine, Ser 1.31 (*)    All other components within normal limits  CBC  HIV ANTIBODY (ROUTINE TESTING)  TROPONIN I  TROPONIN I  TROPONIN I  I-STAT TROPONIN, ED    EKG EKG Interpretation  Date/Time:  Monday October 06 2017 04:06:57 EDT Ventricular Rate:  62 PR Interval:    QRS Duration: 108 QT Interval:  435 QTC Calculation: 442 R Axis:   22 Text Interpretation:  Sinus rhythm Abnormal R-wave progression, early transition ST elevation, consider inferior injury No acute changes No significant change since last tracing Confirmed by Varney Biles ([id number]) on 10/06/2017 4:24:12 AM   Radiology Dg Chest 2 View  Result Date: 10/06/2017 CLINICAL DATA:  52 year old male with chest pain. EXAM: CHEST - 2 VIEW COMPARISON:  Chest radiograph dated 11/08/2008 FINDINGS: The heart size and mediastinal contours are within normal limits. Both lungs are clear. The visualized skeletal structures are unremarkable. IMPRESSION: No active cardiopulmonary disease. Electronically Signed   By: Anner Crete M.D.   On: 10/06/2017 05:15     Procedures Procedures (including critical care time)  Medications Ordered in ED Medications  meclizine (ANTIVERT) tablet 50 mg (has no administration in time range)  aspirin chewable tablet 324 mg (has no administration in time range)  acetaminophen (TYLENOL) tablet 650 mg (has no administration in time range)  ondansetron (ZOFRAN) injection 4 mg (has no administration in time range)  amLODipine (NORVASC) tablet 5 mg (has no administration in time range)  atorvastatin (LIPITOR) tablet 80 mg (has no administration in time range)  clopidogrel (PLAVIX) tablet 75 mg (has no administration in time range)  multivitamin with minerals tablet 1 tablet (has no administration in time range)  olmesartan-hydrochlorothiazide (BENICAR HCT) 20-12.5 MG per tablet 1 tablet (has no administration in time range)  enoxaparin (LOVENOX) injection 40 mg (has no administration in time range)  morphine 2 MG/ML injection 2 mg (  has no administration in time range)  nitroGLYCERIN (NITROGLYN) 2 % ointment 1 inch (1 inch Topical Given 10/06/17 0550)  morphine 4 MG/ML injection 4 mg (4 mg Intravenous Given 10/06/17 0549)     Initial Impression / Assessment and Plan / ED Course  I have reviewed the triage vital signs and the nursing notes.  Pertinent labs & imaging results that were available during my care of the patient were reviewed by me and considered in my medical decision making (see chart for details).  Clinical Course as of Oct 06 553  Mon Oct 06, 2017  9030 I discussed the case with Dr. De Nurse, cardiology. He would appreciate if patient is admitted by medicine.  He will relay patient's information to the morning team, who will follow up with the patient.   [AN]    Clinical Course User Index [AN] Varney Biles, MD    52 year old male comes in with chief complaint of chest pain per Patient has known obstructive coronary artery disease that is being medically managed since it is not amenable to  intervention.  He reports that his current pain is similar to his angina that led to his previous cath.  C[facility]ter[facility] from [facility] at [facility] has been reviewed, and it appears that patient had three-vessel disease.  Although the chest pain is atypical, given that patient reports that it is similar to his prior angina, and he typically does not get chest pain -we think patient will need further assessment by cardiology team.  Based on history and exam, we do not think patient has PE, aortic dissection, vertebrobasilar dissection. Patient has history of aortic stenosis, however it is only mild -therefore we do not think that is contributing to the chest pain and the dizziness and near fainting.  As far as the vertigo-like symptoms are concerned, they appear to be peripheral in nature because of intermittent, severe nature of them.  Neurologic exam is nonfocal.  This does not appear to be a central vertigo, no neurologic work-up will be undertaken.  We will give patient Antivert at this time.  He has been advised to let us know if his vertigo becomes constant.  Final Clinical Impressions(s) / ED Diagnoses   Final diagnoses:  Angina pectoris [facility])  Peripheral vertigo, unspecified laterality    ED Discharge Orders    None       Varney Biles, MD 10/06/17 [phone]

## 2017-10-06 NOTE — ED Triage Notes (Signed)
Patient presents to the ED from home with complaints of substernal chest that has been constant since yesterday . Patient states associated with dizziness that has been intermitted. Patient denise taking any medication for pain. Patient rates pain 7/10 describes as sharp. Patient denies any vomiting states feels nausea.

## 2017-10-06 NOTE — Progress Notes (Signed)
Patient arrived to the floor, alert, oriented and ambulatory. Patient denies pain, CHG bath applied Heart monitor applied and CCMD notified.

## 2017-10-06 NOTE — ED Notes (Signed)
No chest pain at this time 

## 2017-10-07 ENCOUNTER — Other Ambulatory Visit: Payer: Self-pay

## 2017-10-07 ENCOUNTER — Encounter (HOSPITAL_COMMUNITY): Payer: Self-pay | Admitting: *Deleted

## 2017-10-07 DIAGNOSIS — R42 Dizziness and giddiness: Secondary | ICD-10-CM | POA: Diagnosis not present

## 2017-10-07 DIAGNOSIS — I25118 Atherosclerotic heart disease of native coronary artery with other forms of angina pectoris: Secondary | ICD-10-CM | POA: Diagnosis not present

## 2017-10-07 DIAGNOSIS — I10 Essential (primary) hypertension: Secondary | ICD-10-CM | POA: Diagnosis not present

## 2017-10-07 DIAGNOSIS — R079 Chest pain, unspecified: Secondary | ICD-10-CM | POA: Diagnosis not present

## 2017-10-07 MED ORDER — AMLODIPINE BESYLATE 10 MG PO TABS: 10.0000 mg | ORAL_TABLET | Freq: Every day | ORAL | 0 refills | Status: DC

## 2017-10-07 NOTE — Progress Notes (Signed)
HR drops down to 50s and 40s while sleeping.  Will inform on coming nurse.  Lupita Dawn, RN

## 2017-10-07 NOTE — Plan of Care (Signed)
Care plans reviewed and patient is progressing.  

## 2017-10-07 NOTE — Progress Notes (Signed)
Mr and Mrs Mccallum given discharge instructions.  Discussed new medications and medication changes.  Discussed follow up appointments and activities.  Discussed signs and symptoms to watch for and when to contact the physician.  Verbalized understanding.

## 2017-10-07 NOTE — Discharge Summary (Signed)
DISCHARGE SUMMARY  Nathan Barrett  MR#: [id number]  DOB:09/09/1965  Date of Admission: 10/06/2017 Date of Discharge: 10/07/2017  Attending Physician:Lizzete Gough Hennie Duos, MD  Patient's OIL:[id number], No Pcp Per  Consults:  [facility]   Disposition: D/C home   Follow-up Appts: Follow-up Information    Martinique, Peter M, MD Follow up in 10 day(s).   Specialty:  Cardiology Contact information: [address] [place] [address] [phone]           Discharge Diagnoses: Chest pain HTN HLD  Initial presentation: 52 y.o.malewith a hx of?mild AoS, HTN, and CAD (severe diffuse triple vessel disease on LHC in 2015 w/ no good revascularization targets > medical management only) who presented to the ED with substernal chest pain, dizziness, and nausea.   Hospital Course: The patient was admitted to the acute units for evaluation of his chest pain.  He ruled out for acute coronary syndrome with serial troponins and EKGs.  Cardiology was consulted to evaluate the patient.  A transthoracic echocardiogram was accomplished which revealed no focal wall motion abnormalities and did not notice evidence of aortic stenosis.  He was found to have an ejection fraction of 60-65% and a grade 1 diastolic dysfunction.  Adjustments were made in the patient's medical regimen by the cardiology service out of concern that his pain could have been related to small vessel disease.  By 8/27 the patient was clinically stable and anxious to be discharged home.  Cardiology summed up his future treatment plan as follows: "Chest pain of unclear etiology.  Low risk for cardiac etiology. --Does not sound anginal in nature.  Was resting but not exertional.  Currently chest pain-free. Echocardiogram normal. Medication adjustments...: Increased amlodipine and decreased ARB for arterial dilation for possible antianginal benefit. Unable to use beta-blocker due to bradycardia.  He does have some  nighttime bradycardia probably more related to apnea as opposed to true AV nodal disease.  I do not think this low level of bradycardia would be causing his intermittent dizziness as he is unable to really tell us any correlation with timing of dizziness and has been having heart rates in the 50s the entire time in the hospital."  Allergies as of 10/07/2017   No Known Allergies     Medication List    TAKE these medications   amLODipine 10 MG tablet Commonly known as:  NORVASC Take 1 tablet (10 mg total) by mouth daily. Start taking on:  10/08/2017 What changed:    medication strength  how much to take   atorvastatin 80 MG tablet Commonly known as:  LIPITOR TAKE 1 TABLET DAILY   clopidogrel 75 MG tablet Commonly known as:  PLAVIX TAKE 1 TABLET DAILY   multivitamin with minerals Tabs tablet Take 1 tablet by mouth daily.   olmesartan-hydrochlorothiazide 20-12.5 MG tablet Commonly known as:  BENICAR HCT TAKE 1 TABLET DAILY       Day of Discharge BP (!) 130/91   Pulse (!) 52   Temp 97.9 F (36.6 C) (Axillary)   Resp 20   Ht 5\' 5"  (1.651 m)   Wt 87.6 kg   SpO2 99%   BMI 32.15 kg/m   Physical Exam: General: No acute respiratory distress Lungs: Clear to auscultation bilaterally without wheezes or crackles Cardiovascular: Regular rate and rhythm without murmur gallop or rub normal S1 and S2 Abdomen: Nontender, nondistended, soft, bowel sounds positive, no rebound, no ascites, no appreciable mass Extremities: No significant cyanosis, clubbing, or edema bilateral lower  extremities  Basic Metabolic Panel: Recent Labs  Lab 10/06/17 0410  NA 140  K 3.7  CL 104  CO2 28  GLUCOSE 104*  BUN 15  CREATININE 1.31*  CALCIUM 9.2   CBC: Recent Labs  Lab 10/06/17 0410  WBC 5.2  HGB 14.9  HCT 46.2  MCV 87.3  PLT 236    Cardiac Enzymes: Recent Labs  Lab 10/06/17 0533 10/06/17 0955 10/06/17 1237 10/06/17 1521  TROPONINI <0.03 <0.03 <0.03 <0.03   Time spent  in discharge (includes decision making & examination of pt): 30 minutes  10/07/2017, 2:11 PM   Cherene Altes, MD [facility] Office  [phone] Pager [phone]  On-Call/Text Page:      [email or web address]      password [facility]

## 2017-10-07 NOTE — Progress Notes (Signed)
  [facility] TEAM 1 - Stepdown/ICU TEAM  10/07/17   To Whom it may concern,  Nathan Barrett was admitted to [facility] on 10/06/17 and remained under my care in the hospital through 10/07/17.  He has been advised that he should not return to work until 10/13/17, at which time he will be cleared to resume all of his usual responsibilities.  Sincerely,  Cherene Altes, MD [facility] Office  [phone]

## 2017-10-07 NOTE — Plan of Care (Signed)
Patient is adequate for discharge.  

## 2017-10-07 NOTE — Discharge Instructions (Signed)

## 2017-10-07 NOTE — Progress Notes (Signed)
Progress Note  Patient Name: Nathan Barrett Date of Encounter: 10/07/2017  Primary Cardiologist: Peter Martinique, MD   Subjective   Feels much better today.  No more dizziness.  No more chest pain.  Inpatient Medications    Scheduled Meds: . amLODipine  10 mg Oral Daily  . atorvastatin  80 mg Oral Daily  . clopidogrel  75 mg Oral Daily  . enoxaparin (LOVENOX) injection  40 mg Subcutaneous Q24H  . irbesartan  75 mg Oral Daily   And  . hydrochlorothiazide  12.5 mg Oral Daily  . multivitamin with minerals  1 tablet Oral Daily   Continuous Infusions:  PRN Meds: acetaminophen, morphine injection, ondansetron (ZOFRAN) IV   Vital Signs    Vitals:   10/06/17 2032 10/07/17 0021 10/07/17 0414 10/07/17 1109  BP: 103/73 103/72 130/72 (!) 145/82  Pulse:      Resp: 14 (!) 22 10 18   Temp: 97.8 F (36.6 C) 97.8 F (36.6 C)  (!) 97.1 F (36.2 C)  TempSrc: Oral Oral  Axillary  SpO2: 95% 98% 99% 99%  Weight:      Height:        Intake/Output Summary (Last 24 hours) at 10/07/2017 1143 Last data filed at 10/06/2017 2200 Gross per 24 hour  Intake 200 ml  Output -  Net 200 ml   Filed Weights   10/06/17 0408 10/06/17 0621  Weight: 81.6 kg 87.6 kg    Telemetry    Sinus rhythm and sinus bradycardia.  Heart rates did go down into the high 40s while sleeping.- Personally Reviewed  ECG    No new study- Personally Reviewed  Physical Exam   GEN: No acute distress.   Neck: No JVD Cardiac:  Bradycardic regular rhythm, soft SEM at RUSB.  Otherwise no rubs, or gallops.  Respiratory: Clear to auscultation bilaterally.  Nonlabored.  Good air movement GI: Soft, nontender, non-distended  MS: No edema; No deformity. Neuro:  Nonfocal  Psych: Normal affect   Labs    Chemistry Recent Labs  Lab 10/06/17 0410  NA 140  K 3.7  CL 104  CO2 28  GLUCOSE 104*  BUN 15  CREATININE 1.31*  CALCIUM 9.2  GFRNONAA >60  GFRAA >60  ANIONGAP 8     Hematology Recent Labs  Lab  10/06/17 0410  WBC 5.2  RBC 5.29  HGB 14.9  HCT 46.2  MCV 87.3  MCH 28.2  MCHC 32.3  RDW 13.7  PLT 236    Cardiac Enzymes Recent Labs  Lab 10/06/17 0533 10/06/17 0955 10/06/17 1237 10/06/17 1521  TROPONINI <0.03 <0.03 <0.03 <0.03    Recent Labs  Lab 10/06/17 0428  TROPIPOC 0.02     BNPNo results for input(s): BNP, PROBNP in the last 168 hours.   DDimer No results for input(s): DDIMER in the last 168 hours.   Radiology    Dg Chest 2 View  Result Date: 10/06/2017 CLINICAL DATA:  52 year old male with chest pain. EXAM: CHEST - 2 VIEW COMPARISON:  Chest radiograph dated 11/08/2008 FINDINGS: The heart size and mediastinal contours are within normal limits. Both lungs are clear. The visualized skeletal structures are unremarkable. IMPRESSION: No active cardiopulmonary disease. Electronically Signed   By: Anner Crete M.D.   On: 10/06/2017 05:15    Cardiac Studies   Transthoracic Echo 10/06/2017: Stable compared to June 2018.  Normal EF 60-65%.  Aortic sclerosis but no stenosis.  GR 1 DD.  No RW MA.  No pericardial effusion.  Patient  Profile     JABES PRIMO is a 52 y.o. 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 Alcario Drought.  Assessment & Plan    Chest pain of unclear etiology.  Low risk for cardiac etiology. --Does not sound anginal in nature.  Was resting but not exertional.  Currently chest pain-free. Echocardiogram normal. Medication adjustments for yesterday: Increased amlodipine and decreased ARB for arterial dilation for possible antianginal benefit. Unable to use beta-blocker due to bradycardia.  He does have some nighttime bradycardia probably more related to apnea as opposed to true AV nodal disease.  I do not think this low level of bradycardia would be causing his intermittent dizziness as he is unable to  really tell us any correlation with timing of dizziness and has been having heart rates in the 50s the entire time in the hospital.   [facility] will sign off.   Medication Recommendations:  Continue current meds Other recommendations (labs, testing, etc):  Defer OP evaluation (monitor, ? ST to primary cardiologist). Follow up as an outpatient:  Will try to move up appt. With Dr. Martinique to Sept (is scheduled to see in Oct).   For questions or updates, please contact [place] Please consult [email or web address] for contact info under Cardiology/STEMI.      Signed, Glenetta Hew, MD  10/07/2017, 11:43 AM

## 2017-10-08 NOTE — Progress Notes (Signed)
Cardiology Office Note   Date:  10/09/2017   ID:  Nathan Barrett, Nathan Barrett 1965/06/30, MRN [id number]  PCP:  Patient, No Pcp Per  Cardiologist:   Nathan Corkum Martinique, MD   Chief Complaint  Patient presents with  . Hypertension  . Coronary Artery Disease      History of Present Illness: Nathan Barrett is a 52 y.o. male who is seen for post hospital follow up.  He has a known history of CAD. Had cardiac cath here in 2010 and 2014 showing extensive small vessel CAD with severe disease in several small terminal branches. Not a candidate for intervention. Was treated with Imdur in the past but this was not continued due to side effects ? HA. Not a candidate for beta blocker due to chronic bradycardia. Was evaluated at [facility] in [facility]. Echo was normal. Stress Myoview was done and he was able to walk 8 minutes but developed 8/10 chest pain. HR only up to 121. Small inferolateral perfusion defect noted. Cardiac cath done. Stable findings.  He was started on Ranexa but noted no change in symptoms. When seen in follow up we started him on amlodipine and Ranexa was stopped due to lack of benefit.   He was just admitted overnight from 8/26-8/27/19 for evaluation. He states he experienced acute symptoms on Saturday with dizziness, room spinning, imbalance and later chest pain. This improved on Sunday but then worsened Monday morning.  Echo was unremarkable. He ruled out with negative troponin levels. Symptoms improved. His amlodipine dose was increased for angina.  On follow up today he reports his chest pain resolved. Still notes some dizziness and room spinning but not as bad. No sinus congestion. Has a mild cough.  No HA or other neurologic symptoms.   Past Medical History:  Diagnosis Date  . Aortic stenosis    Mild  . Bradycardia   . CKD (chronic kidney disease), stage II   . Coronary artery disease    a. multivessel, treated medically.  . H. pylori infection   . Hypercholesterolemia   .  Hypertension     Past Surgical History:  Procedure Laterality Date  . CARDIAC CATHETERIZATION     Ejection Fraction 65%  . MASS EXCISION N/A 04/05/2016   Procedure: EXCISION OF LOWER LIP BENIGN LESION LESS THAN 1 CM AND LAYERED CLOSURE LIP 1.5 CM. ;  Surgeon: Nathan Limbo, MD;  Location: [name];  Service: Plastics;  Laterality: N/A;  . WISDOM TOOTH EXTRACTION       Current Outpatient Medications  Medication Sig Dispense Refill  . amLODipine (NORVASC) 10 MG tablet Take 1 tablet (10 mg total) by mouth daily. 30 tablet 0  . atorvastatin (LIPITOR) 80 MG tablet TAKE 1 TABLET DAILY 90 tablet 0  . clopidogrel (PLAVIX) 75 MG tablet TAKE 1 TABLET DAILY 90 tablet 0  . Multiple Vitamin (MULTIVITAMIN WITH MINERALS) TABS tablet Take 1 tablet by mouth daily.    Marland Kitchen olmesartan-hydrochlorothiazide (BENICAR HCT) 20-12.5 MG tablet TAKE 1 TABLET DAILY 90 tablet 0   No current facility-administered medications for this visit.     Allergies:   Patient has no known allergies.    Social History:  The patient  reports that he has never smoked. He has never used smokeless tobacco. He reports that he does not drink alcohol or use drugs.   Family History:  The patient's family history includes Heart disease in his father; Leukemia in his mother; Obstructive Sleep Apnea in his brother.  ROS:  Please see the history of present illness.   Otherwise, review of systems are positive for none.   All other systems are reviewed and negative.    PHYSICAL EXAM: VS:  BP 116/68 (BP Location: Right Arm, Patient Position: Sitting, Cuff Size: Normal)   Pulse (!) 56   Ht 5\' 5"  (1.651 m)   Wt 189 lb (85.7 kg)   BMI 31.45 kg/m  , BMI Body mass index is 31.45 kg/m. GENERAL:  Well appearing BM in NAD HEENT:  PERRL, EOMI, sclera are clear. Oropharynx is clear. NECK:  No jugular venous distention, carotid upstroke brisk and symmetric, no bruits, no thyromegaly or adenopathy LUNGS:  Clear to  auscultation bilaterally CHEST:  Unremarkable HEART:  RRR,  PMI not displaced or sustained,S1 and S2 within normal limits, no S3, no S4: no clicks, no rubs, no murmurs ABD:  Soft, nontender. BS +, no masses or bruits. No hepatomegaly, no splenomegaly EXT:  2 + pulses throughout, no edema, no cyanosis no clubbing SKIN:  Warm and dry.  No rashes NEURO:  Alert and oriented x 3. Cranial nerves II through XII intact. PSYCH:  Cognitively intact  \   EKG:  EKG is not ordered today  Recent Labs: 10/06/2017: BUN 15; Creatinine, Ser 1.31; Hemoglobin 14.9; Platelets 236; Potassium 3.7; Sodium 140; TSH 1.756    Lipid Panel    Component Value Date/Time   CHOL 120 07/12/2016 1602   TRIG 70 07/12/2016 1602   HDL 40 07/12/2016 1602   CHOLHDL 3.0 07/12/2016 1602   CHOLHDL 3 11/23/2010 1015   VLDL 16.6 11/23/2010 1015   LDLCALC 66 07/12/2016 1602      Wt Readings from Last 3 Encounters:  10/09/17 189 lb (85.7 kg)  10/06/17 193 lb 3.2 oz (87.6 kg)  07/12/16 184 lb 9.6 oz (83.7 kg)      Other studies Reviewed:  Cardiac cath films reviewed from [facility] 2015 Review of the above records demonstrates:   I have personally reviewed images.  There is diffuse 50% disease in the proximal and mid LAD. The distal LAD is occluded at the apex.  There is a large ramus intermediate branch with a critical ostial stenosis 99%. There is a 90% stenosis at the bifurcation of the ramus into 2 moderate sized branches. The LCx is small and has 80% ostial stenosis with occlusion of a small distal branch. The RCA has 50% mid vessel disease. The PDA is occluded and there is a 90% stenosis at the origin of the PLOM.  Echo 10/06/17: Study Conclusions  - Left ventricle: The cavity size was normal. Wall thickness was   normal. Systolic function was normal. The estimated ejection   fraction was in the range of 60% to 65%. Wall motion was normal;   there were no regional wall motion abnormalities. Doppler   parameters  are consistent with abnormal left ventricular   relaxation (grade 1 diastolic dysfunction). The E/e&' ratio is   between 8-15, suggesting indeterminate LV filling pressure. - Aortic valve: Trileaflet. Sclerosis without stenosis. There was   no regurgitation. - Mitral valve: Mildly thickened leaflets . There was trivial   regurgitation. - Left atrium: The atrium was normal in size. - Right atrium: The atrium was mildly dilated. - Inferior vena cava: The vessel was normal in size. The   respirophasic diameter changes were in the normal range (>= 50%),   consistent with normal central venous pressure.  Impressions:  - Compared to a prior study in 07/2016,  the LVEF is stable at   60-65%. There is no significant aortic stenosis.   ASSESSMENT AND PLAN:  1.  CAD with stable class 2 angina. Patient with known small vessel CAD.  Much of his disease involves small distal branches that are not amenable to revascularization. The ramus intermediate has severe ostial and mid vessel disease but PCI would be difficult with ostial location and potential plaque shift. The mid vessel lesion is at a bifurcation. His angiogram in 2015 was not significantly changed from 2010. Continue medical therapy with amlodipine.  He is not a candidate for beta blocker due to bradycardia. Imdur in the past was not tolerated. Ranexa has not resulted in any improvement in symptoms. He is currently with class 1-2 symptoms.   2. HTN. Well controlled.   3. Hypercholesterolemia.  Excellent control on high dose lipitor.   4. Vertigo. Suspect BPV. Symptoms improving so no indication for medical therapy with meclizine or vestibular training. Monitor for now. No other Neurologic symtpoms   Current medicines are reviewed at length with the patient today.  The patient does not have concerns regarding medicines.  The following changes have been made:  See above  Labs/ tests ordered today include:   No orders of the defined  types were placed in this encounter.     Signed, Terra Aveni Martinique, MD  10/09/2017 6:08 PM    [facility] [address], [place], [place], [address] Phone [phone], Fax [phone]

## 2017-10-09 ENCOUNTER — Encounter: Payer: Self-pay | Admitting: Cardiology

## 2017-10-09 ENCOUNTER — Ambulatory Visit (INDEPENDENT_AMBULATORY_CARE_PROVIDER_SITE_OTHER): Payer: BLUE CROSS/BLUE SHIELD | Admitting: Cardiology

## 2017-10-09 VITALS — BP 116/68 | HR 56 | Ht 65.0 in | Wt 189.0 lb

## 2017-10-09 DIAGNOSIS — I25118 Atherosclerotic heart disease of native coronary artery with other forms of angina pectoris: Secondary | ICD-10-CM | POA: Diagnosis not present

## 2017-10-09 DIAGNOSIS — H8110 Benign paroxysmal vertigo, unspecified ear: Secondary | ICD-10-CM

## 2017-10-09 DIAGNOSIS — E7800 Pure hypercholesterolemia, unspecified: Secondary | ICD-10-CM | POA: Diagnosis not present

## 2017-10-09 NOTE — Patient Instructions (Addendum)
Continue your current therapy  I will see you in 6 months.   

## 2017-11-22 DIAGNOSIS — Z23 Encounter for immunization: Secondary | ICD-10-CM | POA: Diagnosis not present

## 2017-11-22 DIAGNOSIS — J0140 Acute pansinusitis, unspecified: Secondary | ICD-10-CM | POA: Diagnosis not present

## 2017-11-24 ENCOUNTER — Ambulatory Visit: Payer: BLUE CROSS/BLUE SHIELD | Admitting: Cardiology

## 2017-11-24 ENCOUNTER — Encounter

## 2017-12-19 ENCOUNTER — Encounter (HOSPITAL_COMMUNITY): Payer: Self-pay

## 2017-12-19 ENCOUNTER — Other Ambulatory Visit: Payer: Self-pay

## 2017-12-19 ENCOUNTER — Emergency Department (HOSPITAL_COMMUNITY)
Admission: EM | Admit: 2017-12-19 | Discharge: 2017-12-19 | Disposition: A | Discharge status: Home / Self Care | Payer: BLUE CROSS/BLUE SHIELD | Attending: Emergency Medicine | Admitting: Emergency Medicine

## 2017-12-19 ENCOUNTER — Emergency Department (HOSPITAL_COMMUNITY): Payer: BLUE CROSS/BLUE SHIELD

## 2017-12-19 DIAGNOSIS — I2510 Atherosclerotic heart disease of native coronary artery without angina pectoris: Secondary | ICD-10-CM | POA: Insufficient documentation

## 2017-12-19 DIAGNOSIS — N182 Chronic kidney disease, stage 2 (mild): Secondary | ICD-10-CM | POA: Insufficient documentation

## 2017-12-19 DIAGNOSIS — Z7902 Long term (current) use of antithrombotics/antiplatelets: Secondary | ICD-10-CM | POA: Diagnosis not present

## 2017-12-19 DIAGNOSIS — I129 Hypertensive chronic kidney disease with stage 1 through stage 4 chronic kidney disease, or unspecified chronic kidney disease: Secondary | ICD-10-CM | POA: Diagnosis not present

## 2017-12-19 DIAGNOSIS — R0789 Other chest pain: Secondary | ICD-10-CM | POA: Diagnosis not present

## 2017-12-19 DIAGNOSIS — R079 Chest pain, unspecified: Secondary | ICD-10-CM | POA: Insufficient documentation

## 2017-12-19 DIAGNOSIS — R05 Cough: Secondary | ICD-10-CM | POA: Diagnosis not present

## 2017-12-19 DIAGNOSIS — Z79899 Other long term (current) drug therapy: Secondary | ICD-10-CM | POA: Diagnosis not present

## 2017-12-19 LAB — CBC
HEMATOCRIT: 47.7 % (ref 39.0–52.0)
Hemoglobin: 15.1 g/dL (ref 13.0–17.0)
MCH: 26.9 pg (ref 26.0–34.0)
MCHC: 31.7 g/dL (ref 30.0–36.0)
MCV: 84.9 fL (ref 80.0–100.0)
PLATELETS: 255 10*3/uL (ref 150–400)
RBC: 5.62 MIL/uL (ref 4.22–5.81)
RDW: 13.2 % (ref 11.5–15.5)
WBC: 6.3 10*3/uL (ref 4.0–10.5)
nRBC: 0 % (ref 0.0–0.2)

## 2017-12-19 LAB — BASIC METABOLIC PANEL
Anion gap: 10 (ref 5–15)
BUN: 13 mg/dL (ref 6–20)
CALCIUM: 9.5 mg/dL (ref 8.9–10.3)
CO2: 28 mmol/L (ref 22–32)
Chloride: 101 mmol/L (ref 98–111)
Creatinine, Ser: 1.31 mg/dL — ABNORMAL HIGH (ref 0.61–1.24)
GFR calc non Af Amer: >60 mL/min (ref >60)
Glucose, Bld: 109 mg/dL — ABNORMAL HIGH (ref 70–99)
Potassium: 3.6 mmol/L (ref 3.5–5.1)
Sodium: 139 mmol/L (ref 135–145)

## 2017-12-19 LAB — I-STAT TROPONIN, ED
Troponin i, poc: 0 ng/mL (ref 0.00–0.08)
Troponin i, poc: 0 ng/mL (ref 0.00–0.08)

## 2017-12-19 MED ORDER — FENTANYL CITRATE (PF) 100 MCG/2ML IJ SOLN
50.0000 ug | Freq: Once | INTRAMUSCULAR | Status: AC
  Administered 2017-12-19: 50 ug via INTRAVENOUS
  Filled 2017-12-19: qty 2

## 2017-12-19 MED ORDER — ACETAMINOPHEN 325 MG PO TABS
650.0000 mg | ORAL_TABLET | Freq: Once | ORAL | Status: AC
  Administered 2017-12-19: 650 mg via ORAL
  Filled 2017-12-19: qty 2

## 2017-12-19 MED ORDER — METHOCARBAMOL 500 MG PO TABS: 500.0000 mg | ORAL_TABLET | Freq: Three times a day (TID) | ORAL | 0 refills | Status: DC | PRN

## 2017-12-19 MED ORDER — ASPIRIN 81 MG PO CHEW
243.0000 mg | CHEWABLE_TABLET | Freq: Once | ORAL | Status: AC
  Administered 2017-12-19: 243 mg via ORAL
  Filled 2017-12-19: qty 3

## 2017-12-19 MED ORDER — METHOCARBAMOL 500 MG PO TABS
500.0000 mg | ORAL_TABLET | Freq: Once | ORAL | Status: AC
  Administered 2017-12-19: 500 mg via ORAL
  Filled 2017-12-19: qty 1

## 2017-12-19 NOTE — ED Provider Notes (Signed)
[facility] EMERGENCY DEPARTMENT Provider Note   CSN: [id number] Arrival date & time: 12/19/17  0545    History   Chief Complaint Chief Complaint  Patient presents with  . Chest Pain    HPI Nathan Barrett is a 52 y.o. male with a hx of aortic stenosis, bradycardia, HTN, hypercholesterolemia, and CAD who presents to the ED via EMS with complaint of chest pain which started this AM- patient is unsure of exact time. Patient describes the pain as sharp and aching, non radiating to the substernal area. He states the pain started after waking up and is unsure what he was specifically doing when it began, but it has been constant since. Pain is worse with a deep breath, no significant alleviating factors. He states that he was given ASA and NTG en route and that it did not change his pain much. Current discomfort is a 7/10 in severity. He reports associated dyspnea with the chest pain which is resolved at present. He states he got in his car to go to urgent care to be evaluated however in the car he became diaphoretic with a near syncopal sensation, he pulled over and stayed calm and this seemed to resolve, but prompted retuning home to call EMS. Of not he has had 3-4 days of URI sxs including congestion and productive cough with mucous sputum. Denies syncope, nausea, vomiting, dizziness, fever, or chills. Denies leg pain/swelling, hemoptysis, recent surgery/trauma, recent long travel, hormone use, personal hx of cancer, or hx of DVT/PE. Patient reports hx of CAD, no prior stents. States his father had an MI at an early age. He denies hx of tobacco use or diabetes.   Per chart review: Patient is followed by local cardiologist Dr. Martinique, has also been seen at [facility]. Cardiac catheterizations in 2010, 2014, and 2015- all with severe CAD, not deemed a candidate for intervention, medical management. Had recent chest pain obs admission with negative serial troponins.   Patient reports he would  like to avoid morphine specifically due to this drug being utilized during his mother's passing away, relays okay with other narcotic medications.   HPI  Past Medical History:  Diagnosis Date  . Aortic stenosis    Mild  . Bradycardia   . CKD (chronic kidney disease), stage II   . Coronary artery disease    a. multivessel, treated medically.  . H. pylori infection   . Hypercholesterolemia   . Hypertension     Patient Active Problem List   Diagnosis Date Noted  . Syncope 04/09/2012  . Coronary artery disease   . Hypercholesterolemia   . Hypertension     Past Surgical History:  Procedure Laterality Date  . CARDIAC CATHETERIZATION     Ejection Fraction 65%  . MASS EXCISION N/A 04/05/2016   Procedure: EXCISION OF LOWER LIP BENIGN LESION LESS THAN 1 CM AND LAYERED CLOSURE LIP 1.5 CM. ;  Surgeon: Irene Limbo, MD;  Location: [facility];  Service: Plastics;  Laterality: N/A;  . WISDOM TOOTH EXTRACTION          Home Medications    Prior to Admission medications   Medication Sig Start Date End Date Taking? Authorizing Provider  amLODipine (NORVASC) 10 MG tablet Take 1 tablet (10 mg total) by mouth daily. 10/08/17   Cherene Altes, MD  atorvastatin (LIPITOR) 80 MG tablet TAKE 1 TABLET DAILY 10/06/17   Martinique, Peter M, MD  clopidogrel (PLAVIX) 75 MG tablet TAKE 1 TABLET DAILY 10/06/17  Martinique, Peter M, MD  Multiple Vitamin (MULTIVITAMIN WITH MINERALS) TABS tablet Take 1 tablet by mouth daily.    [provider]  olmesartan-hydrochlorothiazide [facility] HCT) 20-12.5 MG tablet TAKE 1 TABLET DAILY 10/06/17   Martinique, Peter M, MD    Family History Family History  Problem Relation Age of Onset  . Leukemia Mother   . Heart disease Father        CABG  . Obstructive Sleep Apnea Brother     Social History Social History   Tobacco Use  . Smoking status: Never Smoker  . Smokeless tobacco: Never Used  Substance Use Topics  . Alcohol use: No     Alcohol/week: 0.0 standard drinks  . Drug use: No     Allergies   Patient has no known allergies.   Review of Systems Review of Systems  Constitutional: Positive for diaphoresis (episode, resolved). Negative for chills and fever.  HENT: Positive for congestion. Negative for ear pain, sore throat and voice change.   Respiratory: Positive for cough and shortness of breath (resolved).   Cardiovascular: Positive for chest pain. Negative for palpitations and leg swelling.  Gastrointestinal: Negative for abdominal pain, diarrhea, nausea and vomiting.  Neurological: Positive for light-headedness (episode, resolved). Negative for dizziness, syncope, weakness, numbness and headaches.  All other systems reviewed and are negative.   Physical Exam Updated Vital Signs BP 107/68 (BP Location: Right Arm)   Pulse (!) 59   Temp 98.2 F (36.8 C)   Ht 5\' 5"  (1.651 m)   Wt 83.9 kg   SpO2 98%   BMI 30.79 kg/m   Physical Exam  Constitutional: He appears well-developed and well-nourished.  Non-toxic appearance. No distress.  HENT:  Head: Normocephalic and atraumatic.  Right Ear: No drainage or swelling. Tympanic membrane is not erythematous, not retracted and not bulging.  Left Ear: No drainage or swelling. Tympanic membrane is not erythematous, not retracted and not bulging.  Nose: Mucosal edema present. Right sinus exhibits no maxillary sinus tenderness and no frontal sinus tenderness. Left sinus exhibits no maxillary sinus tenderness and no frontal sinus tenderness.  Mouth/Throat: Uvula is midline and oropharynx is clear and moist.  Eyes: Conjunctivae are normal. Right eye exhibits no discharge. Left eye exhibits no discharge.  Neck: Neck supple. No neck rigidity. No edema and no erythema present.  Cardiovascular: Normal rate and regular rhythm. Exam reveals no friction rub.  No murmur heard. Pulses:      Radial pulses are 2+ on the right side, and 2+ on the left side.       Dorsalis pedis  pulses are 2+ on the right side, and 2+ on the left side.  Pulmonary/Chest: Effort normal and breath sounds normal. No respiratory distress. He has no wheezes. He has no rhonchi. He has no rales. He exhibits tenderness (lower sternal tenderness to palpation which patient states reproduces pain. no overyling skin changes or underlying palpable crepitus).  Respiration even and unlabored  Abdominal: Soft. He exhibits no distension. There is no tenderness. There is no rigidity, no rebound and no guarding.  Musculoskeletal:       Right lower leg: He exhibits no tenderness and no edema.       Left lower leg: He exhibits no tenderness and no edema.  Neurological: He is alert.  Clear speech.   Skin: Skin is warm and dry. No rash noted.  Psychiatric: He has a normal mood and affect. His behavior is normal.  Nursing note and vitals reviewed.  ED Treatments / Results  Labs (all labs ordered are listed, but only abnormal results are displayed) Labs Reviewed  BASIC METABOLIC PANEL - Abnormal; Notable for the following components:      Result Value   Glucose, Bld 109 (*)    Creatinine, Ser 1.31 (*)    All other components within normal limits  CBC  I-STAT TROPONIN, ED    EKG EKG Interpretation  Date/Time:  Friday December 19 2017 05:49:32 EST Ventricular Rate:  60 PR Interval:    QRS Duration: 99 QT Interval:  427 QTC Calculation: 427 R Axis:   19 Text Interpretation:  Sinus rhythm Abnormal R-wave progression, early transition Baseline wander in lead(s) II III aVR aVL aVF wandering baseline TECHNICALLY DIFFICULT Otherwise no significant change Confirmed by Deno Etienne [id number]) on 12/19/2017 6:08:52 AM   Radiology Dg Chest 2 View  Result Date: 12/19/2017 CLINICAL DATA:  Cough and congestion for 3 days. EXAM: CHEST - 2 VIEW COMPARISON:  October 06, 2017 FINDINGS: The heart size and mediastinal contours are stable. Tracheal deviation from left to right at the superior mediastinum is  unchanged. Both lungs are clear. The visualized skeletal structures are unremarkable. IMPRESSION: No active cardiopulmonary disease. Electronically Signed   By: Abelardo Diesel M.D.   On: 12/19/2017 07:06    Procedures Procedures (including critical care time)  Medications Ordered in ED Medications - No data to display   Initial Impression / Assessment and Plan / ED Course  I have reviewed the triage vital signs and the nursing notes.  Pertinent labs & imaging results that were available during my care of the patient were reviewed by me and considered in my medical decision making (see chart for details).   Patient presents to the emergency department with chest pain. Patient nontoxic appearing, in no apparent distress, vitals without significant abnormality- mild bradycardia intermittently which patient has a hx of. Fairly benign physical exam. DDX: ACS, pulmonary embolism, dissection, pneumothorax, effusion, infiltrate, arrhythmia, anemia, electrolyte derangement, MSK, pleurisy. Evaluation initiated with labs, EKG, and CXR. Patient on cardiac monitor.   Work-up in the ER unremarkable. Labs reviewed, no leukocytosis, anemia, or significant electrolyte abnormality. Creatinine of 1.31 at baseline from most recent on chart review.  CXR without infiltrate, effusion, pneumothorax, or fracture/dislocation.   Patient is low risk wells, ambulatory without dyspnea or desat in the ER, doubt pulmonary embolism. Pain is not a tearing sensation, symmetric pulses, no widening of mediastinum on CXR, doubt dissection.   Given reproducibility of pain with anterior chest wall palpation suspect msk/pleurisy in setting of coughing over past few days, no pneumonia on CXR. However given hx of CAD with episode of lightheadedness/diaphoresis which is resolved will touch base with cardiology team. EKG without significant change from prior initial troponin negative.   08:03: CONSULT: Discussed case with cardiologist Dr.  Johnsie Cancel who has reviewed EKG, reccomends if delta troponin negative- discharge home with follow up with Dr. Martinique in office.   Delta troponin negative. Patient feeling improved following analgesics in the ER, will discharge on muscle relaxant, NSAIDs avoided secondary to plavix. I discussed results, treatment plan, need for PCP/cardiology follow-up, and return precautions with the patient. Provided opportunity for questions, patient confirmed understanding and is in agreement with plan.   Findings and plan of care discussed with supervising physician Dr. Tyrone Nine who is in agreement.   Final Clinical Impressions(s) / ED Diagnoses   Final diagnoses:  Chest pain, unspecified type    ED Discharge Orders  Ordered    methocarbamol (ROBAXIN) 500 MG tablet  Every 8 hours PRN     12/19/17 0942           Amaryllis Dyke, PA-C 12/19/17 [place], Decker, DO 12/19/17 2305

## 2017-12-19 NOTE — ED Triage Notes (Signed)
Pt arrives to ED from home with complaints of chest pain since this morning while driving to work. EMS reports pt was given 81mg  asa and 1 nitro en route, pain reduced to 6/10. Pt has hx of most recent stent placed in 2016 at [facility]. Pt placed in position of comfort with bed locked and lowered, call bell in reach.

## 2017-12-19 NOTE — ED Notes (Signed)
Patient verbalizes understanding of discharge instructions. Opportunity for questioning and answers were provided. Armband removed by staff, pt discharged from ED.  

## 2017-12-19 NOTE — Discharge Instructions (Signed)
You were seen in the emergency department today for chest pain. Your work-up in the emergency department has been overall reassuring. Your labs have been fairly normal and or similar to previous blood work you have had done. Your EKG and the enzyme we use to check your heart did not show an acute heart attack at this time. Your chest x-ray was normal.   At this time we suspect your pain may be due to irritation of the lung lining (pleurisy) and/or irritation of the chest wall muscles- please see the attached handouts for further information. WE are sending you home with a prescription for robaxin to help with this.   - Robaxin is the muscle relaxer I have prescribed, this is meant to help with muscle tightness. Be aware that this medication may make you drowsy therefore the first time you take this it should be at a time you are in an environment where you can rest. Do not drive or operate heavy machinery when taking this medication. Do not drink alcohol or take other sedating medications with this medicine such as narcotics or benzodiazepines. You may take 1-2 tablets every 8 hours for pain.   You make take Tylenol per over the counter dosing with these medications.   We have prescribed you new medication(s) today. Discuss the medications prescribed today with your pharmacist as they can have adverse effects and interactions with your other medicines including over the counter and prescribed medications. Seek medical evaluation if you start to experience new or abnormal symptoms after taking one of these medicines, seek care immediately if you start to experience difficulty breathing, feeling of your throat closing, facial swelling, or rash as these could be indications of a more serious allergic reaction  We spoke with your cardiology team while you were in the ER and they would like you to follow up in their office within the next week. Please also follow up with your primary care doctor within 1 week.     Return to the ER immediately should you experience any new or worsening symptoms including but not limited to return of pain, worsened pain, vomiting, shortness of breath, dizziness, lightheadedness, passing out, or any other concerns that you may have.

## 2017-12-19 NOTE — ED Notes (Signed)
ED Provider at bedside. 

## 2017-12-19 NOTE — ED Notes (Signed)
Patient transported to X-ray 

## 2017-12-19 NOTE — ED Triage Notes (Addendum)
BIB EMS from home. Pt began feeling central CP while getting ready for work, becoming worse + SOB as he was attempted to leave for work, so he stayed home & called EMS. Hx of heart cath, last one at [facility] in 2016, though seen here in Sept and placed on plavix. Pt also has had non-productive cough for several days. Pain worse with deep inspiration and palpation. Given 324mg  ASA, 1 nitro by EMS. Pain initially 9/10, now 6/10 after interventions. Pt ambulatory and in NAD upon arrival in ED.

## 2018-01-05 ENCOUNTER — Other Ambulatory Visit: Payer: Self-pay | Admitting: Cardiology

## 2018-01-05 NOTE — Telephone Encounter (Signed)
Rx request sent to pharmacy.  

## 2018-01-06 NOTE — Telephone Encounter (Signed)
Rx has been sent to the pharmacy electronically. ° °

## 2018-04-07 DIAGNOSIS — M25561 Pain in right knee: Secondary | ICD-10-CM | POA: Diagnosis not present

## 2018-04-07 DIAGNOSIS — M25562 Pain in left knee: Secondary | ICD-10-CM | POA: Diagnosis not present

## 2018-04-07 DIAGNOSIS — Z87820 Personal history of traumatic brain injury: Secondary | ICD-10-CM | POA: Diagnosis not present

## 2018-04-07 DIAGNOSIS — R413 Other amnesia: Secondary | ICD-10-CM | POA: Diagnosis not present

## 2018-04-13 DIAGNOSIS — M170 Bilateral primary osteoarthritis of knee: Secondary | ICD-10-CM | POA: Diagnosis not present

## 2018-04-13 DIAGNOSIS — M1712 Unilateral primary osteoarthritis, left knee: Secondary | ICD-10-CM | POA: Diagnosis not present

## 2018-04-28 ENCOUNTER — Telehealth: Payer: Self-pay | Admitting: Cardiology

## 2018-04-28 NOTE — Telephone Encounter (Signed)
Patient's wife called wanting to know he Jen should limit his time at work. He works for a [employer] as a Chartered certified accountant.

## 2018-04-28 NOTE — Telephone Encounter (Signed)
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  Left call back number if issues.

## 2018-05-20 DIAGNOSIS — R413 Other amnesia: Secondary | ICD-10-CM | POA: Diagnosis not present

## 2018-05-20 DIAGNOSIS — R55 Syncope and collapse: Secondary | ICD-10-CM | POA: Diagnosis not present

## 2018-06-15 DIAGNOSIS — R55 Syncope and collapse: Secondary | ICD-10-CM | POA: Diagnosis not present

## 2018-06-15 DIAGNOSIS — R413 Other amnesia: Secondary | ICD-10-CM | POA: Diagnosis not present

## 2018-06-18 DIAGNOSIS — S0990XA Unspecified injury of head, initial encounter: Secondary | ICD-10-CM | POA: Diagnosis not present

## 2018-06-18 DIAGNOSIS — R413 Other amnesia: Secondary | ICD-10-CM | POA: Diagnosis not present

## 2018-06-18 DIAGNOSIS — R55 Syncope and collapse: Secondary | ICD-10-CM | POA: Diagnosis not present

## 2018-07-04 ENCOUNTER — Other Ambulatory Visit: Payer: Self-pay | Admitting: Cardiology

## 2018-08-09 NOTE — Progress Notes (Signed)
Cardiology Office Note   Date:  08/11/2018   ID:  Izaias, Krupka 1965-03-29, MRN [id number]  PCP:  Patient, No Pcp Per  Cardiologist:   Sharifa Bucholz Martinique, MD   Chief Complaint  Patient presents with  . Coronary Artery Disease      History of Present Illness: ADNAN VANVOORHIS is a 53 y.o. male who is seen for post hospital follow up.  He has a known history of CAD. Had cardiac cath here in 2010 and 2014 showing extensive small vessel CAD with severe disease in several small terminal branches. Not a candidate for intervention. Was treated with Imdur in the past but this was not continued due to side effects ? HA. Not a candidate for beta blocker due to chronic bradycardia. Was evaluated at [facility] in [facility] 2015. Echo was normal. Stress Myoview was done and he was able to walk 8 minutes but developed 8/10 chest pain. HR only up to 121. Small inferolateral perfusion defect noted. Cardiac cath done. Stable findings.  He was started on Ranexa but noted no change in symptoms. When seen in follow up we started him on amlodipine and Ranexa was stopped due to lack of benefit.   He was just admitted  8/26-8/27/19 for evaluation of vertigo.   Echo was unremarkable. He ruled out with negative troponin levels. Symptoms improved.   On follow up today he reports he is doing well. Works as a Chartered certified accountant for [employer]. Vertigo symptoms have improved.  He is very active.   Past Medical History:  Diagnosis Date  . Aortic stenosis    Mild  . Bradycardia   . CKD (chronic kidney disease), stage II   . Coronary artery disease    a. multivessel, treated medically.  . H. pylori infection   . Hypercholesterolemia   . Hypertension     Past Surgical History:  Procedure Laterality Date  . CARDIAC CATHETERIZATION     Ejection Fraction 65%  . MASS EXCISION N/A 04/05/2016   Procedure: EXCISION OF LOWER LIP BENIGN LESION LESS THAN 1 CM AND LAYERED CLOSURE LIP 1.5 CM. ;  Surgeon: Irene Limbo, MD;   Location: [place];  Service: Plastics;  Laterality: N/A;  . WISDOM TOOTH EXTRACTION       Current Outpatient Medications  Medication Sig Dispense Refill  . amLODipine (NORVASC) 5 MG tablet TAKE 1 TABLET DAILY (Patient taking differently: 10 mg. ) 90 tablet 2  . atorvastatin (LIPITOR) 80 MG tablet TAKE 1 TABLET EVERY MORNING 90 tablet 3  . clopidogrel (PLAVIX) 75 MG tablet Take 1 tablet (75 mg total) by mouth daily. 90 tablet 2  . Multiple Vitamin (MULTIVITAMIN WITH MINERALS) TABS tablet Take 1 tablet by mouth daily.    Marland Kitchen olmesartan-hydrochlorothiazide (BENICAR HCT) 20-12.5 MG tablet Take 1 tablet by mouth daily. 90 tablet 2  . methocarbamol (ROBAXIN) 500 MG tablet Take 1 tablet (500 mg total) by mouth every 8 (eight) hours as needed. (Patient not taking: Reported on 08/11/2018) 30 tablet 0   No current facility-administered medications for this visit.     Allergies:   Patient has no known allergies.    Social History:  The patient  reports that he has never smoked. He has never used smokeless tobacco. He reports that he does not drink alcohol or use drugs.   Family History:  The patient's family history includes Heart disease in his father; Leukemia in his mother; Obstructive Sleep Apnea in his brother.  ROS:  Please see the history of present illness.   Otherwise, review of systems are positive for none.   All other systems are reviewed and negative.    PHYSICAL EXAM: VS:  BP 124/78   Pulse 60   Temp 98.4 F (36.9 C)   Ht 5\' 5"  (1.651 m)   Wt 191 lb 6.4 oz (86.8 kg)   SpO2 99%   BMI 31.85 kg/m  , BMI Body mass index is 31.85 kg/m. GENERAL:  Well appearing BM in NAD HEENT:  PERRL, EOMI, sclera are clear. Oropharynx is clear. NECK:  No jugular venous distention, carotid upstroke brisk and symmetric, no bruits, no thyromegaly or adenopathy LUNGS:  Clear to auscultation bilaterally CHEST:  Unremarkable HEART:  RRR,  PMI not displaced or sustained,S1 and S2  within normal limits, no S3, no S4: no clicks, no rubs, no murmurs ABD:  Soft, nontender. BS +, no masses or bruits. No hepatomegaly, no splenomegaly EXT:  2 + pulses throughout, no edema, no cyanosis no clubbing SKIN:  Warm and dry.  No rashes NEURO:  Alert and oriented x 3. Cranial nerves II through XII intact. PSYCH:  Cognitively intact  EKG:  EKG is  ordered today. Sinus brady rate 58. Mild criteria for LVH. I have personally reviewed and interpreted this study.   Recent Labs: 10/06/2017: TSH 1.756 12/19/2017: BUN 13; Creatinine, Ser 1.31; Hemoglobin 15.1; Platelets 255; Potassium 3.6; Sodium 139    Lipid Panel    Component Value Date/Time   CHOL 120 07/12/2016 1602   TRIG 70 07/12/2016 1602   HDL 40 07/12/2016 1602   CHOLHDL 3.0 07/12/2016 1602   CHOLHDL 3 11/23/2010 1015   VLDL 16.6 11/23/2010 1015   LDLCALC 66 07/12/2016 1602      Wt Readings from Last 3 Encounters:  08/11/18 191 lb 6.4 oz (86.8 kg)  12/19/17 185 lb (83.9 kg)  10/09/17 189 lb (85.7 kg)      Other studies Reviewed:  Cardiac cath films reviewed from [place] 2015 Review of the above records demonstrates:   I have personally reviewed images.  There is diffuse 50% disease in the proximal and mid LAD. The distal LAD is occluded at the apex.  There is a large ramus intermediate branch with a critical ostial stenosis 99%. There is a 90% stenosis at the bifurcation of the ramus into 2 moderate sized branches. The LCx is small and has 80% ostial stenosis with occlusion of a small distal branch. The RCA has 50% mid vessel disease. The PDA is occluded and there is a 90% stenosis at the origin of the PLOM.  Echo 10/06/17: Study Conclusions  - Left ventricle: The cavity size was normal. Wall thickness was   normal. Systolic function was normal. The estimated ejection   fraction was in the range of 60% to 65%. Wall motion was normal;   there were no regional wall motion abnormalities. Doppler   parameters are  consistent with abnormal left ventricular   relaxation (grade 1 diastolic dysfunction). The E/e&' ratio is   between 8-15, suggesting indeterminate LV filling pressure. - Aortic valve: Trileaflet. Sclerosis without stenosis. There was   no regurgitation. - Mitral valve: Mildly thickened leaflets . There was trivial   regurgitation. - Left atrium: The atrium was normal in size. - Right atrium: The atrium was mildly dilated. - Inferior vena cava: The vessel was normal in size. The   respirophasic diameter changes were in the normal range (>= 50%),   consistent with  normal central venous pressure.  Impressions:  - Compared to a prior study in 07/2016, the LVEF is stable at   60-65%. There is no significant aortic stenosis.   ASSESSMENT AND PLAN:  1.  CAD with stable class 1-2 angina. Patient with known small vessel CAD.  Much of his disease involves small distal branches that are not amenable to revascularization. The ramus intermediate has severe ostial and mid vessel disease but PCI would be difficult with ostial location and potential plaque shift. The mid vessel lesion is at a bifurcation. His angiogram in 2015 was not significantly changed from 2010. Continue medical therapy with amlodipine.  He is not a candidate for beta blocker due to bradycardia. Imdur in the past was not tolerated. Ranexa has not resulted in any improvement in symptoms. He is currently with class 1-2 symptoms.   2. HTN. Well controlled. Unclear if he is taking 5 or 10 mg of amlodipine. He will check his bottle to be sure.   3. Hypercholesterolemia.  On Lipitor. No LFTs or lipid panel for 2 years. Will order CMET and lipid panel.   4. Vertigo. Suspect BPV. Resolved.    Current medicines are reviewed at length with the patient today.  The patient does not have concerns regarding medicines.  The following changes have been made:  See above  Labs/ tests ordered today include:   Orders Placed This Encounter   Procedures  . Comprehensive metabolic panel  . Lipid panel  . EKG 12-Lead   Follow up in one year.   Signed, Kang Ishida Martinique, MD  08/11/2018 5:08 PM    [facility] [address], [place], [place], [address] Phone [phone], Fax [phone]

## 2018-08-10 ENCOUNTER — Telehealth: Payer: Self-pay | Admitting: Cardiology

## 2018-08-10 NOTE — Telephone Encounter (Signed)

## 2018-08-11 ENCOUNTER — Encounter: Payer: Self-pay | Admitting: Cardiology

## 2018-08-11 ENCOUNTER — Ambulatory Visit (INDEPENDENT_AMBULATORY_CARE_PROVIDER_SITE_OTHER): Payer: BC Managed Care – PPO | Admitting: Cardiology

## 2018-08-11 ENCOUNTER — Other Ambulatory Visit: Payer: Self-pay

## 2018-08-11 VITALS — BP 124/78 | HR 60 | Temp 98.4°F | Ht 65.0 in | Wt 191.4 lb

## 2018-08-11 DIAGNOSIS — I25118 Atherosclerotic heart disease of native coronary artery with other forms of angina pectoris: Secondary | ICD-10-CM | POA: Diagnosis not present

## 2018-08-11 DIAGNOSIS — E7800 Pure hypercholesterolemia, unspecified: Secondary | ICD-10-CM | POA: Diagnosis not present

## 2018-08-11 NOTE — Patient Instructions (Signed)
Medication Instructions:  The current medical regimen is effective;  continue present plan and medications.  If you need a refill on your cardiac medications before your next appointment, please call your pharmacy.   Lab work: CMET, LIPID  Attached are the lab orders that are needed before your upcoming appointment, please come in anytime to have your labs drawn.   They are fasting labs, so nothing to eat or drink after midnight.  Lab hours: 8:00-4:00 lunch hours 12:45-1:45  If you have labs (blood work) drawn today and your tests are completely normal, you will receive your results only by: Marland Kitchen MyChart Message (if you have MyChart) OR . A paper copy in the mail If you have any lab test that is abnormal or we need to change your treatment, we will call you to review the results.  Follow-Up: At [facility], you and your health needs are our priority.  As part of our continuing mission to provide you with exceptional heart care, we have created designated Provider Care Teams.  These Care Teams include your primary Cardiologist (physician) and Advanced Practice Providers (APPs -  Physician Assistants and Nurse Practitioners) who all work together to provide you with the care you need, when you need it. You will need a follow up appointment in 12 months.  Please call our office 2 months in advance to schedule this appointment.  You may see Peter Martinique, MD or one of the following Advanced Practice Providers on your designated Care Team: [place], [place] . Fabian Sharp, PA-C

## 2018-10-03 ENCOUNTER — Other Ambulatory Visit: Payer: Self-pay | Admitting: Cardiology

## 2018-10-22 DIAGNOSIS — I25118 Atherosclerotic heart disease of native coronary artery with other forms of angina pectoris: Secondary | ICD-10-CM | POA: Diagnosis not present

## 2018-10-22 LAB — COMPREHENSIVE METABOLIC PANEL
ALT: 29 IU/L (ref 0–44)
AST: 28 IU/L (ref 0–40)
Albumin/Globulin Ratio: 1.9 (ref 1.2–2.2)
Albumin: 4.4 g/dL (ref 3.8–4.9)
Alkaline Phosphatase: 130 IU/L — ABNORMAL HIGH (ref 39–117)
BUN/Creatinine Ratio: 16 (ref 9–20)
BUN: 18 mg/dL (ref 6–24)
Bilirubin Total: 0.3 mg/dL (ref 0.0–1.2)
CO2: 26 mmol/L (ref 20–29)
Calcium: 9.8 mg/dL (ref 8.7–10.2)
Chloride: 102 mmol/L (ref 96–106)
Creatinine, Ser: 1.13 mg/dL (ref 0.76–1.27)
GFR calc Af Amer: 85 mL/min/{1.73_m2} (ref >59)
GFR calc non Af Amer: 73 mL/min/{1.73_m2} (ref >59)
Globulin, Total: 2.3 g/dL (ref 1.5–4.5)
Glucose: 99 mg/dL (ref 65–99)
Potassium: 4.2 mmol/L (ref 3.5–5.2)
Sodium: 143 mmol/L (ref 134–144)
Total Protein: 6.7 g/dL (ref 6.0–8.5)

## 2018-10-22 LAB — LIPID PANEL
Chol/HDL Ratio: 3.6 ratio (ref 0.0–5.0)
Cholesterol, Total: 126 mg/dL (ref 100–199)
HDL: 35 mg/dL — ABNORMAL LOW (ref >39)
LDL Chol Calc (NIH): 73 mg/dL (ref 0–99)
Triglycerides: 97 mg/dL (ref 0–149)
VLDL Cholesterol Cal: 18 mg/dL (ref 5–40)

## 2018-12-20 ENCOUNTER — Encounter (HOSPITAL_COMMUNITY): Payer: Self-pay

## 2018-12-20 ENCOUNTER — Ambulatory Visit (INDEPENDENT_AMBULATORY_CARE_PROVIDER_SITE_OTHER)
Admission: EM | Admit: 2018-12-20 | Discharge: 2018-12-20 | Disposition: A | Discharge status: Home / Self Care | Payer: BC Managed Care – PPO | Source: Home / Self Care

## 2018-12-20 ENCOUNTER — Other Ambulatory Visit: Payer: Self-pay

## 2018-12-20 ENCOUNTER — Emergency Department (HOSPITAL_COMMUNITY)
Admission: EM | Admit: 2018-12-20 | Discharge: 2018-12-21 | Disposition: A | Discharge status: Home / Self Care | Payer: BC Managed Care – PPO | Attending: Emergency Medicine | Admitting: Emergency Medicine

## 2018-12-20 DIAGNOSIS — I9589 Other hypotension: Secondary | ICD-10-CM | POA: Diagnosis not present

## 2018-12-20 DIAGNOSIS — E7800 Pure hypercholesterolemia, unspecified: Secondary | ICD-10-CM | POA: Insufficient documentation

## 2018-12-20 DIAGNOSIS — J3489 Other specified disorders of nose and nasal sinuses: Secondary | ICD-10-CM

## 2018-12-20 DIAGNOSIS — J0100 Acute maxillary sinusitis, unspecified: Secondary | ICD-10-CM

## 2018-12-20 DIAGNOSIS — Z79899 Other long term (current) drug therapy: Secondary | ICD-10-CM | POA: Insufficient documentation

## 2018-12-20 DIAGNOSIS — Z7902 Long term (current) use of antithrombotics/antiplatelets: Secondary | ICD-10-CM | POA: Insufficient documentation

## 2018-12-20 DIAGNOSIS — I129 Hypertensive chronic kidney disease with stage 1 through stage 4 chronic kidney disease, or unspecified chronic kidney disease: Secondary | ICD-10-CM | POA: Insufficient documentation

## 2018-12-20 DIAGNOSIS — U071 COVID-19: Secondary | ICD-10-CM | POA: Diagnosis not present

## 2018-12-20 DIAGNOSIS — Z7901 Long term (current) use of anticoagulants: Secondary | ICD-10-CM | POA: Insufficient documentation

## 2018-12-20 DIAGNOSIS — R110 Nausea: Secondary | ICD-10-CM | POA: Diagnosis not present

## 2018-12-20 DIAGNOSIS — R55 Syncope and collapse: Secondary | ICD-10-CM | POA: Insufficient documentation

## 2018-12-20 DIAGNOSIS — R5381 Other malaise: Secondary | ICD-10-CM | POA: Insufficient documentation

## 2018-12-20 DIAGNOSIS — R52 Pain, unspecified: Secondary | ICD-10-CM | POA: Diagnosis not present

## 2018-12-20 DIAGNOSIS — I350 Nonrheumatic aortic (valve) stenosis: Secondary | ICD-10-CM | POA: Insufficient documentation

## 2018-12-20 DIAGNOSIS — R1084 Generalized abdominal pain: Secondary | ICD-10-CM | POA: Diagnosis not present

## 2018-12-20 DIAGNOSIS — I2510 Atherosclerotic heart disease of native coronary artery without angina pectoris: Secondary | ICD-10-CM | POA: Insufficient documentation

## 2018-12-20 DIAGNOSIS — E861 Hypovolemia: Secondary | ICD-10-CM | POA: Insufficient documentation

## 2018-12-20 DIAGNOSIS — R5383 Other fatigue: Secondary | ICD-10-CM | POA: Insufficient documentation

## 2018-12-20 DIAGNOSIS — R0981 Nasal congestion: Secondary | ICD-10-CM

## 2018-12-20 DIAGNOSIS — N182 Chronic kidney disease, stage 2 (mild): Secondary | ICD-10-CM | POA: Insufficient documentation

## 2018-12-20 DIAGNOSIS — I959 Hypotension, unspecified: Secondary | ICD-10-CM | POA: Diagnosis not present

## 2018-12-20 LAB — CBC
HCT: 42.8 % (ref 39.0–52.0)
Hemoglobin: 14.2 g/dL (ref 13.0–17.0)
MCH: 28.4 pg (ref 26.0–34.0)
MCHC: 33.2 g/dL (ref 30.0–36.0)
MCV: 85.6 fL (ref 80.0–100.0)
Platelets: 193 10*3/uL (ref 150–400)
RBC: 5 MIL/uL (ref 4.22–5.81)
RDW: 13.7 % (ref 11.5–15.5)
WBC: 4.3 10*3/uL (ref 4.0–10.5)
nRBC: 0 % (ref 0.0–0.2)

## 2018-12-20 LAB — URINALYSIS, ROUTINE W REFLEX MICROSCOPIC
Bilirubin Urine: NEGATIVE
Glucose, UA: NEGATIVE mg/dL
Hgb urine dipstick: NEGATIVE
Ketones, ur: NEGATIVE mg/dL
Leukocytes,Ua: NEGATIVE
Nitrite: NEGATIVE
Protein, ur: NEGATIVE mg/dL
Specific Gravity, Urine: 1.03 (ref 1.005–1.030)
pH: 6 (ref 5.0–8.0)

## 2018-12-20 LAB — COMPREHENSIVE METABOLIC PANEL
ALT: 28 U/L (ref 0–44)
AST: 35 U/L (ref 15–41)
Albumin: 3.6 g/dL (ref 3.5–5.0)
Alkaline Phosphatase: 94 U/L (ref 38–126)
Anion gap: 10 (ref 5–15)
BUN: 14 mg/dL (ref 6–20)
CO2: 26 mmol/L (ref 22–32)
Calcium: 8.9 mg/dL (ref 8.9–10.3)
Chloride: 103 mmol/L (ref 98–111)
Creatinine, Ser: 1.46 mg/dL — ABNORMAL HIGH (ref 0.61–1.24)
GFR calc Af Amer: >60 mL/min (ref >60)
GFR calc non Af Amer: 54 mL/min — ABNORMAL LOW (ref >60)
Glucose, Bld: 118 mg/dL — ABNORMAL HIGH (ref 70–99)
Potassium: 3.2 mmol/L — ABNORMAL LOW (ref 3.5–5.1)
Sodium: 139 mmol/L (ref 135–145)
Total Bilirubin: 0.6 mg/dL (ref 0.3–1.2)
Total Protein: 6.7 g/dL (ref 6.5–8.1)

## 2018-12-20 LAB — CBG MONITORING, ED: Glucose-Capillary: 116 mg/dL — ABNORMAL HIGH (ref 70–99)

## 2018-12-20 LAB — LIPASE, BLOOD: Lipase: 42 U/L (ref 11–51)

## 2018-12-20 MED ORDER — SODIUM CHLORIDE 0.9 % IV BOLUS (SEPSIS)
1000.0000 mL | Freq: Once | INTRAVENOUS | Status: AC
  Administered 2018-12-20: 1000 mL via INTRAVENOUS

## 2018-12-20 MED ORDER — SODIUM CHLORIDE 0.9 % IV SOLN
1000.0000 mL | INTRAVENOUS | Status: DC
  Administered 2018-12-20: 1000 mL via INTRAVENOUS

## 2018-12-20 MED ORDER — BENZONATATE 100 MG PO CAPS: 100.0000 mg | ORAL_CAPSULE | Freq: Three times a day (TID) | ORAL | 0 refills | Status: DC | PRN

## 2018-12-20 MED ORDER — SODIUM CHLORIDE 0.9% FLUSH: 3.0000 mL | Freq: Once | INTRAVENOUS | Status: DC

## 2018-12-20 MED ORDER — ACETAMINOPHEN 325 MG PO TABS
650.0000 mg | ORAL_TABLET | Freq: Once | ORAL | Status: AC
  Administered 2018-12-20: 650 mg via ORAL
  Filled 2018-12-20: qty 2

## 2018-12-20 MED ORDER — PROMETHAZINE-DM 6.25-15 MG/5ML PO SYRP: 5.0000 mL | ORAL_SOLUTION | Freq: Every evening | ORAL | 0 refills | Status: DC | PRN

## 2018-12-20 MED ORDER — DOXYCYCLINE HYCLATE 100 MG PO CAPS: 100.0000 mg | ORAL_CAPSULE | Freq: Two times a day (BID) | ORAL | 0 refills | Status: DC

## 2018-12-20 NOTE — ED Provider Notes (Signed)
MRN: [id number] DOB: 04/14/1965  Subjective:   Nathan Barrett is a 53 y.o. male presenting for 2-day history of acute onset worsening moderate to severe sinus pain, sinus headache, stuffy nose.  Symptoms are worse on the left side, has had associated fever, chills and general malaise. Has tried APAP with temporary relief. Denies any known COVID 19 contacts. Had a tooth extraction, was given amoxicillin for this, finished 2 week course ~1 week ago.   No current facility-administered medications for this encounter.   Current Outpatient Medications:  .  amLODipine (NORVASC) 5 MG tablet, TAKE 1 TABLET DAILY, Disp: 90 tablet, Rfl: 2 .  atorvastatin (LIPITOR) 80 MG tablet, TAKE 1 TABLET EVERY MORNING, Disp: 90 tablet, Rfl: 3 .  clopidogrel (PLAVIX) 75 MG tablet, TAKE 1 TABLET DAILY, Disp: 90 tablet, Rfl: 2 .  methocarbamol (ROBAXIN) 500 MG tablet, Take 1 tablet (500 mg total) by mouth every 8 (eight) hours as needed. (Patient not taking: Reported on 08/11/2018), Disp: 30 tablet, Rfl: 0 .  Multiple Vitamin (MULTIVITAMIN WITH MINERALS) TABS tablet, Take 1 tablet by mouth daily., Disp: , Rfl:  .  olmesartan-hydrochlorothiazide (BENICAR HCT) 20-12.5 MG tablet, TAKE 1 TABLET DAILY, Disp: 90 tablet, Rfl: 2    No Known Allergies   Past Medical History:  Diagnosis Date  . Aortic stenosis    Mild  . Bradycardia   . CKD (chronic kidney disease), stage II   . Coronary artery disease    a. multivessel, treated medically.  . H. pylori infection   . Hypercholesterolemia   . Hypertension      Past Surgical History:  Procedure Laterality Date  . CARDIAC CATHETERIZATION     Ejection Fraction 65%  . MASS EXCISION N/A 04/05/2016   Procedure: EXCISION OF LOWER LIP BENIGN LESION LESS THAN 1 CM AND LAYERED CLOSURE LIP 1.5 CM. ;  Surgeon: Irene Limbo, MD;  Location: [place];  Service: Plastics;  Laterality: N/A;  . WISDOM TOOTH EXTRACTION      Review of Systems  Constitutional:  Positive for chills, fever and malaise/fatigue.  HENT: Positive for congestion and sinus pain. Negative for ear pain and sore throat.   Eyes: Negative for discharge and redness.  Respiratory: Positive for cough. Negative for hemoptysis, shortness of breath and wheezing.   Cardiovascular: Negative for chest pain.  Gastrointestinal: Negative for abdominal pain, diarrhea, nausea and vomiting.  Genitourinary: Negative for dysuria, flank pain and hematuria.  Musculoskeletal: Negative for myalgias.  Skin: Negative for rash.  Neurological: Positive for headaches. Negative for dizziness and weakness.  Psychiatric/Behavioral: Negative for depression and substance abuse.    Objective:   Vitals: BP 130/74 (BP Location: Right Arm)   Pulse 71   Temp 98.9 F (37.2 C) (Oral)   Resp 17   SpO2 96%   BP Readings from Last 3 Encounters:  12/20/18 130/74  08/11/18 124/78  12/19/17 112/78   Physical Exam Constitutional:      General: He is not in acute distress.    Appearance: Normal appearance. He is well-developed and normal weight. He is not ill-appearing, toxic-appearing or diaphoretic.  HENT:     Head: Normocephalic and atraumatic.     Right Ear: Tympanic membrane, ear canal and external ear normal. There is no impacted cerumen.     Left Ear: Tympanic membrane, ear canal and external ear normal. There is no impacted cerumen.     Nose: Mucosal edema and congestion present. No rhinorrhea.     Left Sinus: Maxillary  sinus tenderness and frontal sinus tenderness present.     Mouth/Throat:     Mouth: Mucous membranes are moist.     Pharynx: No oropharyngeal exudate or posterior oropharyngeal erythema.  Eyes:     General: No scleral icterus.       Right eye: No discharge.        Left eye: No discharge.     Extraocular Movements: Extraocular movements intact.     Conjunctiva/sclera: Conjunctivae normal.     Pupils: Pupils are equal, round, and reactive to light.  Neck:     Musculoskeletal:  Normal range of motion and neck supple. No neck rigidity or muscular tenderness.  Cardiovascular:     Rate and Rhythm: Normal rate and regular rhythm.     Heart sounds: Normal heart sounds. No murmur. No friction rub. No gallop.   Pulmonary:     Effort: Pulmonary effort is normal. No respiratory distress.     Breath sounds: Normal breath sounds. No stridor. No wheezing, rhonchi or rales.  Neurological:     General: No focal deficit present.     Mental Status: He is alert and oriented to person, place, and time.  Psychiatric:        Mood and Affect: Mood normal.        Behavior: Behavior normal.        Thought Content: Thought content normal.      Assessment and Plan :   1. Acute non-recurrent maxillary sinusitis   2. Sinus congestion   3. Sinus pain   4. Malaise and fatigue     Will cover for sinusitis with doxycycline, use supportive care otherwise.  COVID 19 testing is pending.  Counseled patient on potential for adverse effects with medications prescribed/recommended today, ER and return-to-clinic precautions discussed, patient verbalized understanding.    Jaynee Eagles, PA-C 12/20/18 1456

## 2018-12-20 NOTE — Discharge Instructions (Signed)
For sore throat or cough try using a honey-based tea. Use 3 teaspoons of honey with juice squeezed from half lemon. Place shaved pieces of ginger into 1/2-1 cup of water and warm over stove top. Then mix the ingredients and repeat every 4 hours as needed. Please take Tylenol 500mg  every 6 hours. Hydrate very well with at least 2 liters of water. Eat light meals such as soups to replenish electrolytes and soft fruits, veggies. Start an antihistamine like Zyrtec, Allegra or Claritin for postnasal drainage, sinus congestion.  You can take this together with pseudoephedrine (Sudafed) at a dose of 30-60 mg 3 times a day or twice daily as needed for the same kind of congestion.

## 2018-12-20 NOTE — ED Triage Notes (Signed)
Pt presents with sinus pain, headache, facial pressure, and nasal congestion X 2 days.

## 2018-12-20 NOTE — ED Provider Notes (Signed)
[facility] EMERGENCY DEPARTMENT Provider Note  CSN: [id number] Arrival date & time: 12/20/18 2229  Chief Complaint(s) Abdominal Pain and Loss of Consciousness  HPI Nathan Barrett is a 53 y.o. male with a history of CAD who presents to the emergency department after syncopal episode at home.  Patient reports that he has been having difficulty using the restroom due to decreased oral intake over the past week or so.  This is related to sinus infection for which he saw urgent care earlier today and was prescribed doxycycline.  Patient was tested for COVID-19, which is currently pending.  Patient reported needing to have bowel movement earlier this evening.  When he got up to go to the restroom, he felt lightheaded and called for his wife who helped him up.  On his way to the bathroom, the patient passed out.  EMS was called and noted patient had soft blood pressures with systolics in the 0000000.  He was given IV fluids.  During this episode the patient denied any chest pain or shortness of breath.  He has been having intermittent epigastric abdominal discomfort which she attributes to his lack of oral intake and bowel movements.  Currently has no abdominal pain.  No nausea, vomiting, diarrhea.    HPI  Past Medical History Past Medical History:  Diagnosis Date  . Aortic stenosis    Mild  . Bradycardia   . CKD (chronic kidney disease), stage II   . Coronary artery disease    a. multivessel, treated medically.  . H. pylori infection   . Hypercholesterolemia   . Hypertension    Patient Active Problem List   Diagnosis Date Noted  . Syncope 04/09/2012  . Coronary artery disease   . Hypercholesterolemia   . Hypertension    Home Medication(s) Prior to Admission medications   Medication Sig Start Date End Date Taking? Authorizing Provider  amLODipine (NORVASC) 5 MG tablet TAKE 1 TABLET DAILY 10/06/18   Martinique, Peter M, MD  atorvastatin (LIPITOR) 80 MG tablet TAKE 1 TABLET  EVERY MORNING 07/07/18   Martinique, Peter M, MD  benzonatate (TESSALON) 100 MG capsule Take 1-2 capsules (100-200 mg total) by mouth 3 (three) times daily as needed. 12/20/18   Jaynee Eagles, PA-C  clopidogrel (PLAVIX) 75 MG tablet TAKE 1 TABLET DAILY 10/06/18   Martinique, Peter M, MD  doxycycline (VIBRAMYCIN) 100 MG capsule Take 1 capsule (100 mg total) by mouth 2 (two) times daily. 12/20/18   Jaynee Eagles, PA-C  methocarbamol (ROBAXIN) 500 MG tablet Take 1 tablet (500 mg total) by mouth every 8 (eight) hours as needed. Patient not taking: Reported on 08/11/2018 12/19/17   Petrucelli, Aldona Bar R, PA-C  Multiple Vitamin (MULTIVITAMIN WITH MINERALS) TABS tablet Take 1 tablet by mouth daily.    [provider]  olmesartan-hydrochlorothiazide [facility] HCT) 20-12.5 MG tablet TAKE 1 TABLET DAILY 10/06/18   Martinique, Peter M, MD  promethazine-dextromethorphan (PROMETHAZINE-DM) 6.25-15 MG/5ML syrup Take 5 mLs by mouth at bedtime as needed for cough. 12/20/18   Jaynee Eagles, PA-C  Past Surgical History Past Surgical History:  Procedure Laterality Date  . CARDIAC CATHETERIZATION     Ejection Fraction 65%  . MASS EXCISION N/A 04/05/2016   Procedure: EXCISION OF LOWER LIP BENIGN LESION LESS THAN 1 CM AND LAYERED CLOSURE LIP 1.5 CM. ;  Surgeon: Irene Limbo, MD;  Location: [place];  Service: Plastics;  Laterality: N/A;  . WISDOM TOOTH EXTRACTION     Family History Family History  Problem Relation Age of Onset  . Leukemia Mother   . Heart disease Father        CABG  . Obstructive Sleep Apnea Brother     Social History Social History   Tobacco Use  . Smoking status: Never Smoker  . Smokeless tobacco: Never Used  Substance Use Topics  . Alcohol use: No    Alcohol/week: 0.0 standard drinks  . Drug use: No   Allergies Patient has no known  allergies.  Review of Systems Review of Systems All other systems are reviewed and are negative for acute change except as noted in the HPI  Physical Exam Vital Signs  I have reviewed the triage vital signs BP 128/88   Pulse (!) 56   Temp 99 F (37.2 C) (Oral)   Resp (!) 22   SpO2 98%   Physical Exam Vitals signs reviewed.  Constitutional:      General: He is not in acute distress.    Appearance: He is well-developed. He is not diaphoretic.  HENT:     Head: Normocephalic and atraumatic.     Nose: Mucosal edema present.     Right Sinus: Maxillary sinus tenderness and frontal sinus tenderness present.     Left Sinus: Maxillary sinus tenderness and frontal sinus tenderness present.  Eyes:     General: No scleral icterus.       Right eye: No discharge.        Left eye: No discharge.     Conjunctiva/sclera: Conjunctivae normal.     Pupils: Pupils are equal, round, and reactive to light.  Neck:     Musculoskeletal: Normal range of motion and neck supple.  Cardiovascular:     Rate and Rhythm: Normal rate and regular rhythm.     Heart sounds: No murmur. No friction rub. No gallop.   Pulmonary:     Effort: Pulmonary effort is normal. No respiratory distress.     Breath sounds: Normal breath sounds. No stridor. No rales.  Abdominal:     General: There is no distension.     Palpations: Abdomen is soft.     Tenderness: There is no abdominal tenderness.  Musculoskeletal:        General: No tenderness.  Skin:    General: Skin is warm and dry.     Findings: No erythema or rash.  Neurological:     Mental Status: He is alert and oriented to person, place, and time.     ED Results and Treatments Labs (all labs ordered are listed, but only abnormal results are displayed) Labs Reviewed  COMPREHENSIVE METABOLIC PANEL - Abnormal; Notable for the following components:      Result Value   Potassium 3.2 (*)    Glucose, Bld 118 (*)    Creatinine, Ser 1.46 (*)    GFR calc non Af  Amer 54 (*)    All other components within normal limits  CBG MONITORING, ED - Abnormal; Notable for the following components:   Glucose-Capillary 116 (*)    All other components  within normal limits  CBC  URINALYSIS, ROUTINE W REFLEX MICROSCOPIC  LIPASE, BLOOD                                                                                                                         EKG  EKG Interpretation  Date/Time:  Sunday December 20 2018 22:37:08 EST Ventricular Rate:  58 PR Interval:    QRS Duration: 109 QT Interval:  399 QTC Calculation: 392 R Axis:   11 Text Interpretation: Sinus rhythm Abnormal R-wave progression, early transition Minimal ST elevation, anterior leads No significant change since last tracing Confirmed by Addison Lank [phone]) on 12/20/2018 11:20:07 PM      Radiology No results found.  Pertinent labs & imaging results that were available during my care of the patient were reviewed by me and considered in my medical decision making (see chart for details).  Medications Ordered in ED Medications  sodium chloride flush (NS) 0.9 % injection 3 mL (has no administration in time range)  sodium chloride 0.9 % bolus 1,000 mL (1,000 mLs Intravenous New Bag/Given 12/20/18 2349)    Followed by  0.9 %  sodium chloride infusion (1,000 mLs Intravenous New Bag/Given 12/20/18 2349)  acetaminophen (TYLENOL) tablet 650 mg (650 mg Oral Given 12/20/18 2348)                                                                                                                                    Procedures .Critical Care Performed by: Fatima Blank, MD Authorized by: Fatima Blank, MD    CRITICAL CARE Performed by: Grayce Sessions Jovonna Nickell Total critical care time: 40 minutes Critical care time was exclusive of separately billable procedures and treating other patients. Critical care was necessary to treat or prevent imminent or life-threatening deterioration. Critical  care was time spent personally by me on the following activities: development of treatment plan with patient and/or surrogate as well as nursing, discussions with consultants, evaluation of patient's response to treatment, examination of patient, obtaining history from patient or surrogate, ordering and performing treatments and interventions, ordering and review of laboratory studies, ordering and review of radiographic studies, pulse oximetry and re-evaluation of patient's condition.    (including critical care time)  Medical Decision Making / ED Course I have reviewed the nursing notes for this encounter and the patient's prior records (if available in EHR or on provided paperwork).  Nathan Barrett was evaluated in Emergency Department on 12/21/2018 for the symptoms described in the history of present illness. He was evaluated in the context of the global COVID-19 pandemic, which necessitated consideration that the patient might be at risk for infection with the SARS-CoV-2 virus that causes COVID-19. Institutional protocols and algorithms that pertain to the evaluation of patients at risk for COVID-19 are in a state of rapid change based on information released by regulatory bodies including the CDC and federal and state organizations. These policies and algorithms were followed during the patient's care in the ED.  EKG without acute ischemic changes or evidence of pericarditis.  No other source mouth, blocks. Doubt cardiac.  Orthostatics positive.  Standing systolic blood pressure down to the 70s. Likely hypovolemia from decreased oral intake. Provided with additional IV fluids.  Labs without anemia.  Did note AKI.  CBG within normal limits.  Covid test returned while in the emergency department and positive.  After IV fluids, patient no longer orthostatic.  He is satting well on room air, not hypoxic or in any respiratory distress.  Patient evaluated, tested and sent home with instructions  for home care and Quarantine. Instructed to seek further care if symptoms worsen.  Is set up with follow up for a virtual visit with PCP in next 24-48 hours.   The patient appears reasonably screened and/or stabilized for discharge and I doubt any other medical condition or other [facility] requiring further screening, evaluation, or treatment in the ED at this time prior to discharge.  The patient is safe for discharge with strict return precautions.      Final Clinical Impression(s) / ED Diagnoses Final diagnoses:  Syncope and collapse  Hypotension due to hypovolemia  COVID-19 virus infection    The patient appears reasonably screened and/or stabilized for discharge and I doubt any other medical condition or other [facility] requiring further screening, evaluation, or treatment in the ED at this time prior to discharge.  Disposition: Discharge  Condition: Good  I have discussed the results, Dx and Tx plan with the patient who expressed understanding and agree(s) with the plan. Discharge instructions discussed at great length. The patient was given strict return precautions who verbalized understanding of the instructions. No further questions at time of discharge.    ED Discharge Orders    None        Follow Up: Primary care provider  Call  As needed      This chart was dictated using voice recognition software.  Despite best efforts to proofread,  errors can occur which can change the documentation meaning.   Fatima Blank, MD 12/21/18 [phone]

## 2018-12-20 NOTE — ED Triage Notes (Signed)
Pt from home with ems with c.o left sided abd pain for 3 days, some nausea but no vomiting. Pt also had a witnessed syncopal episode today, no injuries reported from syncopal episode. Pt initially hypotensive SBP 90. After 261ml SBP 120. Pt arrives a/ox4, pain 1/10.

## 2018-12-21 LAB — SARS CORONAVIRUS 2 (TAT 6-24 HRS): SARS Coronavirus 2: POSITIVE — AB

## 2018-12-22 ENCOUNTER — Telehealth (HOSPITAL_COMMUNITY): Payer: Self-pay | Admitting: Emergency Medicine

## 2018-12-22 ENCOUNTER — Encounter (HOSPITAL_COMMUNITY): Payer: Self-pay

## 2018-12-22 NOTE — Telephone Encounter (Signed)
Positive Covid. Attempted to contact patient and make him aware, left voicemail to return call. Mychart message and comment sent.

## 2018-12-24 ENCOUNTER — Telehealth (HOSPITAL_COMMUNITY): Payer: Self-pay | Admitting: Emergency Medicine

## 2018-12-24 NOTE — Telephone Encounter (Signed)
Attempted to reach patient x2. No answer at this time. Voicemail left.  Sending letter.

## 2018-12-24 NOTE — Telephone Encounter (Signed)
Pt returned my call, had already been informed about his positive test. All questions answered.

## 2019-02-17 ENCOUNTER — Ambulatory Visit (INDEPENDENT_AMBULATORY_CARE_PROVIDER_SITE_OTHER): Payer: BC Managed Care – PPO

## 2019-02-17 ENCOUNTER — Encounter (HOSPITAL_COMMUNITY): Payer: Self-pay

## 2019-02-17 ENCOUNTER — Other Ambulatory Visit: Payer: Self-pay

## 2019-02-17 ENCOUNTER — Ambulatory Visit (HOSPITAL_COMMUNITY)
Admission: EM | Admit: 2019-02-17 | Discharge: 2019-02-17 | Disposition: A | Discharge status: Home / Self Care | Payer: BC Managed Care – PPO | Attending: Family Medicine | Admitting: Family Medicine

## 2019-02-17 DIAGNOSIS — M25512 Pain in left shoulder: Secondary | ICD-10-CM

## 2019-02-17 DIAGNOSIS — M25552 Pain in left hip: Secondary | ICD-10-CM

## 2019-02-17 MED ORDER — NAPROXEN 500 MG PO TABS: 500.0000 mg | ORAL_TABLET | Freq: Two times a day (BID) | ORAL | 0 refills | Status: DC | PRN
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
## 2019-02-17 NOTE — Discharge Instructions (Signed)
Your xray's are normal today which is reassuring.  Strain vs bruising to the area's of pain.  Ice application to help with pain and swelling.  Activity as tolerated.  Naproxen twice a day, with food, as needed for pain.  If no improvement or if worsening in the next few weeks I would recommend following up with sports medicine for further evaluation and management.
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
## 2019-06-30 ENCOUNTER — Other Ambulatory Visit: Payer: Self-pay | Admitting: Cardiology

## 2019-07-20 ENCOUNTER — Telehealth: Payer: Self-pay | Admitting: Cardiology

## 2019-07-20 NOTE — Telephone Encounter (Signed)
*  STAT* If patient is at the pharmacy, call can be transferred to refill team.   1. Which medications need to be refilled? (please list name of each medication and dose if known)  clopidogrel (PLAVIX) 75 MG tablet olmesartan-hydrochlorothiazide (BENICAR HCT) 20-12.5 MG tablet amLODipine (NORVASC) 5 MG tablet atorvastatin (LIPITOR) 80 MG tablet  2. Which pharmacy/location (including street and city if local pharmacy) is medication to be sent to? [id number] - [place], [place] - [address]   3. Do they need a 30 day or 90 day supply? 30 with refills   Pt could not get in to see Dr. Martinique until 09/08/19

## 2019-07-21 MED ORDER — OLMESARTAN MEDOXOMIL-HCTZ 20-12.5 MG PO TABS: 1.0000 | ORAL_TABLET | Freq: Every day | ORAL | 1 refills | Status: DC

## 2019-07-21 MED ORDER — AMLODIPINE BESYLATE 5 MG PO TABS: 5.0000 mg | ORAL_TABLET | Freq: Every day | ORAL | 1 refills | Status: DC

## 2019-07-21 MED ORDER — CLOPIDOGREL BISULFATE 75 MG PO TABS: 75.0000 mg | ORAL_TABLET | Freq: Every day | ORAL | 1 refills | Status: DC

## 2019-07-21 MED ORDER — ATORVASTATIN CALCIUM 80 MG PO TABS: 80.0000 mg | ORAL_TABLET | Freq: Every morning | ORAL | 1 refills | Status: AC

## 2019-09-04 NOTE — Progress Notes (Deleted)
Cardiology Office Note   Date:  09/04/2019   ID:  Nathan, Barrett [birth year], MRN [id number]  PCP:  Patient, No Pcp Per  Cardiologist:   Lavaris Sexson [place], MD   No chief complaint on file.     History of Present Illness: Nathan Barrett is a 54 y.o. male who is seen for post hospital follow up.  He has a known history of CAD. Had cardiac cath here in 2010 and 2014 showing extensive small vessel CAD with severe disease in several small terminal branches. Not a candidate for intervention. Was treated with Imdur in the past but this was not continued due to side effects ? HA. Not a candidate for beta blocker due to chronic bradycardia. Was evaluated at [facility] in [facility] 2015. Echo was normal. Stress Myoview was done and he was able to walk 8 minutes but developed 8/10 chest pain. HR only up to 121. Small inferolateral perfusion defect noted. Cardiac cath done. Stable findings.  He was started on Ranexa but noted no change in symptoms. When seen in follow up we started him on amlodipine and Ranexa was stopped due to lack of benefit.   He was just admitted  8/26-8/27/19 for evaluation of vertigo.   Echo was unremarkable. He ruled out with negative troponin levels. Symptoms improved.   On follow up today he reports he is doing well. 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 Vertigo symptoms have improved.  He is very active.   Past Medical History:  Diagnosis Date  . Aortic stenosis    Mild  . Bradycardia   . CKD (chronic kidney disease), stage II   . Coronary artery disease    a. multivessel, treated medically.  . H. pylori infection   . Hypercholesterolemia   . Hypertension     Past Surgical History:  Procedure Laterality Date  . CARDIAC CATHETERIZATION     Ejection Fraction 65%  . MASS EXCISION N/A 04/05/2016   Procedure: EXCISION OF LOWER LIP BENIGN LESION LESS THAN 1 CM AND LAYERED CLOSURE LIP 1.5 CM. ;  Surgeon: Irene Limbo, MD;  Location: [facility];  Service:  Plastics;  Laterality: N/A;  . WISDOM TOOTH EXTRACTION       Current Outpatient Medications  Medication Sig Dispense Refill  . amLODipine (NORVASC) 5 MG tablet Take 1 tablet (5 mg total) by mouth daily. 90 tablet 1  . atorvastatin (LIPITOR) 80 MG tablet Take 1 tablet (80 mg total) by mouth every morning. 90 tablet 1  . benzonatate (TESSALON) 100 MG capsule Take 1-2 capsules (100-200 mg total) by mouth 3 (three) times daily as needed. 60 capsule 0  . clopidogrel (PLAVIX) 75 MG tablet Take 1 tablet (75 mg total) by mouth daily. 90 tablet 1  . doxycycline (VIBRAMYCIN) 100 MG capsule Take 1 capsule (100 mg total) by mouth 2 (two) times daily. 20 capsule 0  . methocarbamol (ROBAXIN) 500 MG tablet Take 1 tablet (500 mg total) by mouth every 8 (eight) hours as needed. (Patient not taking: Reported on 08/11/2018) 30 tablet 0  . Multiple Vitamin (MULTIVITAMIN WITH MINERALS) TABS tablet Take 1 tablet by mouth daily.    . naproxen (NAPROSYN) 500 MG tablet Take 1 tablet (500 mg total) by mouth 2 (two) times daily as needed. 30 tablet 0  . olmesartan-hydrochlorothiazide (BENICAR HCT) 20-12.5 MG tablet Take 1 tablet by mouth daily. 90 tablet 1  . promethazine-dextromethorphan (PROMETHAZINE-DM) 6.25-15 MG/5ML syrup Take 5 mLs by mouth at  bedtime as needed for cough. 100 mL 0   No current facility-administered medications for this visit.    Allergies:   Patient has no known allergies.    Social History:  The patient  reports that he has never smoked. He has never used smokeless tobacco. He reports that he does not drink alcohol and does not use drugs.   Family History:  The patient's family history includes Heart disease in his father; Leukemia in his mother; Obstructive Sleep Apnea in his brother.    ROS:  Please see the history of present illness.   Otherwise, review of systems are positive for none.   All other systems are reviewed and negative.    PHYSICAL EXAM: VS:  There were no vitals taken  for this visit. , BMI There is no height or weight on file to calculate BMI. GENERAL:  Well appearing BM in NAD HEENT:  PERRL, EOMI, sclera are clear. Oropharynx is clear. NECK:  No jugular venous distention, carotid upstroke brisk and symmetric, no bruits, no thyromegaly or adenopathy LUNGS:  Clear to auscultation bilaterally CHEST:  Unremarkable HEART:  RRR,  PMI not displaced or sustained,S1 and S2 within normal limits, no S3, no S4: no clicks, no rubs, no murmurs ABD:  Soft, nontender. BS +, no masses or bruits. No hepatomegaly, no splenomegaly EXT:  2 + pulses throughout, no edema, no cyanosis no clubbing SKIN:  Warm and dry.  No rashes NEURO:  Alert and oriented x 3. Cranial nerves II through XII intact. PSYCH:  Cognitively intact  EKG:  EKG is  ordered today. Sinus brady rate 58. Mild criteria for LVH. I have personally reviewed and interpreted this study.   Recent Labs: 12/20/2018: ALT 28; BUN 14; Creatinine, Ser 1.46; Hemoglobin 14.2; Platelets 193; Potassium 3.2; Sodium 139    Lipid Panel    Component Value Date/Time   CHOL 126 10/22/2018 0836   TRIG 97 10/22/2018 0836   HDL 35 (L) 10/22/2018 0836   CHOLHDL 3.6 10/22/2018 0836   CHOLHDL 3 11/23/2010 1015   VLDL 16.6 11/23/2010 1015   LDLCALC 73 10/22/2018 0836      Wt Readings from Last 3 Encounters:  02/17/19 180 lb (81.6 kg)  08/11/18 191 lb 6.4 oz (86.8 kg)  12/19/17 185 lb (83.9 kg)      Other studies Reviewed:  Cardiac cath films reviewed from [facility] 2015 Review of the above records demonstrates:   I have personally reviewed images.  There is diffuse 50% disease in the proximal and mid LAD. The distal LAD is occluded at the apex.  There is a large ramus intermediate branch with a critical ostial stenosis 99%. There is a 90% stenosis at the bifurcation of the ramus into 2 moderate sized branches. The LCx is small and has 80% ostial stenosis with occlusion of a small distal branch. The RCA has 50% mid vessel  disease. The PDA is occluded and there is a 90% stenosis at the origin of the PLOM.  Echo 10/06/17: Study Conclusions  - Left ventricle: The cavity size was normal. Wall thickness was   normal. Systolic function was normal. The estimated ejection   fraction was in the range of 60% to 65%. Wall motion was normal;   there were no regional wall motion abnormalities. Doppler   parameters are consistent with abnormal left ventricular   relaxation (grade 1 diastolic dysfunction). The E/e&' ratio is   between 8-15, suggesting indeterminate LV filling pressure. - Aortic valve: Trileaflet. Sclerosis without  stenosis. There was   no regurgitation. - Mitral valve: Mildly thickened leaflets . There was trivial   regurgitation. - Left atrium: The atrium was normal in size. - Right atrium: The atrium was mildly dilated. - Inferior vena cava: The vessel was normal in size. The   respirophasic diameter changes were in the normal range (>= 50%),   consistent with normal central venous pressure.  Impressions:  - Compared to a prior study in 07/2016, the LVEF is stable at   60-65%. There is no significant aortic stenosis.   ASSESSMENT AND PLAN:  1.  CAD with stable class 1-2 angina. Patient with known small vessel CAD.  Much of his disease involves small distal branches that are not amenable to revascularization. The ramus intermediate has severe ostial and mid vessel disease but PCI would be difficult with ostial location and potential plaque shift. The mid vessel lesion is at a bifurcation. His angiogram in 2015 was not significantly changed from 2010. Continue medical therapy with amlodipine.  He is not a candidate for beta blocker due to bradycardia. Imdur in the past was not tolerated. Ranexa has not resulted in any improvement in symptoms. He is currently with class 1-2 symptoms.   2. HTN. Well controlled. Unclear if he is taking 5 or 10 mg of amlodipine. He will check his bottle to be sure.    3. Hypercholesterolemia.  On Lipitor. No LFTs or lipid panel for 2 years. Will order CMET and lipid panel.   4. Vertigo. Suspect BPV. Resolved.    Current medicines are reviewed at length with the patient today.  The patient does not have concerns regarding medicines.  The following changes have been made:  See above  Labs/ tests ordered today include:   No orders of the defined types were placed in this encounter.  Follow up in one year.   Signed, Vale Mousseau [place], MD  09/04/2019 9:49 AM    [facility] [address], [place], [place], [address] Phone [phone], Fax [phone]

## 2019-09-08 ENCOUNTER — Ambulatory Visit: Payer: BC Managed Care – PPO | Admitting: Cardiology

## 2019-09-13 ENCOUNTER — Telehealth: Payer: Self-pay | Admitting: Cardiology

## 2019-09-13 NOTE — Telephone Encounter (Signed)
LVM for patient to return call for follow up to be scheduled from recall list with [place]

## 2020-01-22 ENCOUNTER — Other Ambulatory Visit: Payer: Self-pay | Admitting: Cardiology

## 2020-07-31 ENCOUNTER — Emergency Department (HOSPITAL_COMMUNITY): Payer: No Typology Code available for payment source

## 2020-07-31 ENCOUNTER — Emergency Department (HOSPITAL_COMMUNITY)
Admission: EM | Admit: 2020-07-31 | Discharge: 2020-07-31 | Disposition: A | Discharge status: Home / Self Care | Payer: No Typology Code available for payment source | Attending: Emergency Medicine | Admitting: Emergency Medicine

## 2020-07-31 ENCOUNTER — Other Ambulatory Visit: Payer: Self-pay

## 2020-07-31 ENCOUNTER — Encounter (HOSPITAL_COMMUNITY): Payer: Self-pay

## 2020-07-31 DIAGNOSIS — I700 Atherosclerosis of aorta: Secondary | ICD-10-CM | POA: Insufficient documentation

## 2020-07-31 DIAGNOSIS — Z79899 Other long term (current) drug therapy: Secondary | ICD-10-CM | POA: Insufficient documentation

## 2020-07-31 DIAGNOSIS — S50312A Abrasion of left elbow, initial encounter: Secondary | ICD-10-CM | POA: Insufficient documentation

## 2020-07-31 DIAGNOSIS — S7012XA Contusion of left thigh, initial encounter: Secondary | ICD-10-CM | POA: Diagnosis not present

## 2020-07-31 DIAGNOSIS — N182 Chronic kidney disease, stage 2 (mild): Secondary | ICD-10-CM | POA: Insufficient documentation

## 2020-07-31 DIAGNOSIS — Z7902 Long term (current) use of antithrombotics/antiplatelets: Secondary | ICD-10-CM | POA: Diagnosis not present

## 2020-07-31 DIAGNOSIS — I129 Hypertensive chronic kidney disease with stage 1 through stage 4 chronic kidney disease, or unspecified chronic kidney disease: Secondary | ICD-10-CM | POA: Diagnosis not present

## 2020-07-31 DIAGNOSIS — Y900 Blood alcohol level of less than 20 mg/100 ml: Secondary | ICD-10-CM | POA: Diagnosis not present

## 2020-07-31 DIAGNOSIS — S7002XA Contusion of left hip, initial encounter: Secondary | ICD-10-CM | POA: Insufficient documentation

## 2020-07-31 DIAGNOSIS — W19XXXA Unspecified fall, initial encounter: Secondary | ICD-10-CM

## 2020-07-31 DIAGNOSIS — I2510 Atherosclerotic heart disease of native coronary artery without angina pectoris: Secondary | ICD-10-CM | POA: Diagnosis not present

## 2020-07-31 DIAGNOSIS — S6392XA Sprain of unspecified part of left wrist and hand, initial encounter: Secondary | ICD-10-CM | POA: Diagnosis not present

## 2020-07-31 DIAGNOSIS — S63502A Unspecified sprain of left wrist, initial encounter: Secondary | ICD-10-CM

## 2020-07-31 DIAGNOSIS — W11XXXA Fall on and from ladder, initial encounter: Secondary | ICD-10-CM | POA: Insufficient documentation

## 2020-07-31 DIAGNOSIS — S6992XA Unspecified injury of left wrist, hand and finger(s), initial encounter: Secondary | ICD-10-CM | POA: Diagnosis present

## 2020-07-31 DIAGNOSIS — T1490XA Injury, unspecified, initial encounter: Secondary | ICD-10-CM

## 2020-07-31 LAB — URINALYSIS, ROUTINE W REFLEX MICROSCOPIC
Bacteria, UA: NONE SEEN
Bilirubin Urine: NEGATIVE
Glucose, UA: NEGATIVE mg/dL
Hgb urine dipstick: NEGATIVE
Ketones, ur: NEGATIVE mg/dL
Leukocytes,Ua: NEGATIVE
Nitrite: NEGATIVE
Protein, ur: NEGATIVE mg/dL
Specific Gravity, Urine: 1.021 (ref 1.005–1.030)
pH: 6 (ref 5.0–8.0)

## 2020-07-31 LAB — LACTIC ACID, PLASMA: Lactic Acid, Venous: 1.8 mmol/L (ref 0.5–1.9)

## 2020-07-31 LAB — I-STAT CHEM 8, ED
BUN: 16 mg/dL (ref 6–20)
Calcium, Ion: 1.1 mmol/L — ABNORMAL LOW (ref 1.15–1.40)
Chloride: 103 mmol/L (ref 98–111)
Creatinine, Ser: 1.3 mg/dL — ABNORMAL HIGH (ref 0.61–1.24)
Glucose, Bld: 109 mg/dL — ABNORMAL HIGH (ref 70–99)
HCT: 44 % (ref 39.0–52.0)
Hemoglobin: 15 g/dL (ref 13.0–17.0)
Potassium: 3.2 mmol/L — ABNORMAL LOW (ref 3.5–5.1)
Sodium: 140 mmol/L (ref 135–145)
TCO2: 24 mmol/L (ref 22–32)

## 2020-07-31 LAB — COMPREHENSIVE METABOLIC PANEL
ALT: 34 U/L (ref 0–44)
AST: 37 U/L (ref 15–41)
Albumin: 3.9 g/dL (ref 3.5–5.0)
Alkaline Phosphatase: 98 U/L (ref 38–126)
Anion gap: 7 (ref 5–15)
BUN: 13 mg/dL (ref 6–20)
CO2: 25 mmol/L (ref 22–32)
Calcium: 9.2 mg/dL (ref 8.9–10.3)
Chloride: 106 mmol/L (ref 98–111)
Creatinine, Ser: 1.35 mg/dL — ABNORMAL HIGH (ref 0.61–1.24)
GFR, Estimated: >60 mL/min (ref >60)
Glucose, Bld: 108 mg/dL — ABNORMAL HIGH (ref 70–99)
Potassium: 3.4 mmol/L — ABNORMAL LOW (ref 3.5–5.1)
Sodium: 138 mmol/L (ref 135–145)
Total Bilirubin: 0.8 mg/dL (ref 0.3–1.2)
Total Protein: 7.1 g/dL (ref 6.5–8.1)

## 2020-07-31 LAB — SAMPLE TO BLOOD BANK

## 2020-07-31 LAB — CBC
HCT: 44.5 % (ref 39.0–52.0)
Hemoglobin: 14.8 g/dL (ref 13.0–17.0)
MCH: 28.5 pg (ref 26.0–34.0)
MCHC: 33.3 g/dL (ref 30.0–36.0)
MCV: 85.7 fL (ref 80.0–100.0)
Platelets: 215 10*3/uL (ref 150–400)
RBC: 5.19 MIL/uL (ref 4.22–5.81)
RDW: 13.8 % (ref 11.5–15.5)
WBC: 8 10*3/uL (ref 4.0–10.5)
nRBC: 0 % (ref 0.0–0.2)

## 2020-07-31 LAB — PROTIME-INR
INR: 1 (ref 0.8–1.2)
Prothrombin Time: 13.4 seconds (ref 11.4–15.2)

## 2020-07-31 LAB — ETHANOL: Alcohol, Ethyl (B): <10 mg/dL (ref <10)

## 2020-07-31 MED ORDER — OXYCODONE-ACETAMINOPHEN 5-325 MG PO TABS: 1.0000 | ORAL_TABLET | Freq: Four times a day (QID) | ORAL | 0 refills | Status: DC | PRN

## 2020-07-31 MED ORDER — OXYCODONE-ACETAMINOPHEN 5-325 MG PO TABS
2.0000 | ORAL_TABLET | Freq: Once | ORAL | Status: AC
  Administered 2020-07-31: 2 via ORAL
  Filled 2020-07-31: qty 2

## 2020-07-31 MED ORDER — SODIUM CHLORIDE 0.9 % IV BOLUS
125.0000 mL | Freq: Once | INTRAVENOUS | Status: AC
  Administered 2020-07-31: 125 mL via INTRAVENOUS

## 2020-07-31 MED ORDER — NAPROXEN 500 MG PO TABS: 500.0000 mg | ORAL_TABLET | Freq: Two times a day (BID) | ORAL | 0 refills | Status: DC

## 2020-07-31 MED ORDER — METHOCARBAMOL 750 MG PO TABS: 750.0000 mg | ORAL_TABLET | Freq: Three times a day (TID) | ORAL | 0 refills | Status: DC | PRN

## 2020-07-31 MED ORDER — KETOROLAC TROMETHAMINE 30 MG/ML IJ SOLN
30.0000 mg | Freq: Once | INTRAMUSCULAR | Status: AC
  Administered 2020-07-31: 30 mg via INTRAVENOUS
  Filled 2020-07-31: qty 1

## 2020-07-31 MED ORDER — IOHEXOL 300 MG/ML  SOLN
100.0000 mL | Freq: Once | INTRAMUSCULAR | Status: AC | PRN
  Administered 2020-07-31: 100 mL via INTRAVENOUS

## 2020-07-31 MED ORDER — FENTANYL CITRATE (PF) 100 MCG/2ML IJ SOLN
100.0000 ug | INTRAMUSCULAR | Status: DC | PRN
  Administered 2020-07-31: 100 ug via INTRAVENOUS
  Filled 2020-07-31: qty 2

## 2020-07-31 NOTE — ED Notes (Signed)
Ortho called for brace, sling and immobilizer

## 2020-07-31 NOTE — ED Triage Notes (Signed)
Pt arrives via [facility] for fall from ladder. Pt was approximately 40' in the air and fell onto concrete. Pt denies LOC, c/o L hip, L shoulder, L elbow pain, L wrist. Pt in c-collar per EMS. Pt is on plavix, no other blood thinners.   #18 R AC   EMS last VS 120/74, HR 50, 100% on RA, GCS 15.   100 mcg fentanyl administered by EMS

## 2020-07-31 NOTE — ED Provider Notes (Addendum)
[facility] EMERGENCY DEPARTMENT Provider Note   CSN: [id number] Arrival date & time: 07/31/20  1017     History Chief Complaint  Patient presents with   Nathan Barrett is a 55 y.o. male.  HPI Patient was cleaning a dryer vent on a 15 foot ladder.  Nathan Barrett fell and landed predominantly on the left side of his body.  Nathan Barrett reports most of his pain is in his left hip and back and left wrist.  No loss of consciousness.  Patient denies focal weakness numbness or tingling.  No severe headache.  No vomiting.  No difficulty breathing.  No focal numbness or extremity dysfunction.  Patient takes Plavix daily.    Past Medical History:  Diagnosis Date   Aortic stenosis    Mild   Bradycardia    CKD (chronic kidney disease), stage II    Coronary artery disease    a. multivessel, treated medically.   H. pylori infection    Hypercholesterolemia    Hypertension     Patient Active Problem List   Diagnosis Date Noted   Syncope 04/09/2012   Coronary artery disease    Hypercholesterolemia    Hypertension     Past Surgical History:  Procedure Laterality Date   CARDIAC CATHETERIZATION     Ejection Fraction 65%   MASS EXCISION N/A 04/05/2016   Procedure: EXCISION OF LOWER LIP BENIGN LESION LESS THAN 1 CM AND LAYERED CLOSURE LIP 1.5 CM. ;  Surgeon: Irene Limbo, MD;  Location: [place];  Service: Plastics;  Laterality: N/A;   WISDOM TOOTH EXTRACTION         Family History  Problem Relation Age of Onset   Leukemia Mother    Heart disease Father        CABG   Obstructive Sleep Apnea Brother     Social History   Tobacco Use   Smoking status: Never   Smokeless tobacco: Never  Vaping Use   Vaping Use: Never used  Substance Use Topics   Alcohol use: No    Alcohol/week: 0.0 standard drinks   Drug use: No    Home Medications Prior to Admission medications   Medication Sig Start Date End Date Taking? Authorizing Provider  amLODipine  (NORVASC) 5 MG tablet Take 1 tablet (5 mg total) by mouth daily. 07/21/19  Yes [place]ique, Peter M, MD  atenolol (TENORMIN) 25 MG tablet Take 25 mg by mouth daily.   Yes [provider]  atorvastatin (LIPITOR) 80 MG tablet Take 1 tablet (80 mg total) by mouth every morning. 07/21/19  Yes [place], Peter M, MD  clopidogrel (PLAVIX) 75 MG tablet TAKE 1 TABLET BY MOUTH EVERY DAY Patient taking differently: Take 75 mg by mouth daily. 01/25/20  Yes [place], Peter M, MD  methocarbamol (ROBAXIN-750) 750 MG tablet Take 1 tablet (750 mg total) by mouth every 8 (eight) hours as needed for muscle spasms. 07/31/20  Yes Charlesetta Shanks, MD  Multiple Vitamin (MULTIVITAMIN WITH MINERALS) TABS tablet Take 1 tablet by mouth daily.   Yes [provider]  naproxen (NAPROSYN) 500 MG tablet Take 1 tablet (500 mg total) by mouth 2 (two) times daily. 07/31/20  Yes Omer Puccinelli, Jeannie Done, MD  olmesartan-hydrochlorothiazide (BENICAR HCT) 40-25 MG tablet Take 1 tablet by mouth daily.   Yes [provider]  oxyCODONE-acetaminophen (PERCOCET) 5-325 MG tablet Take 1-2 tablets by mouth every 6 (six) hours as needed. 07/31/20  Yes Charlesetta Shanks, MD  naproxen (NAPROSYN) 500  MG tablet Take 1 tablet (500 mg total) by mouth 2 (two) times daily as needed. Patient not taking: Reported on 07/31/2020 02/17/19   Zigmund Gottron, NP  olmesartan-hydrochlorothiazide (BENICAR HCT) 20-12.5 MG tablet TAKE 1 TABLET BY MOUTH EVERY DAY Patient not taking: Reported on 07/31/2020 01/25/20   [place], Peter M, MD    Allergies    Patient has no known allergies.  Review of Systems   Review of Systems 10 systems reviewed negative except as per HPI Physical Exam Updated Vital Signs BP 109/75   Pulse (!) 51   Temp 97.7 F (36.5 C) (Oral)   Resp 13   Ht 5\' 5"  (1.651 m)   Wt 87.5 kg   SpO2 100%   BMI 32.12 kg/m   Physical Exam Constitutional:      Comments: Alert with clear mental status no respiratory distress GCS 15  HENT:      Head: Normocephalic and atraumatic.     Nose: Nose normal.     Mouth/Throat:     Mouth: Mucous membranes are moist.     Pharynx: Oropharynx is clear.  Eyes:     Extraocular Movements: Extraocular movements intact.     Pupils: Pupils are equal, round, and reactive to light.  Neck:     Comments: Cervical collar maintained.  No stridor or difficulty breathing. Cardiovascular:     Rate and Rhythm: Normal rate and regular rhythm.  Pulmonary:     Effort: Pulmonary effort is normal.     Breath sounds: Normal breath sounds.  Abdominal:     General: There is no distension.     Palpations: Abdomen is soft.     Tenderness: There is no abdominal tenderness. There is no guarding.  Genitourinary:    Penis: Normal.   Musculoskeletal:     Comments: Abrasion left elbow.  Patient can flex and extend left elbow.  Normal range of motion left shoulder.  Pain at left wrist without gross deformity.  Significant pain in the left lateral hip area on the lower extremity.  No obvious deformity.  Pain with range of motion of the hip.  Both feet are warm and dry with 2+ pulses.  Neurological:     General: No focal deficit present.     Mental Status: Nathan Barrett is oriented to person, place, and time.     Cranial Nerves: No cranial nerve deficit.     Motor: No weakness.    ED Results / Procedures / Treatments   Labs (all labs ordered are listed, but only abnormal results are displayed) Labs Reviewed  COMPREHENSIVE METABOLIC PANEL - Abnormal; Notable for the following components:      Result Value   Potassium 3.4 (*)    Glucose, Bld 108 (*)    Creatinine, Ser 1.35 (*)    All other components within normal limits  I-STAT CHEM 8, ED - Abnormal; Notable for the following components:   Potassium 3.2 (*)    Creatinine, Ser 1.30 (*)    Glucose, Bld 109 (*)    Calcium, Ion 1.10 (*)    All other components within normal limits  CBC  ETHANOL  URINALYSIS, ROUTINE W REFLEX MICROSCOPIC  LACTIC ACID, PLASMA   PROTIME-INR  SAMPLE TO BLOOD BANK    EKG None  Radiology DG Elbow 2 Views Left  Result Date: 07/31/2020 CLINICAL DATA:  Acute LEFT elbow pain following fall. Initial encounter. EXAM: LEFT ELBOW - 2 VIEW COMPARISON:  None. FINDINGS: Tiny bony densities along the posterior proximal  olecranon noted along the LATERAL view, of uncertain chronicity. Mild posterior soft tissue swelling is noted. No other fracture, subluxation or dislocation identified. No joint effusion is noted. IMPRESSION: 1. Tiny bony densities along the posterior proximal olecranon of uncertain chronicity but may represent tiny fracture fragments. 2. Mild posterior soft tissue swelling. Electronically Signed   By: Margarette Canada M.D.   On: 07/31/2020 13:04   DG Wrist 2 Views Left  Result Date: 07/31/2020 CLINICAL DATA:  Status post fall from a ladder. EXAM: LEFT WRIST - 2 VIEW COMPARISON:  None. FINDINGS: No acute fracture or dislocation. No aggressive osseous lesion. Soft tissue swelling along the dorsal aspect of the wrist. IMPRESSION: No acute osseous injury of the left wrist. If there is clinical concern regarding a radiographically occult scaphoid fracture or snuff box tenderness recommend a oblique and scaphoid view of the left wrist. Electronically Signed   By: Kathreen Devoid   On: 07/31/2020 13:03   CT Head Wo Contrast  Result Date: 07/31/2020 CLINICAL DATA:  Status post fall from a ladder.  Neck pain. EXAM: CT HEAD WITHOUT CONTRAST CT CERVICAL SPINE WITHOUT CONTRAST TECHNIQUE: Multidetector CT imaging of the head and cervical spine was performed following the standard protocol without intravenous contrast. Multiplanar CT image reconstructions of the cervical spine were also generated. COMPARISON:  05/07/2004 FINDINGS: CT HEAD FINDINGS Brain: No evidence of acute infarction, hemorrhage, hydrocephalus, extra-axial collection or mass lesion/mass effect. Vascular: No hyperdense vessel or unexpected calcification. Skull: No osseous  abnormality. Sinuses/Orbits: Visualized paranasal sinuses are clear. Visualized mastoid sinuses are clear. Visualized orbits demonstrate no focal abnormality. Other: No fluid collection or hematoma. CT CERVICAL SPINE FINDINGS Alignment: Normal. Skull base and vertebrae: No acute fracture. No primary bone lesion or focal pathologic process. Soft tissues and spinal canal: No prevertebral fluid or swelling. No visible canal hematoma. Disc levels: Degenerative disease with disc height loss throughout the cervical spine. Bilateral uncovertebral degenerative changes at C3-4, C4-5, C5-6, C6-7 and C7-T1 with bilateral foraminal encroachment. Upper chest: Lung apices are clear. Other: Enlarged left thyroid gland with coarse dystrophic calcifications with substernal extension and rightward deviation of the trachea as can be seen with a thyroid goiter. IMPRESSION: 1. No acute intracranial pathology. 2.  No acute osseous injury of the cervical spine. 3. Cervical spine spondylosis as described above. Electronically Signed   By: Kathreen Devoid   On: 07/31/2020 12:10   CT Cervical Spine Wo Contrast  Result Date: 07/31/2020 CLINICAL DATA:  Status post fall from a ladder.  Neck pain. EXAM: CT HEAD WITHOUT CONTRAST CT CERVICAL SPINE WITHOUT CONTRAST TECHNIQUE: Multidetector CT imaging of the head and cervical spine was performed following the standard protocol without intravenous contrast. Multiplanar CT image reconstructions of the cervical spine were also generated. COMPARISON:  05/07/2004 FINDINGS: CT HEAD FINDINGS Brain: No evidence of acute infarction, hemorrhage, hydrocephalus, extra-axial collection or mass lesion/mass effect. Vascular: No hyperdense vessel or unexpected calcification. Skull: No osseous abnormality. Sinuses/Orbits: Visualized paranasal sinuses are clear. Visualized mastoid sinuses are clear. Visualized orbits demonstrate no focal abnormality. Other: No fluid collection or hematoma. CT CERVICAL SPINE FINDINGS  Alignment: Normal. Skull base and vertebrae: No acute fracture. No primary bone lesion or focal pathologic process. Soft tissues and spinal canal: No prevertebral fluid or swelling. No visible canal hematoma. Disc levels: Degenerative disease with disc height loss throughout the cervical spine. Bilateral uncovertebral degenerative changes at C3-4, C4-5, C5-6, C6-7 and C7-T1 with bilateral foraminal encroachment. Upper chest: Lung apices are clear. Other: Enlarged  left thyroid gland with coarse dystrophic calcifications with substernal extension and rightward deviation of the trachea as can be seen with a thyroid goiter. IMPRESSION: 1. No acute intracranial pathology. 2.  No acute osseous injury of the cervical spine. 3. Cervical spine spondylosis as described above. Electronically Signed   By: Kathreen Devoid   On: 07/31/2020 12:10   DG Pelvis Portable  Result Date: 07/31/2020 CLINICAL DATA:  Acute pelvic pain following fall. Initial encounter. EXAM: PORTABLE PELVIS 1-2 VIEWS COMPARISON:  None. FINDINGS: There is no evidence of pelvic fracture or diastasis. No pelvic bone lesions are seen. IMPRESSION: Negative. Electronically Signed   By: Margarette Canada M.D.   On: 07/31/2020 11:05   CT CHEST ABDOMEN PELVIS W CONTRAST  Result Date: 07/31/2020 CLINICAL DATA:  15 foot fall from ladder EXAM: CT CHEST, ABDOMEN, AND PELVIS WITH CONTRAST TECHNIQUE: Multidetector CT imaging of the chest, abdomen and pelvis was performed following the standard protocol during bolus administration of intravenous contrast. CONTRAST:  152mL OMNIPAQUE IOHEXOL 300 MG/ML  SOLN COMPARISON:  CT C-spine May 07, 2004. FINDINGS: CT CHEST FINDINGS Cardiovascular: Scattered aortic atherosclerosis without aneurysmal dilation. Normal caliber central pulmonary arteries. Three-vessel coronary artery disease. Normal size heart. No significant pericardial effusion/thickening. Mediastinum/Nodes: Heterogeneous partially calcified left thyroid mass is again  visualized measuring approximately 5.6 x 4.9 cm previously 3.7 x 3.6 cm. Although previously evaluated with ultrasound biopsy suggest re-evaluation with nonemergent thyroid ultrasound given change in size and appearance. (ref: J Am Coll Radiol. 2015 Feb;12(2): 143-50).Prominent/mildly enlarged lower cervical lymph nodes measuring up to 11 mm on the right on image 1/3. No pathologically enlarged mediastinal, hilar or axillary lymph nodes. The trachea and esophagus are grossly unremarkable. Lungs/Pleura: Hypoventilatory change in the dependent lungs. No pleural effusion. No pneumothorax. No parenchymal contusion/laceration visualized. Musculoskeletal: No chest wall mass or suspicious bone lesions identified. CT ABDOMEN PELVIS FINDINGS Hepatobiliary: No suspicious hepatic lesion. Gallbladder is unremarkable. No biliary ductal dilation. Pancreas: Within normal limits. Spleen: Within normal limits. Adrenals/Urinary Tract: Adrenal glands are unremarkable. Kidneys are normal, without renal calculi, solid enhancing lesion, or hydronephrosis. Bladder is unremarkable for degree of distension. Stomach/Bowel: Stomach is within normal limits. Appendix appears normal. No evidence of bowel wall thickening, distention, or inflammatory changes. Vascular/Lymphatic: Aortic atherosclerosis. No enlarged abdominal or pelvic lymph nodes. Reproductive: Prostate is unremarkable. Other: No abdominal wall hernia or abnormality. No abdominopelvic ascites. No abdominopelvic ascites Musculoskeletal: No fracture is seen. IMPRESSION: 1. No evidence of acute trauma in chest, abdomen, or pelvis. 2. Increased size a heterogeneous, partially calcified left thyroid mass now measuring 5.6 cm. Although previously evaluated with ultrasound biopsy suggest re-evaluation with nonemergent thyroid ultrasound and possible FNA given interval change in size and appearance. 3. Prominent/mildly enlarged lower cervical lymph nodes measuring up to 11 mm, nonspecific  and possibly reactive but, in setting of thyroid neoplasm these would be suspicious for disease involvement. 4. Three-vessel coronary artery disease. Please note that although the presence of coronary artery calcium documents the presence of coronary artery disease, the severity of this disease and any potential stenosis cannot be assessed on this non-gated CT examination. Assessment for potential risk factor modification, dietary therapy or pharmacologic therapy may be warranted, if clinically indicated 5. Aortic atherosclerosis.  Aortic Atherosclerosis (ICD10-I70.0). Electronically Signed   By: Dahlia Bailiff MD   On: 07/31/2020 11:59   DG Chest Port 1 View  Result Date: 07/31/2020 CLINICAL DATA:  Acute fall with pain. EXAM: PORTABLE CHEST 1 VIEW COMPARISON:  12/19/2017 and prior  radiographs FINDINGS: The cardiomediastinal silhouette is unchanged with fullness of the SUPERIOR LEFT mediastinum again noted previously identified as an enlarged LEFT thyroid. There is no evidence of focal airspace disease, pulmonary edema, suspicious pulmonary nodule/mass, pleural effusion, or pneumothorax. No acute bony abnormalities are identified. IMPRESSION: No active disease. Electronically Signed   By: Margarette Canada M.D.   On: 07/31/2020 11:04    Procedures Procedures   Medications Ordered in ED Medications  fentaNYL (SUBLIMAZE) injection 100 mcg (100 mcg Intravenous Given 07/31/20 1042)  oxyCODONE-acetaminophen (PERCOCET/ROXICET) 5-325 MG per tablet 2 tablet (has no administration in time range)  sodium chloride 0.9 % bolus 125 mL (0 mLs Intravenous Stopped 07/31/20 1045)  iohexol (OMNIPAQUE) 300 MG/ML solution 100 mL (100 mLs Intravenous Contrast Given 07/31/20 1138)  ketorolac (TORADOL) 30 MG/ML injection 30 mg (30 mg Intravenous Given 07/31/20 1306)    ED Course  I have reviewed the triage vital signs and the nursing notes.  Pertinent labs & imaging results that were available during my care of the patient were  reviewed by me and considered in my medical decision making (see chart for details).    MDM Rules/Calculators/A&P                          Patient with fall from significant height from a ladder.  Patient does take Plavix.  Level 2 trauma.  We will proceed with full diagnostic evaluation for traumatic injury with pain most pronounced at this time in the left hip and back.  CT scans and x-rays do not show acute fractures.  Possible avulsion fracture on the elbow x-ray.  Questionable chronicity per radiology.  Patient has excellent range of motion without effusion.  At this time I have fairly low suspicion for an acute fracture of the elbow.  Patient will be placed in a wrist splint for wrist pain and a sling possible occult elbow fracture.  CT scan does not show any fracture of the hip or the pelvis.  Will have patient take naproxen, Robaxin and Tylenol for pain control.  Return precautions reviewed.  I did review the findings on the CT scan regarding the patient's thyroid nodule and the need for a repeat evaluation.  Patient voices understanding.  Patient did report significant lateral thigh pain after assessing for ambulation.  Plain film x-ray of the femur does not show any fracture.  Patient does not have any focal visible large hematoma.  I do suspect deeper hematoma likely within the muscle although at this time it is not particularly prominent.  I anticipate patient will see if additional bruising and discoloration over the next several days.  Will recommend trying to be limited in weightbearing and a lot of elevating and icing with follow-up with orthopedics. Final Clinical Impression(s) / ED Diagnoses Final diagnoses:  Fall  Fall, initial encounter  Contusion of left hip and thigh, initial encounter  Sprain of left wrist, initial encounter  Abrasion of left elbow, initial encounter    Rx / DC Orders ED Discharge Orders          Ordered    naproxen (NAPROSYN) 500 MG tablet  2 times  daily        07/31/20 1344    methocarbamol (ROBAXIN-750) 750 MG tablet  Every 8 hours PRN        07/31/20 1344    oxyCODONE-acetaminophen (PERCOCET) 5-325 MG tablet  Every 6 hours PRN        07/31/20  [place], MD 07/31/20 1353    Charlesetta Shanks, MD 07/31/20 1500

## 2020-07-31 NOTE — Discharge Instructions (Addendum)
1.  Follow instructions for resting areas that are bruised and strained.  Apply ice packs 20 minutes every 2 hours.  Take naproxen as prescribed twice a day and Robaxin for muscle relaxer if needed.  Keep abrasions clean and dry with antibiotic ointment in place.  If you need additional pain control, you may also take 1-2 Percocet tablets as prescribed. 2.  Schedule a follow-up appointment with the orthopedic doctor for reassessment of your wrist and elbow.  The x-ray shows a possible minor fracture at the elbow.  Wear the sling as provided.  You may remove your sling and splint when you bathe.  Keep the elbow and wrist stationary when you do and then replace them. 3.  Return to the emergency department if you have new or worsening symptoms.  Return for weakness or numbness in the legs, new or worsening pain in the back abdomen or chest.

## 2020-07-31 NOTE — ED Notes (Signed)
CT notified of pt's ready status with I-Stat results available. Notified to wait for open scanner.

## 2020-07-31 NOTE — ED Notes (Addendum)
Pt taken to CT at 1105.  -- Delay due to no CT room being available.  TRN and primary nurse called multiple times to try and expedite process.

## 2020-07-31 NOTE — ED Notes (Signed)
Trauma nurse and I called CT for update on room availability. No rooms available at this time

## 2021-07-11 HISTORY — PX: CORONARY ARTERY BYPASS GRAFT: SHX141

## 2021-07-16 ENCOUNTER — Emergency Department (HOSPITAL_COMMUNITY)
Admission: EM | Admit: 2021-07-16 | Discharge: 2021-07-17 | Disposition: A | Discharge status: Other Acute Inpatient Hospital | Payer: No Typology Code available for payment source | Attending: Emergency Medicine | Admitting: Emergency Medicine

## 2021-07-16 ENCOUNTER — Emergency Department (HOSPITAL_COMMUNITY): Payer: No Typology Code available for payment source

## 2021-07-16 ENCOUNTER — Encounter (HOSPITAL_COMMUNITY): Payer: Self-pay

## 2021-07-16 ENCOUNTER — Other Ambulatory Visit: Payer: Self-pay

## 2021-07-16 DIAGNOSIS — R0602 Shortness of breath: Secondary | ICD-10-CM | POA: Diagnosis present

## 2021-07-16 DIAGNOSIS — I10 Essential (primary) hypertension: Secondary | ICD-10-CM | POA: Insufficient documentation

## 2021-07-16 DIAGNOSIS — Z7902 Long term (current) use of antithrombotics/antiplatelets: Secondary | ICD-10-CM | POA: Insufficient documentation

## 2021-07-16 DIAGNOSIS — R824 Acetonuria: Secondary | ICD-10-CM | POA: Diagnosis not present

## 2021-07-16 DIAGNOSIS — Z20822 Contact with and (suspected) exposure to covid-19: Secondary | ICD-10-CM | POA: Diagnosis not present

## 2021-07-16 DIAGNOSIS — Z79899 Other long term (current) drug therapy: Secondary | ICD-10-CM | POA: Insufficient documentation

## 2021-07-16 DIAGNOSIS — I2510 Atherosclerotic heart disease of native coronary artery without angina pectoris: Secondary | ICD-10-CM | POA: Insufficient documentation

## 2021-07-16 DIAGNOSIS — J90 Pleural effusion, not elsewhere classified: Secondary | ICD-10-CM | POA: Insufficient documentation

## 2021-07-16 DIAGNOSIS — L03116 Cellulitis of left lower limb: Secondary | ICD-10-CM | POA: Insufficient documentation

## 2021-07-16 LAB — BASIC METABOLIC PANEL
Anion gap: 11 (ref 5–15)
BUN: 10 mg/dL (ref 6–20)
CO2: 23 mmol/L (ref 22–32)
Calcium: 8.6 mg/dL — ABNORMAL LOW (ref 8.9–10.3)
Chloride: 105 mmol/L (ref 98–111)
Creatinine, Ser: 1.31 mg/dL — ABNORMAL HIGH (ref 0.61–1.24)
GFR, Estimated: >60 mL/min (ref >60)
Glucose, Bld: 101 mg/dL — ABNORMAL HIGH (ref 70–99)
Potassium: 3.7 mmol/L (ref 3.5–5.1)
Sodium: 139 mmol/L (ref 135–145)

## 2021-07-16 LAB — URINALYSIS, ROUTINE W REFLEX MICROSCOPIC
Bilirubin Urine: NEGATIVE
Glucose, UA: NEGATIVE mg/dL
Hgb urine dipstick: NEGATIVE
Ketones, ur: 20 mg/dL — AB
Leukocytes,Ua: NEGATIVE
Nitrite: NEGATIVE
Protein, ur: NEGATIVE mg/dL
Specific Gravity, Urine: 1.013 (ref 1.005–1.030)
pH: 8 (ref 5.0–8.0)

## 2021-07-16 LAB — HEPATIC FUNCTION PANEL
ALT: 57 U/L — ABNORMAL HIGH (ref 0–44)
AST: 65 U/L — ABNORMAL HIGH (ref 15–41)
Albumin: 2.9 g/dL — ABNORMAL LOW (ref 3.5–5.0)
Alkaline Phosphatase: 165 U/L — ABNORMAL HIGH (ref 38–126)
Bilirubin, Direct: 0.2 mg/dL (ref 0.0–0.2)
Indirect Bilirubin: 0.6 mg/dL (ref 0.3–0.9)
Total Bilirubin: 0.8 mg/dL (ref 0.3–1.2)
Total Protein: 6.8 g/dL (ref 6.5–8.1)

## 2021-07-16 LAB — PROTIME-INR
INR: 1 (ref 0.8–1.2)
Prothrombin Time: 13.2 seconds (ref 11.4–15.2)

## 2021-07-16 LAB — CBC
HCT: 33.1 % — ABNORMAL LOW (ref 39.0–52.0)
Hemoglobin: 10.6 g/dL — ABNORMAL LOW (ref 13.0–17.0)
MCH: 27 pg (ref 26.0–34.0)
MCHC: 32 g/dL (ref 30.0–36.0)
MCV: 84.4 fL (ref 80.0–100.0)
Platelets: 300 10*3/uL (ref 150–400)
RBC: 3.92 MIL/uL — ABNORMAL LOW (ref 4.22–5.81)
RDW: 14.5 % (ref 11.5–15.5)
WBC: 8.6 10*3/uL (ref 4.0–10.5)
nRBC: 0.2 % (ref 0.0–0.2)

## 2021-07-16 LAB — TROPONIN I (HIGH SENSITIVITY)
Troponin I (High Sensitivity): 126 ng/L (ref <18)
Troponin I (High Sensitivity): 127 ng/L (ref <18)

## 2021-07-16 LAB — SARS CORONAVIRUS 2 BY RT PCR: SARS Coronavirus 2 by RT PCR: NEGATIVE

## 2021-07-16 LAB — LACTIC ACID, PLASMA: Lactic Acid, Venous: 1.3 mmol/L (ref 0.5–1.9)

## 2021-07-16 LAB — APTT: aPTT: 36 seconds (ref 24–36)

## 2021-07-16 MED ORDER — VANCOMYCIN HCL IN DEXTROSE 1-5 GM/200ML-% IV SOLN
1000.0000 mg | Freq: Once | INTRAVENOUS | Status: AC
  Administered 2021-07-16: 1000 mg via INTRAVENOUS
  Filled 2021-07-16: qty 200

## 2021-07-16 MED ORDER — SODIUM CHLORIDE 0.9 % IV SOLN
2.0000 g | Freq: Once | INTRAVENOUS | Status: AC
  Administered 2021-07-16: 2 g via INTRAVENOUS
  Filled 2021-07-16: qty 12.5

## 2021-07-16 MED ORDER — ACETAMINOPHEN 325 MG PO TABS
650.0000 mg | ORAL_TABLET | Freq: Four times a day (QID) | ORAL | Status: DC | PRN
  Administered 2021-07-16: 650 mg via ORAL
  Filled 2021-07-16: qty 2

## 2021-07-16 MED ORDER — MORPHINE SULFATE (PF) 4 MG/ML IV SOLN
4.0000 mg | Freq: Once | INTRAVENOUS | Status: AC
  Administered 2021-07-17: 4 mg via INTRAVENOUS
  Filled 2021-07-16: qty 1

## 2021-07-16 MED ORDER — MORPHINE SULFATE (PF) 2 MG/ML IV SOLN
2.0000 mg | Freq: Once | INTRAVENOUS | Status: AC
  Administered 2021-07-16: 2 mg via INTRAVENOUS
  Filled 2021-07-16: qty 1

## 2021-07-16 NOTE — ED Notes (Signed)
Transport has been called.  

## 2021-07-16 NOTE — ED Provider Notes (Cosign Needed Addendum)
[facility] EMERGENCY DEPARTMENT Provider Note   CSN: [id number] Arrival date & time: 07/16/21  1957     History  Chief Complaint  Patient presents with   Shortness of Breath    Nathan Barrett is a 56 y.o. male.  HPI Patient is a 56 year old male with a history of hypertension who presents to the emergency department due to shortness of breath.  Patient recently admitted to 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  Patient underwent grafting as well as a quadruple bypass.  He was discharged yesterday.  States that today he began experiencing shortness of breath once again.  Denies any chest pain.  Also notes a cough as well as worsening swelling and pain to the left lower leg at the site of his graft.    Home Medications Prior to Admission medications   Medication Sig Start Date End Date Taking? Authorizing Provider  amLODipine (NORVASC) 5 MG tablet Take 1 tablet (5 mg total) by mouth daily. 07/21/19   Martinique, Peter M, MD  atenolol (TENORMIN) 25 MG tablet Take 25 mg by mouth daily.    [provider]  atorvastatin (LIPITOR) 80 MG tablet Take 1 tablet (80 mg total) by mouth every morning. 07/21/19   Martinique, Peter M, MD  clopidogrel (PLAVIX) 75 MG tablet TAKE 1 TABLET BY MOUTH EVERY DAY Patient taking differently: Take 75 mg by mouth daily. 01/25/20   Martinique, Peter M, MD  methocarbamol (ROBAXIN-750) 750 MG tablet Take 1 tablet (750 mg total) by mouth every 8 (eight) hours as needed for muscle spasms. 07/31/20   Charlesetta Shanks, MD  Multiple Vitamin (MULTIVITAMIN WITH MINERALS) TABS tablet Take 1 tablet by mouth daily.    [provider]  naproxen (NAPROSYN) 500 MG tablet Take 1 tablet (500 mg total) by mouth 2 (two) times daily as needed. Patient not taking: Reported on 07/31/2020 02/17/19   Augusto Gamble B, NP  naproxen (NAPROSYN) 500 MG tablet Take 1 tablet (500 mg total) by mouth 2 (two) times daily. 07/31/20    Charlesetta Shanks, MD  olmesartan-hydrochlorothiazide (BENICAR HCT) 20-12.5 MG tablet TAKE 1 TABLET BY MOUTH EVERY DAY Patient not taking: Reported on 07/31/2020 01/25/20   Martinique, Peter M, MD  olmesartan-hydrochlorothiazide (BENICAR HCT) 40-25 MG tablet Take 1 tablet by mouth daily.    [provider]  oxyCODONE-acetaminophen (PERCOCET) 5-325 MG tablet Take 1-2 tablets by mouth every 6 (six) hours as needed. 07/31/20   Charlesetta Shanks, MD      Allergies    Patient has no known allergies.    Review of Systems   Review of Systems  All other systems reviewed and are negative. Ten systems reviewed and are negative for acute change, except as noted in the HPI.   Physical Exam Updated Vital Signs BP (!) 153/98   Pulse (!) 131   Temp 100.2 F (37.9 C) (Oral)   Resp 20   SpO2 (!) 89%  Physical Exam Vitals and nursing note reviewed.  Constitutional:      General: He is not in acute distress.    Appearance: Normal appearance. He is not ill-appearing, toxic-appearing or diaphoretic.  HENT:     Head: Normocephalic and atraumatic.     Right Ear: External ear normal.     Left Ear: External ear normal.     Nose: Nose normal.     Mouth/Throat:     Mouth: Mucous membranes are moist.     Pharynx: Oropharynx  is clear. No oropharyngeal exudate or posterior oropharyngeal erythema.  Eyes:     Extraocular Movements: Extraocular movements intact.  Cardiovascular:     Rate and Rhythm: Normal rate and regular rhythm.     Pulses: Normal pulses.     Heart sounds: Normal heart sounds. No murmur heard.   No friction rub. No gallop.  Pulmonary:     Effort: Pulmonary effort is normal. No respiratory distress.     Breath sounds: No stridor. Examination of the right-middle field reveals decreased breath sounds. Examination of the right-lower field reveals decreased breath sounds. Decreased breath sounds present. No wheezing, rhonchi or rales.  Chest:     Chest wall: Tenderness present.      Comments: Surgical incision appears to be well-healing.  Please see image below. Abdominal:     General: Abdomen is flat.     Tenderness: There is no abdominal tenderness.  Musculoskeletal:        General: Normal range of motion.     Cervical back: Normal range of motion and neck supple. No tenderness.     Right lower leg: No tenderness. No edema.     Left lower leg: Tenderness present. Edema present.     Comments: Please see image below of the left leg.  Graft site along the left calf appears erythematous and is warm to the touch.  Palpable tenderness in the region with surrounding edema in the left lower leg.  Skin:    General: Skin is warm and dry.  Neurological:     General: No focal deficit present.     Mental Status: He is alert and oriented to person, place, and time.  Psychiatric:        Mood and Affect: Mood normal.        Behavior: Behavior normal.      ED Results / Procedures / Treatments   Labs (all labs ordered are listed, but only abnormal results are displayed) Labs Reviewed  BASIC METABOLIC PANEL - Abnormal; Notable for the following components:      Result Value   Glucose, Bld 101 (*)    Creatinine, Ser 1.31 (*)    Calcium 8.6 (*)    All other components within normal limits  CBC - Abnormal; Notable for the following components:   RBC 3.92 (*)    Hemoglobin 10.6 (*)    HCT 33.1 (*)    All other components within normal limits  URINALYSIS, ROUTINE W REFLEX MICROSCOPIC - Abnormal; Notable for the following components:   Ketones, ur 20 (*)    All other components within normal limits  HEPATIC FUNCTION PANEL - Abnormal; Notable for the following components:   Albumin 2.9 (*)    AST 65 (*)    ALT 57 (*)    Alkaline Phosphatase 165 (*)    All other components within normal limits  TROPONIN I (HIGH SENSITIVITY) - Abnormal; Notable for the following components:   Troponin I (High Sensitivity) 126 (*)    All other components within normal limits  SARS  CORONAVIRUS 2 BY RT PCR  CULTURE, BLOOD (ROUTINE X 2)  CULTURE, BLOOD (ROUTINE X 2)  URINE CULTURE  LACTIC ACID, PLASMA  PROTIME-INR  APTT  LACTIC ACID, PLASMA  TROPONIN I (HIGH SENSITIVITY)   EKG EKG Interpretation  Date/Time:  Monday July 16 2021 20:05:35 EDT Ventricular Rate:  89 PR Interval:  148 QRS Duration: 82 QT Interval:  382 QTC Calculation: 464 R Axis:   5 Text Interpretation: Normal sinus  rhythm Inferior infarct , age undetermined Abnormal ECG When compared with ECG of 20-Dec-2018 22:37, PREVIOUS ECG IS PRESENT Confirmed by Campbell Stall ([id number]) on 8/0/0349 9:02:19 PM  Radiology DG Tibia/Fibula Left  Result Date: 07/16/2021 CLINICAL DATA:  Apparent soft tissue infection in the left calf, initial encounter EXAM: LEFT TIBIA AND FIBULA - 2 VIEW COMPARISON:  None Available. FINDINGS: Mild degenerative changes are noted about the knee joint. No erosive changes are seen. Postsurgical changes consistent with prior vein harvest are noted. Mild soft tissue swelling is seen. IMPRESSION: Mild soft tissue swelling without acute bony abnormality. Electronically Signed   By: Inez Catalina M.D.   On: 07/16/2021 21:17   DG Chest Portable 1 View  Result Date: 07/16/2021 CLINICAL DATA:  Shortness of breath, history of recent cardiac bypass grafting EXAM: PORTABLE CHEST 1 VIEW COMPARISON:  07/31/2020 FINDINGS: Cardiac shadow remains enlarged. Postsurgical changes are now seen. Left lung is clear. Moderate right-sided pleural effusion is noted. No acute bony abnormality is seen. IMPRESSION: Moderate right-sided pleural effusion with likely underlying atelectasis. Electronically Signed   By: Inez Catalina M.D.   On: 07/16/2021 21:15    Procedures Procedures   Medications Ordered in ED Medications  acetaminophen (TYLENOL) tablet 650 mg (650 mg Oral Given 07/16/21 2125)  vancomycin (VANCOCIN) IVPB 1000 mg/200 mL premix (has no administration in time range)  ceFEPIme (MAXIPIME) 2 g in sodium  chloride 0.9 % 100 mL IVPB (has no administration in time range)  morphine (PF) 2 MG/ML injection 2 mg (2 mg Intravenous Given 07/16/21 2124)    ED Course/ Medical Decision Making/ A&P Clinical Course as of 07/16/21 2236  Mon Jul 16, 2021  2210 Patient discussed with Dr. Lunette Stands with cardiothoracic surgery at [place].  We discussed patient's initial lab work, physical exam findings, as well as chest x-ray.  Agrees with admission.  Patient will be transferred and admitted to [facility] health. [LJ]    Clinical Course User Index [LJ] Rayna Sexton, PA-C                           Medical Decision Making Amount and/or Complexity of Data Reviewed Labs: ordered. Radiology: ordered.  Risk OTC drugs. Prescription drug management.   Pt is a 56 y.o. male who underwent quadruple bypass at [facility] and was discharged yesterday.  Experiencing progressively worsening shortness of breath today.  Labs: CBC with a hemoglobin of 10.6. BMP with a glucose of 101, creatinine of 1.31, calcium of 8.6. Troponin of 126. Prothrombin time of 13.2 with an INR 1. APTT of 36. UA with 20 ketones.  Per records, chest x-ray on June 3 shows:  Supportive devices: None  Cardiovascular/lungs/pleura: Postoperative changes with stable enlarged appearance of cardia mediastinal silhouette. Improved aeration in bilateral lower lung with residual atelectasis. Possible small right pleural effusion. No discernible pneumothorax.  Other: Median sternotomy status.   Note: Portions of this report may have been transcribed using voice recognition software. Every effort was made to ensure accuracy; however, inadvertent computerized transcription errors may be present.   Chest x-ray obtained today shows:  IMPRESSION: Moderate right-sided pleural effusion with likely underlying atelectasis.  Physical exam significant for low-grade fever of 100.2 F.  Nontachycardic.  Oxygen  saturations at 95% on room air.  Decreased breath sounds noted on the right.  Heart is regular rate and rhythm without murmurs, rubs, or gallops.  Please see images above of the  recent surgical site on the patient's left leg.  Edema in the region with erythema and increased warmth.  Possible developing cellulitis.  Chest x-ray also concerning for worsening right-sided pleural effusion.  We will give patient a dose of vancomycin as well as cefepime.   Patient discussed with Dr. Lunette Stands who is on-call with cardiothoracic surgery at [facility].  Agrees with transfer and admission to [facility].  This was discussed with the patient as well as his wife at bedside who are agreeable with this plan.  Final Clinical Impression(s) / ED Diagnoses Final diagnoses:  Pleural effusion  Cellulitis of left lower extremity   Rx / DC Orders ED Discharge Orders     None        Rayna Sexton, PA-C 07/16/21 2229    Rayna Sexton, PA-C 07/16/21 2237    Rayna Sexton, PA-C 32/35/57 3220    Campbell Stall P, DO 25/42/70 1306

## 2021-07-16 NOTE — ED Notes (Signed)
[facility] arrived for transport 

## 2021-07-16 NOTE — ED Triage Notes (Signed)
Pt BIB EMS from home due to sob that started this morning. Pt was 95% on RA at home.

## 2021-07-17 DIAGNOSIS — Z7902 Long term (current) use of antithrombotics/antiplatelets: Secondary | ICD-10-CM | POA: Diagnosis not present

## 2021-07-17 DIAGNOSIS — I2510 Atherosclerotic heart disease of native coronary artery without angina pectoris: Secondary | ICD-10-CM | POA: Diagnosis not present

## 2021-07-17 DIAGNOSIS — R824 Acetonuria: Secondary | ICD-10-CM | POA: Diagnosis not present

## 2021-07-17 DIAGNOSIS — R0602 Shortness of breath: Secondary | ICD-10-CM | POA: Diagnosis present

## 2021-07-17 DIAGNOSIS — Z79899 Other long term (current) drug therapy: Secondary | ICD-10-CM | POA: Diagnosis not present

## 2021-07-17 DIAGNOSIS — I10 Essential (primary) hypertension: Secondary | ICD-10-CM | POA: Diagnosis not present

## 2021-07-17 DIAGNOSIS — Z20822 Contact with and (suspected) exposure to covid-19: Secondary | ICD-10-CM | POA: Diagnosis not present

## 2021-07-17 DIAGNOSIS — J90 Pleural effusion, not elsewhere classified: Secondary | ICD-10-CM | POA: Diagnosis not present

## 2021-07-17 DIAGNOSIS — L03116 Cellulitis of left lower limb: Secondary | ICD-10-CM | POA: Diagnosis not present

## 2021-07-17 LAB — URINE CULTURE: Culture: NO GROWTH

## 2021-07-21 LAB — CULTURE, BLOOD (ROUTINE X 2)
Culture: NO GROWTH
Culture: NO GROWTH

## 2021-07-26 ENCOUNTER — Telehealth (HOSPITAL_COMMUNITY): Payer: Self-pay

## 2021-07-26 ENCOUNTER — Encounter (HOSPITAL_COMMUNITY): Payer: Self-pay

## 2021-07-26 NOTE — Telephone Encounter (Signed)
Received outside referral from [facility] for pt to participate in the cardiac rehab program. Attempted to call pt in regards to cardiac rehab, left voicemail for pt to call back. Mailed letter.

## 2021-07-26 NOTE — Telephone Encounter (Signed)
Called patient to see if he is interested in the Cardiac Rehab Program. Patient expressed interest. Explained scheduling process and went over insurance, patient verbalized understanding. Will contact patient for scheduling after he is reviewed by the nurse navigator.

## 2021-07-26 NOTE — Telephone Encounter (Signed)
Outside/paper referral received by [facility] for cardiac rehab. Will fax over Physician order and request further documents. Insurance benefits and eligibility to be determined.

## 2021-08-02 ENCOUNTER — Telehealth (HOSPITAL_COMMUNITY): Payer: Self-pay | Admitting: *Deleted

## 2021-09-18 ENCOUNTER — Telehealth (HOSPITAL_COMMUNITY): Payer: Self-pay

## 2021-10-12 ENCOUNTER — Telehealth (HOSPITAL_COMMUNITY): Payer: Self-pay

## 2021-10-16 ENCOUNTER — Encounter (HOSPITAL_COMMUNITY)
Admission: RE | Admit: 2021-10-16 | Discharge: 2021-10-16 | Disposition: A | Discharge status: Home / Self Care | Payer: No Typology Code available for payment source | Source: Ambulatory Visit | Attending: Cardiology | Admitting: Cardiology

## 2021-10-16 ENCOUNTER — Encounter (HOSPITAL_COMMUNITY): Payer: Self-pay

## 2021-10-16 VITALS — BP 122/84 | HR 52 | Ht 66.5 in | Wt 206.6 lb

## 2021-10-16 DIAGNOSIS — Z951 Presence of aortocoronary bypass graft: Secondary | ICD-10-CM | POA: Insufficient documentation

## 2021-10-16 NOTE — Progress Notes (Signed)
Patient noted to have sinus brady rate as low as 47. Patient asymptomatic. Blood pressure stable. The [facility] was called and notified. Spoke with Claiborne Billings who spoke with the patient over the phone. Will fax today's ECG tracings to the [facility] for review. office for review.Harrell Gave RN BSN

## 2021-10-16 NOTE — Progress Notes (Signed)
Cardiac Rehab Medication Review by a Nurse  Does the patient  feel that his/her medications are working for him/her?  YES   Has the patient been experiencing any side effects to the medications prescribed?  YES   Does the patient measure his/her own blood pressure or blood glucose at home?  YES   Does the patient have any problems obtaining medications due to transportation or finances?    NO  Understanding of regimen: good Understanding of indications: good Potential of compliance: good    Nurse  comments: Nathan Barrett decreased his metoprolol to once a day as he said his heart rate was too slow. The [facility] was notified. Nathan Barrett says he checks his BP daily.   Nathan Barrett Nathan Mourer RN 10/16/2021 10:01 AM

## 2021-10-16 NOTE — Progress Notes (Signed)
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 Swallow whole., Disp: , Rfl:    atorvastatin (LIPITOR) 80 MG tablet, Take 1 tablet (80 mg total) by mouth every morning. (Patient taking differently: Take 40 mg by mouth every morning.), Disp: 90 tablet, Rfl: 1   Cholecalciferol (VITAMIN D3) 125 MCG (5000 UT) TABS, Take 5,000 Units by mouth daily., Disp: , Rfl:    clopidogrel (PLAVIX) 75 MG tablet, TAKE 1 TABLET BY MOUTH EVERY DAY (Patient taking differently: Take 75 mg by mouth daily.), Disp: 90 tablet, Rfl: 1   diclofenac Sodium (VOLTAREN) 1 % GEL, Apply 2 g topically daily as needed (pIN)., Disp: , Rfl:    losartan-hydrochlorothiazide (HYZAAR) 100-25 MG tablet, Take 1 tablet by mouth daily., Disp: , Rfl:    magnesium oxide (MAG-OX) 400 (240 Mg) MG tablet, Take 400 mg by mouth 3 (three) times a week., Disp: , Rfl:    metoprolol tartrate (LOPRESSOR) 25 MG tablet, Take 25 mg by mouth 2 (two) times daily., Disp: , Rfl:    Multiple Vitamin (MULTIVITAMIN WITH MINERALS) TABS tablet, Take 1 tablet by mouth daily. Centrum Silver  men's 50 +, Disp: , Rfl:    omeprazole (PRILOSEC) 40 MG capsule, Take 40 mg by mouth daily., Disp: , Rfl:    traZODone (DESYREL) 50 MG tablet, Take 100 mg by mouth at bedtime., Disp: , Rfl:   Past Medical History: Past Medical History:  Diagnosis Date   Aortic stenosis    Mild   Bradycardia    CKD (chronic kidney disease), stage II    Coronary artery disease    a. multivessel, treated medically.   H. pylori infection    Hypercholesterolemia    Hypertension     Tobacco Use: Social History   Tobacco Use  Smoking Status Never  Smokeless Tobacco Never    Labs: Review Flowsheet  More data exists      Latest Ref Rng & Units 03/15/2012 03/30/2015 07/12/2016 10/22/2018 07/31/2020  Labs for ITP Cardiac and Pulmonary Rehab  Cholestrol 100 - 199 mg/dL - 137  120  126  -  LDL (calc) 0 - 99 mg/dL - 84  66  73  -  HDL-C >39 mg/dL - 37  40  35  -  Trlycerides 0 - 149 mg/dL - 78  70  97  -  TCO2 22 - 32 mmol/L 27  - - - 24     Capillary Blood Glucose: Lab Results  Component Value Date   GLUCAP 116 (H) 12/20/2018   GLUCAP 124 (H) 03/15/2012  Exercise Target Goals: Exercise Program Goal: Individual exercise prescription set using results from initial 6 min walk test and THRR while considering  patient's activity barriers and safety.   Exercise Prescription Goal: Initial exercise prescription builds to 30-45 minutes a day of aerobic activity, 2-3 days per week.  Home exercise guidelines will be given to patient during program as part of exercise prescription that the participant will acknowledge.  Activity Barriers & Risk Stratification:  Activity Barriers & Cardiac Risk Stratification - 10/16/21 0938       Activity Barriers & Cardiac Risk Stratification   Activity Barriers Other (comment)    Comments Needs left shoulder surgery. Bilateral knee pain, gets gel injections every 6 months, needs knee replacement.    Cardiac Risk Stratification Moderate             6 Minute  Walk:  6 Minute Walk     Row Name 10/16/21 0927         6 Minute Walk   Phase Initial     Distance 1675 feet     Walk Time 6 minutes     # of Rest Breaks 0     MPH 3.17     METS 3.63     RPE 11     Perceived Dyspnea  1     VO2 Peak 12.69     Symptoms Yes (comment)     Comments Mild SOB, RPD=1     Resting HR 52 bpm     Resting BP 122/84     Resting Oxygen Saturation  97 %     Exercise Oxygen Saturation  during 6 min walk 100 %     Max Ex. HR 73 bpm     Max Ex. BP 120/80     2 Minute Post BP 112/80              Oxygen Initial Assessment:   Oxygen Re-Evaluation:   Oxygen Discharge (Final Oxygen Re-Evaluation):   Initial Exercise Prescription:  Initial Exercise Prescription - 10/16/21 1000       Date of Initial Exercise RX and Referring Provider   Date 10/16/21    Referring Provider Filbert Berthold, MD   Sueanne Margarita, MD (covering)   Expected Discharge Date 12/14/21      Bike   Level 2.5    Minutes 15    METs 3.6      NuStep   Level 3    SPM 85    Minutes 15    METs 3      Prescription Details   Frequency (times per week) 3    Duration Progress to 30 minutes of continuous aerobic without signs/symptoms of physical distress      Intensity   THRR 40-80% of Max Heartrate 65-131    Ratings of Perceived Exertion 11-13    Perceived Dyspnea 0-4      Progression   Progression Continue to progress workloads to maintain intensity without signs/symptoms of physical distress.      Resistance Training   Training Prescription Yes    Weight 5 lbs    Reps 10-15             Perform Capillary Blood Glucose checks as needed.  Exercise Prescription Changes:   Exercise Comments:   Exercise Goals and Review:   Exercise Goals     Row Name 10/16/21 0913             Exercise Goals   Increase Physical Activity  Yes       Intervention Provide advice, education, support and counseling about physical activity/exercise needs.;Develop an  individualized exercise prescription for aerobic and resistive training based on initial evaluation findings, risk stratification, comorbidities and participant's personal goals.       Expected Outcomes Short Term: Attend rehab on a regular basis to increase amount of physical activity.;Long Term: Exercising regularly at least 3-5 days a week.;Long Term: Add in home exercise to make exercise part of routine and to increase amount of physical activity.       Increase Strength and Stamina Yes       Intervention Provide advice, education, support and counseling about physical activity/exercise needs.;Develop an individualized exercise prescription for aerobic and resistive training based on initial evaluation findings, risk stratification, comorbidities and participant's personal goals.       Expected Outcomes Short Term: Increase workloads from initial exercise prescription for resistance, speed, and METs.;Short Term: Perform resistance training exercises routinely during rehab and add in resistance training at home;Long Term: Improve cardiorespiratory fitness, muscular endurance and strength as measured by increased METs and functional capacity (6MWT)       Able to understand and use rate of perceived exertion (RPE) scale Yes       Intervention Provide education and explanation on how to use RPE scale       Expected Outcomes Short Term: Able to use RPE daily in rehab to express subjective intensity level;Long Term:  Able to use RPE to guide intensity level when exercising independently       Knowledge and understanding of Target Heart Rate Range (THRR) Yes       Intervention Provide education and explanation of THRR including how the numbers were predicted and where they are located for reference       Expected Outcomes Short Term: Able to state/look up THRR;Long Term: Able to use THRR to govern intensity when exercising independently;Short Term: Able to use daily as guideline for intensity in rehab        Able to check pulse independently Yes       Intervention Provide education and demonstration on how to check pulse in carotid and radial arteries.;Review the importance of being able to check your own pulse for safety during independent exercise       Expected Outcomes Short Term: Able to explain why pulse checking is important during independent exercise;Long Term: Able to check pulse independently and accurately       Understanding of Exercise Prescription Yes       Intervention Provide education, explanation, and written materials on patient's individual exercise prescription       Expected Outcomes Short Term: Able to explain program exercise prescription;Long Term: Able to explain home exercise prescription to exercise independently                Exercise Goals Re-Evaluation :   Discharge Exercise Prescription (Final Exercise Prescription Changes):   Nutrition:  Target Goals: Understanding of nutrition guidelines, daily intake of sodium '1500mg'$ , cholesterol '200mg'$ , calories 30% from fat and 7% or less from saturated fats, daily to have 5 or more servings of fruits and vegetables.  Biometrics:  Pre Biometrics - 10/16/21 0857       Pre Biometrics   Waist Circumference 44 inches    Hip Circumference 41.75 inches    Waist to Hip Ratio 1.05 %    Triceps Skinfold 21 mm    % Body Fat 32.6 %    Grip Strength 44 kg  Flexibility 10.75 in    Single Leg Stand 17.12 seconds              Nutrition Therapy Plan and Nutrition Goals:   Nutrition Assessments:  MEDIFICTS Score Key: ?70 Need to make dietary changes  40-70 Heart Healthy Diet ? 40 Therapeutic Level Cholesterol Diet    Picture Your Plate Scores: <66 Unhealthy dietary pattern with much room for improvement. 41-50 Dietary pattern unlikely to meet recommendations for good health and room for improvement. 51-60 More healthful dietary pattern, with some room for improvement.  >60 Healthy dietary pattern,  although there may be some specific behaviors that could be improved.    Nutrition Goals Re-Evaluation:   Nutrition Goals Re-Evaluation:   Nutrition Goals Discharge (Final Nutrition Goals Re-Evaluation):   Psychosocial: Target Goals: Acknowledge presence or absence of significant depression and/or stress, maximize coping skills, provide positive support system. Participant is able to verbalize types and ability to use techniques and skills needed for reducing stress and depression.  Initial Review & Psychosocial Screening:  Initial Psych Review & Screening - 10/16/21 1202       Initial Review   Current issues with Current Depression;History of Depression;Current Sleep Concerns;Current Stress Concerns    Source of Stress Concerns Chronic Illness;Poor Coping Skills    Comments Cristan admits he is depressed. he is reciving coun selling from the [facility]? 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 Ariez receives therapy every other week which he finds helpful.      Barriers   Psychosocial barriers to participate in program The patient should benefit from training in stress management and relaxation.      Screening Interventions   Interventions Encouraged to exercise;Provide feedback about the scores to participant;To provide support and resources with identified psychosocial needs    Expected Outcomes Long Term Goal: Stressors or current issues are controlled or eliminated.;Short Term goal: Identification and review with participant of any Quality of Life or Depression concerns found by scoring the questionnaire.;Long Term goal: The participant improves quality of Life and PHQ9 Scores as seen by post scores and/or verbalization of changes             Quality of Life Scores:  Quality of Life - 10/16/21 1102       Quality of Life   Select Quality of Life      Quality  of Life Scores   Health/Function Pre 13.93 %    Socioeconomic Pre 19.58 %    Psych/Spiritual Pre 16.64 %    Family Pre 22.8 %    GLOBAL Pre 16.88 %            Scores of 19 and below usually indicate a poorer quality of life in these areas.  A difference of  2-3 points is a clinically meaningful difference.  A difference of 2-3 points in the total score of the Quality of Life Index has been associated with significant improvement in overall quality of life, self-image, physical symptoms, and general health in studies assessing change in quality of life.  PHQ-9: Review Flowsheet       10/16/2021 09/05/2014 05/18/2014  Depression screen PHQ 2/9  Decreased Interest 3 0 0  Down, Depressed, Hopeless 2 0 0  PHQ - 2 Score 5 0 0  Altered sleeping 3 - -  Tired, decreased energy  1 - -  Change in appetite 0 - -  Feeling bad or failure about yourself  2 - -  Trouble concentrating 2 - -  Moving slowly or fidgety/restless 0 - -  Suicidal thoughts 0 - -  PHQ-9 Score 13 - -  Difficult doing work/chores Very difficult - -   Interpretation of Total Score  Total Score Depression Severity:  1-4 = Minimal depression, 5-9 = Mild depression, 10-14 = Moderate depression, 15-19 = Moderately severe depression, 20-27 = Severe depression   Psychosocial Evaluation and Intervention:   Psychosocial Re-Evaluation:   Psychosocial Discharge (Final Psychosocial Re-Evaluation):   Vocational Rehabilitation: Provide vocational rehab assistance to qualifying candidates.   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            Education: Education Goals: Education classes will be provided on a weekly basis, covering required topics. Participant will state understanding/return  demonstration of topics presented.     Core Videos: Exercise    Move It!  Clinical staff conducted group or individual video education with verbal and written material and guidebook.  Patient learns the recommended Pritikin exercise program. Exercise with the goal of living a long, healthy life. Some of the health benefits of exercise include controlled diabetes, healthier blood pressure levels, improved cholesterol levels, improved heart and lung capacity, improved sleep, and better body composition. Everyone should speak with their doctor before starting or changing an exercise routine.  Biomechanical Limitations Clinical staff conducted group or individual video education with verbal and written material and guidebook.  Patient learns how biomechanical limitations can impact exercise and how we can mitigate and possibly overcome limitations to have an impactful and balanced exercise routine.  Body Composition Clinical staff conducted group or individual video education with verbal and written material and guidebook.  Patient learns that body composition (ratio of muscle mass to fat mass) is a key component to assessing overall fitness, rather than body weight alone. Increased fat mass, especially visceral belly fat, can put [place] at increased risk for metabolic syndrome, type 2 diabetes, heart disease, and even death. It is recommended to combine diet and exercise (cardiovascular and resistance training) to improve your body composition. Seek guidance from your physician and exercise physiologist before implementing an exercise routine.  Exercise Action Plan Clinical staff conducted group or individual video education with verbal and written material and guidebook.  Patient learns the recommended strategies to achieve and enjoy long-term exercise adherence, including variety, self-motivation, self-efficacy, and positive decision making. Benefits of exercise include fitness, good health, weight  management, more energy, better sleep, less stress, and overall well-being.  Medical   Heart Disease Risk Reduction Clinical staff conducted group or individual video education with verbal and written material and guidebook.  Patient learns our heart is our most vital organ as it circulates oxygen, nutrients, white blood cells, and hormones throughout the entire body, and carries waste away. Data supports a plant-based eating plan like the Pritikin Program for its effectiveness in slowing progression of and reversing heart disease. The video provides a number of recommendations to address heart disease.   Metabolic Syndrome and Belly Fat  Clinical staff conducted group or individual video education with verbal and written material and guidebook.  Patient learns what metabolic  syndrome is, how it leads to heart disease, and how one can reverse it and keep it from coming back. You have metabolic syndrome if you have 3 of the following 5 criteria: abdominal obesity, high blood pressure, high triglycerides, low HDL cholesterol, and high blood sugar.  Hypertension and Heart Disease Clinical staff conducted group or individual video education with verbal and written material and guidebook.  Patient learns that high blood pressure, or hypertension, is very common in the [place]. Hypertension is largely due to excessive salt intake, but other important risk factors include being overweight, physical inactivity, drinking too much alcohol, smoking, and not eating enough potassium from fruits and vegetables. High blood pressure is a leading risk factor for heart attack, stroke, congestive heart failure, dementia, kidney failure, and premature death. Long-term effects of excessive salt intake include stiffening of the arteries and thickening of heart muscle and organ damage. Recommendations include ways to reduce hypertension and the risk of heart disease.  Diseases of Our Time - Focusing on  Diabetes Clinical staff conducted group or individual video education with verbal and written material and guidebook.  Patient learns why the best way to stop diseases of our time is prevention, through food and other lifestyle changes. Medicine (such as prescription pills and surgeries) is often only a Band-Aid on the problem, not a long-term solution. Most common diseases of our time include obesity, type 2 diabetes, hypertension, heart disease, and cancer. The Pritikin Program is recommended and has been proven to help reduce, reverse, and/or prevent the damaging effects of metabolic syndrome.  Nutrition   Overview of the Pritikin Eating Plan  Clinical staff conducted group or individual video education with verbal and written material and guidebook.  Patient learns about the [place] for disease risk reduction. The [place] emphasizes a wide variety of unrefined, minimally-processed carbohydrates, like fruits, vegetables, whole grains, and legumes. Go, Caution, and Stop food choices are explained. Plant-based and lean animal proteins are emphasized. Rationale provided for low sodium intake for blood pressure control, low added sugars for blood sugar stabilization, and low added fats and oils for coronary artery disease risk reduction and weight management.  Calorie Density  Clinical staff conducted group or individual video education with verbal and written material and guidebook.  Patient learns about calorie density and how it impacts the Pritikin Eating Plan. Knowing the characteristics of the food you choose will help you decide whether those foods will lead to weight gain or weight loss, and whether you want to consume more or less of them. Weight loss is usually a side effect of the Pritikin Eating Plan because of its focus on low calorie-dense foods.  Label Reading  Clinical staff conducted group or individual video education with verbal and written material and  guidebook.  Patient learns about the Pritikin recommended label reading guidelines and corresponding recommendations regarding calorie density, added sugars, sodium content, and whole grains.  Dining Out - Part 1  Clinical staff conducted group or individual video education with verbal and written material and guidebook.  Patient learns that restaurant meals can be sabotaging because they can be so high in calories, fat, sodium, and/or sugar. Patient learns recommended strategies on how to positively address this and avoid unhealthy pitfalls.  Facts on Fats  Clinical staff conducted group or individual video education with verbal and written material and guidebook.  Patient learns that lifestyle modifications can be just as effective, if not more so, as many medications for lowering  your risk of heart disease. A Pritikin lifestyle can help to reduce your risk of inflammation and atherosclerosis (cholesterol build-up, or plaque, in the artery walls). Lifestyle interventions such as dietary choices and physical activity address the cause of atherosclerosis. A review of the types of fats and their impact on blood cholesterol levels, along with dietary recommendations to reduce fat intake is also included.  Nutrition Action Plan  Clinical staff conducted group or individual video education with verbal and written material and guidebook.  Patient learns how to incorporate Pritikin recommendations into their lifestyle. Recommendations include planning and keeping personal health goals in mind as an important part of their success.  Healthy Mind-Set    Healthy Minds, Bodies, Hearts  Clinical staff conducted group or individual video education with verbal and written material and guidebook.  Patient learns how to identify when they are stressed. Video will discuss the impact of that stress, as well as the many benefits of stress management. Patient will also be introduced to stress management techniques.  The way we think, act, and feel has an impact on our hearts.  How Our Thoughts Can Heal Our Hearts  Clinical staff conducted group or individual video education with verbal and written material and guidebook.  Patient learns that negative thoughts can cause depression and anxiety. This can result in negative lifestyle behavior and serious health problems. Cognitive behavioral therapy is an effective method to help control our thoughts in order to change and improve our emotional outlook.  Additional Videos:  Exercise    Improving Performance  Clinical staff conducted group or individual video education with verbal and written material and guidebook.  Patient learns to use a non-linear approach by alternating intensity levels and lengths of time spent exercising to help burn more calories and lose more body fat. Cardiovascular exercise helps improve heart health, metabolism, hormonal balance, blood sugar control, and recovery from fatigue. Resistance training improves strength, endurance, balance, coordination, reaction time, metabolism, and muscle mass. Flexibility exercise improves circulation, posture, and balance. Seek guidance from your physician and exercise physiologist before implementing an exercise routine and learn your capabilities and proper form for all exercise.  Introduction to Yoga  Clinical staff conducted group or individual video education with verbal and written material and guidebook.  Patient learns about yoga, a discipline of the coming together of mind, breath, and body. The benefits of yoga include improved flexibility, improved range of motion, better posture and core strength, increased lung function, weight loss, and positive self-image. Yoga's heart health benefits include lowered blood pressure, healthier heart rate, decreased cholesterol and triglyceride levels, improved immune function, and reduced stress. Seek guidance from your physician and exercise physiologist  before implementing an exercise routine and learn your capabilities and proper form for all exercise.  Medical   Aging: Enhancing Your Quality of Life  Clinical staff conducted group or individual video education with verbal and written material and guidebook.  Patient learns key strategies and recommendations to stay in good physical health and enhance quality of life, such as prevention strategies, having an advocate, securing a [place], and keeping a list of medications and system for tracking them. It also discusses how to avoid risk for bone loss.  Biology of Weight Control  Clinical staff conducted group or individual video education with verbal and written material and guidebook.  Patient learns that weight gain occurs because we consume more calories than we burn (eating more, moving less). Even if your body  weight is normal, you may have higher ratios of fat compared to muscle mass. Too much body fat puts you at increased risk for cardiovascular disease, heart attack, stroke, type 2 diabetes, and obesity-related cancers. In addition to exercise, following the [place] can help reduce your risk.  Decoding Lab Results  Clinical staff conducted group or individual video education with verbal and written material and guidebook.  Patient learns that lab test reflects one measurement whose values change over time and are influenced by many factors, including medication, stress, sleep, exercise, food, hydration, pre-existing medical conditions, and more. It is recommended to use the knowledge from this video to become more involved with your lab results and evaluate your numbers to speak with your doctor.   Diseases of Our Time - Overview  Clinical staff conducted group or individual video education with verbal and written material and guidebook.  Patient learns that according to the CDC, 50% to 70% of chronic diseases (such as obesity, type 2 diabetes,  elevated lipids, hypertension, and heart disease) are avoidable through lifestyle improvements including healthier food choices, listening to satiety cues, and increased physical activity.  Sleep Disorders Clinical staff conducted group or individual video education with verbal and written material and guidebook.  Patient learns how good quality and duration of sleep are important to overall health and well-being. Patient also learns about sleep disorders and how they impact health along with recommendations to address them, including discussing with a physician.  Nutrition  Dining Out - Part 2 Clinical staff conducted group or individual video education with verbal and written material and guidebook.  Patient learns how to plan ahead and communicate in order to maximize their dining experience in a healthy and nutritious manner. Included are recommended food choices based on the type of restaurant the patient is visiting.   Fueling a Best boy conducted group or individual video education with verbal and written material and guidebook.  There is a strong connection between our food choices and our health. Diseases like obesity and type 2 diabetes are very prevalent and are in large-part due to lifestyle choices. The Pritikin Eating Plan provides plenty of food and hunger-curbing satisfaction. It is easy to follow, affordable, and helps reduce health risks.  Menu Workshop  Clinical staff conducted group or individual video education with verbal and written material and guidebook.  Patient learns that restaurant meals can sabotage health goals because they are often packed with calories, fat, sodium, and sugar. Recommendations include strategies to plan ahead and to communicate with the manager, chef, or server to help order a healthier meal.  Planning Your Eating Strategy  Clinical staff conducted group or individual video education with verbal and written material and  guidebook.  Patient learns about the [place] and its benefit of reducing the risk of disease. The [place] does not focus on calories. Instead, it emphasizes high-quality, nutrient-rich foods. By knowing the characteristics of the foods, we choose, we can determine their calorie density and make informed decisions.  Targeting Your Nutrition Priorities  Clinical staff conducted group or individual video education with verbal and written material and guidebook.  Patient learns that lifestyle habits have a tremendous impact on disease risk and progression. This video provides eating and physical activity recommendations based on your personal health goals, such as reducing LDL cholesterol, losing weight, preventing or controlling type 2 diabetes, and reducing high blood pressure.  Vitamins and Minerals  Clinical staff conducted group or  individual video education with verbal and written material and guidebook.  Patient learns different ways to obtain key vitamins and minerals, including through a recommended healthy diet. It is important to discuss all supplements you take with your doctor.   Healthy Mind-Set    Smoking Cessation  Clinical staff conducted group or individual video education with verbal and written material and guidebook.  Patient learns that cigarette smoking and tobacco addiction pose a serious health risk which affects millions of people. Stopping smoking will significantly reduce the risk of heart disease, lung disease, and many forms of cancer. Recommended strategies for quitting are covered, including working with your doctor to develop a successful plan.  Culinary   Becoming a Financial trader conducted group or individual video education with verbal and written material and guidebook.  Patient learns that cooking at home can be healthy, cost-effective, quick, and puts them in control. Keys to cooking healthy recipes will include looking  at your recipe, assessing your equipment needs, planning ahead, making it simple, choosing cost-effective seasonal ingredients, and limiting the use of added fats, salts, and sugars.  Cooking - Breakfast and Snacks  Clinical staff conducted group or individual video education with verbal and written material and guidebook.  Patient learns how important breakfast is to satiety and nutrition through the entire day. Recommendations include key foods to eat during breakfast to help stabilize blood sugar levels and to prevent overeating at meals later in the day. Planning ahead is also a key component.  Cooking - Human resources officer conducted group or individual video education with verbal and written material and guidebook.  Patient learns eating strategies to improve overall health, including an approach to cook more at home. Recommendations include thinking of animal protein as a side on your plate rather than center stage and focusing instead on lower calorie dense options like vegetables, fruits, whole grains, and plant-based proteins, such as beans. Making sauces in large quantities to freeze for later and leaving the skin on your vegetables are also recommended to maximize your experience.  Cooking - Healthy Salads and Dressing Clinical staff conducted group or individual video education with verbal and written material and guidebook.  Patient learns that vegetables, fruits, whole grains, and legumes are the foundations of the [place]. Recommendations include how to incorporate each of these in flavorful and healthy salads, and how to create homemade salad dressings. Proper handling of ingredients is also covered. Cooking - Soups and [employer] - Soups and Desserts Clinical staff conducted group or individual video education with verbal and written material and guidebook.  Patient learns that Pritikin soups and desserts make for easy, nutritious, and delicious snacks  and meal components that are low in sodium, fat, sugar, and calorie density, while high in vitamins, minerals, and filling fiber. Recommendations include simple and healthy ideas for soups and desserts.   Overview     The Pritikin Solution Program Overview Clinical staff conducted group or individual video education with verbal and written material and guidebook.  Patient learns that the results of the [place] Program have been documented in more than 100 articles published in peer-reviewed journals, and the benefits include reducing risk factors for (and, in some cases, even reversing) high cholesterol, high blood pressure, type 2 diabetes, obesity, and more! An overview of the three key pillars of the Pritikin Program will be covered: eating well, doing regular exercise, and having a healthy mind-set.  WORKSHOPS  Exercise: Exercise  Basics: Building Your Action Plan Clinical staff led group instruction and group discussion with PowerPoint presentation and patient guidebook. To enhance the learning environment the use of posters, models and videos may be added. At the conclusion of this workshop, patients will comprehend the difference between physical activity and exercise, as well as the benefits of incorporating both, into their routine. Patients will understand the FITT (Frequency, Intensity, Time, and Type) principle and how to use it to build an exercise action plan. In addition, safety concerns and other considerations for exercise and cardiac rehab will be addressed by the presenter. The purpose of this lesson is to promote a comprehensive and effective weekly exercise routine in order to improve patients' overall level of fitness.   Managing Heart Disease: Your Path to a Healthier Heart Clinical staff led group instruction and group discussion with PowerPoint presentation and patient guidebook. To enhance the learning environment the use of posters, models and videos may be added.At the  conclusion of this workshop, patients will understand the anatomy and physiology of the heart. Additionally, they will understand how Pritikin's three pillars impact the risk factors, the progression, and the management of heart disease.  The purpose of this lesson is to provide a high-level overview of the heart, heart disease, and how the Pritikin lifestyle positively impacts risk factors.  Exercise Biomechanics Clinical staff led group instruction and group discussion with PowerPoint presentation and patient guidebook. To enhance the learning environment the use of posters, models and videos may be added. Patients will learn how the structural parts of their bodies function and how these functions impact their daily activities, movement, and exercise. Patients will learn how to promote a neutral spine, learn how to manage pain, and identify ways to improve their physical movement in order to promote healthy living. The purpose of this lesson is to expose patients to common physical limitations that impact physical activity. Participants will learn practical ways to adapt and manage aches and pains, and to minimize their effect on regular exercise. Patients will learn how to maintain good posture while sitting, walking, and lifting.  Balance Training and Fall Prevention  Clinical staff led group instruction and group discussion with PowerPoint presentation and patient guidebook. To enhance the learning environment the use of posters, models and videos may be added. At the conclusion of this workshop, patients will understand the importance of their sensorimotor skills (vision, proprioception, and the vestibular system) in maintaining their ability to balance as they age. Patients will apply a variety of balancing exercises that are appropriate for their current level of function. Patients will understand the common causes for poor balance, possible solutions to these problems, and ways to  modify their physical environment in order to minimize their fall risk. The purpose of this lesson is to teach patients about the importance of maintaining balance as they age and ways to minimize their risk of falling.  WORKSHOPS   Nutrition:  Fueling a Scientist, research (physical sciences) led group instruction and group discussion with PowerPoint presentation and patient guidebook. To enhance the learning environment the use of posters, models and videos may be added. Patients will review the foundational principles of the [place] and understand what constitutes a serving size in each of the food groups. Patients will also learn Pritikin-friendly foods that are better choices when away from home and review make-ahead meal and snack options. Calorie density will be reviewed and applied to three nutrition priorities: weight maintenance, weight loss, and weight gain. The  purpose of this lesson is to reinforce (in a group setting) the key concepts around what patients are recommended to eat and how to apply these guidelines when away from home by planning and selecting Pritikin-friendly options. Patients will understand how calorie density may be adjusted for different weight management goals.  Mindful Eating  Clinical staff led group instruction and group discussion with PowerPoint presentation and patient guidebook. To enhance the learning environment the use of posters, models and videos may be added. Patients will briefly review the concepts of the [place] and the importance of low-calorie dense foods. The concept of mindful eating will be introduced as well as the importance of paying attention to internal hunger signals. Triggers for non-hunger eating and techniques for dealing with triggers will be explored. The purpose of this lesson is to provide patients with the opportunity to review the basic principles of the [place], discuss the value of eating mindfully and how to  measure internal cues of hunger and fullness using the Hunger Scale. Patients will also discuss reasons for non-hunger eating and learn strategies to use for controlling emotional eating.  Targeting Your Nutrition Priorities Clinical staff led group instruction and group discussion with PowerPoint presentation and patient guidebook. To enhance the learning environment the use of posters, models and videos may be added. Patients will learn how to determine their genetic susceptibility to disease by reviewing their family history. Patients will gain insight into the importance of diet as part of an overall healthy lifestyle in mitigating the impact of genetics and other environmental insults. The purpose of this lesson is to provide patients with the opportunity to assess their personal nutrition priorities by looking at their family history, their own health history and current risk factors. Patients will also be able to discuss ways of prioritizing and modifying the [place] for their highest risk areas  Menu  Clinical staff led group instruction and group discussion with PowerPoint presentation and patient guidebook. To enhance the learning environment the use of posters, models and videos may be added. Using menus brought in from [employer], or printed from [employer], patients will apply the [place] dining out guidelines that were presented in the [employer] video. Patients will also be able to practice these guidelines in a variety of provided scenarios. The purpose of this lesson is to provide patients with the opportunity to practice hands-on learning of the [place] with actual menus and practice scenarios.  Label Reading Clinical staff led group instruction and group discussion with PowerPoint presentation and patient guidebook. To enhance the learning environment the use of posters, models and videos may be added. Patients will review  and discuss the Pritikin label reading guidelines presented in Pritikin's Label Reading Educational series video. Using fool labels brought in from local grocery stores and markets, patients will apply the label reading guidelines and determine if the packaged food meet the Pritikin guidelines. The purpose of this lesson is to provide patients with the opportunity to review, discuss, and practice hands-on learning of the Pritikin Label Reading guidelines with actual packaged food labels. [place] Workshops are designed to teach patients ways to prepare quick, simple, and affordable recipes at home. The importance of nutrition's role in chronic disease risk reduction is reflected in its emphasis in the overall Pritikin program. By learning how to prepare essential core Pritikin Eating Plan recipes, patients will increase control over what they eat; be  able to customize the flavor of foods without the use of added salt, sugar, or fat; and improve the quality of the food they consume. By learning a set of core recipes which are easily assembled, quickly prepared, and affordable, patients are more likely to prepare more healthy foods at home. These workshops focus on convenient breakfasts, simple entres, side dishes, and desserts which can be prepared with minimal effort and are consistent with nutrition recommendations for cardiovascular risk reduction. Cooking [employer] are taught by a Engineer, materials (RD) who has been trained by the [employer]. The chef or RD has a clear understanding of the importance of minimizing - if not completely eliminating - added fat, sugar, and sodium in recipes. Throughout the series of [place] Workshop sessions, patients will learn about healthy ingredients and efficient methods of cooking to build confidence in their capability to prepare    Cooking School weekly topics:  Adding Flavor- Sodium-Free  Fast  and Healthy Breakfasts  Powerhouse Plant-Based Proteins  Satisfying Salads and Dressings  Simple Sides and Sauces  International Cuisine-Spotlight on the [place] Zones  Delicious Desserts  Savory Soups  Efficiency Cooking - Meals in a Snap  Tasty Appetizers and Snacks  Comforting Weekend Breakfasts  One-Pot Wonders   Fast Evening Meals  Easy Story (Psychosocial): New Thoughts, New Behaviors Clinical staff led group instruction and group discussion with PowerPoint presentation and patient guidebook. To enhance the learning environment the use of posters, models and videos may be added. Patients will learn and practice techniques for developing effective health and lifestyle goals. Patients will be able to effectively apply the goal setting process learned to develop at least one new personal goal.  The purpose of this lesson is to expose patients to a new skill set of behavior modification techniques such as techniques setting SMART goals, overcoming barriers, and achieving new thoughts and new behaviors.  Managing Moods and Relationships Clinical staff led group instruction and group discussion with PowerPoint presentation and patient guidebook. To enhance the learning environment the use of posters, models and videos may be added. Patients will learn how emotional and chronic stress factors can impact their health and relationships. They will learn healthy ways to manage their moods and utilize positive coping mechanisms. In addition, ICR patients will learn ways to improve communication skills. The purpose of this lesson is to expose patients to ways of understanding how one's mood and health are intimately connected. Developing a healthy outlook can help build positive relationships and connections with others. Patients will understand the importance of utilizing effective communication skills that include actively  listening and being heard. They will learn and understand the importance of the "4 Cs" and especially Connections in fostering of a Healthy Mind-Set.  Healthy Sleep for a Healthy Heart Clinical staff led group instruction and group discussion with PowerPoint presentation and patient guidebook. To enhance the learning environment the use of posters, models and videos may be added. At the conclusion of this workshop, patients will be able to demonstrate knowledge of the importance of sleep to overall health, well-being, and quality of life. They will understand the symptoms of, and treatments for, common sleep disorders. Patients will also be able to identify daytime and nighttime behaviors which impact sleep, and they will be able to apply these tools to help manage sleep-related challenges. The purpose of this lesson is to provide patients with a general overview  of sleep and outline the importance of quality sleep. Patients will learn about a few of the most common sleep disorders. Patients will also be introduced to the concept of "sleep hygiene," and discover ways to self-manage certain sleeping problems through simple daily behavior changes. Finally, the workshop will motivate patients by clarifying the links between quality sleep and their goals of heart-healthy living.   Recognizing and Reducing Stress Clinical staff led group instruction and group discussion with PowerPoint presentation and patient guidebook. To enhance the learning environment the use of posters, models and videos may be added. At the conclusion of this workshop, patients will be able to understand the types of stress reactions, differentiate between acute and chronic stress, and recognize the impact that chronic stress has on their health. They will also be able to apply different coping mechanisms, such as reframing negative self-talk. Patients will have the opportunity to practice a variety of stress management techniques, such as  deep abdominal breathing, progressive muscle relaxation, and/or guided imagery.  The purpose of this lesson is to educate patients on the role of stress in their lives and to provide healthy techniques for coping with it.  Learning Barriers/Preferences:  Learning Barriers/Preferences - 10/16/21 1205       Learning Barriers/Preferences   Learning Barriers Exercise Concerns   Ollen Gross sometimes feel lightheaded when he chnages positons   Learning Preferences Skilled Demonstration             Education Topics:  Knowledge Questionnaire Score:  Knowledge Questionnaire Score - 10/16/21 1102       Knowledge Questionnaire Score   Pre Score 19/24             Core Components/Risk Factors/Patient Goals at Admission:  Personal Goals and Risk Factors at Admission - 10/16/21 1041       Core Components/Risk Factors/Patient Goals on Admission    Weight Management Yes;Obesity;Weight Loss    Intervention Weight Management/Obesity: Establish reasonable short term and long term weight goals.;Obesity: Provide education and appropriate resources to help participant work on and attain dietary goals.    Admit Weight 206 lb 9.6 oz (93.7 kg)    Goal Weight: Short Term 196 lb 3.4 oz (89 kg)    Goal Weight: Long Term 191 lb (86.6 kg)    Expected Outcomes Short Term: Continue to assess and modify interventions until short term weight is achieved;Long Term: Adherence to nutrition and physical activity/exercise program aimed toward attainment of established weight goal;Weight Loss: Understanding of general recommendations for a balanced deficit meal plan, which promotes 1-2 lb weight loss per week and includes a negative energy balance of [id number] kcal/d    Hypertension Yes    Intervention Provide education on lifestyle modifcations including regular physical activity/exercise, weight management, moderate sodium restriction and increased consumption of fresh fruit, vegetables, and low fat dairy, alcohol  moderation, and smoking cessation.;Monitor prescription use compliance.    Expected Outcomes Short Term: Continued assessment and intervention until BP is < 140/18m HG in hypertensive participants. < 130/864mHG in hypertensive participants with diabetes, heart failure or chronic kidney disease.;Long Term: Maintenance of blood pressure at goal levels.    Lipids Yes    Intervention Provide education and support for participant on nutrition & aerobic/resistive exercise along with prescribed medications to achieve LDL '70mg'$ , HDL >'40mg'$ .    Expected Outcomes Short Term: Participant states understanding of desired cholesterol values and is compliant with medications prescribed. Participant is following exercise prescription and nutrition guidelines.;Long Term: Cholesterol controlled with medications  as prescribed, with individualized exercise RX and with personalized nutrition plan. Value goals: LDL < '70mg'$ , HDL > 40 mg.    Stress Yes    Intervention Offer individual and/or small group education and counseling on adjustment to heart disease, stress management and health-related lifestyle change. Teach and support self-help strategies.;Refer participants experiencing significant psychosocial distress to appropriate mental health specialists for further evaluation and treatment. When possible, include family members and significant others in education/counseling sessions.    Expected Outcomes Short Term: Participant demonstrates changes in health-related behavior, relaxation and other stress management skills, ability to obtain effective social support, and compliance with psychotropic medications if prescribed.;Long Term: Emotional wellbeing is indicated by absence of clinically significant psychosocial distress or social isolation.             Core Components/Risk Factors/Patient Goals Review:    Core Components/Risk Factors/Patient Goals at Discharge (Final Review):    ITP Comments:  ITP Comments      Row Name 10/16/21 0857           ITP Comments Medical Director- Dr. Fransico Him, MD, Introduction to Pritikin Education Program/ Intensive Cardiac rehab. Initial Orientation Packet Reviewed with the patient                Comments: Participant attended orientation for the cardiac rehabilitation program on  10/16/2021  to perform initial intake and exercise walk test. Patient introduced to the [facility] education and orientation packet was reviewed. Completed 6-minute walk test, measurements, initial ITP, and exercise prescription. Vital signs stable. Telemetry-sinus bradycardia, EKG strips faxed to [place], patient was asymptomatic.   Service time was from 0857 to 1050.

## 2021-10-22 ENCOUNTER — Encounter (HOSPITAL_COMMUNITY): Payer: No Typology Code available for payment source

## 2021-10-24 ENCOUNTER — Encounter (HOSPITAL_COMMUNITY)
Admission: RE | Admit: 2021-10-24 | Discharge: 2021-10-24 | Disposition: A | Discharge status: Home / Self Care | Payer: No Typology Code available for payment source | Source: Ambulatory Visit | Attending: Cardiology | Admitting: Cardiology

## 2021-10-24 DIAGNOSIS — Z951 Presence of aortocoronary bypass graft: Secondary | ICD-10-CM

## 2021-10-24 NOTE — Progress Notes (Signed)
Daily Session Note  Patient Details  Name: Nathan Barrett MRN: [id number] Date of Birth: 04-03-1965 Referring Provider:   Flowsheet Row INTENSIVE CARDIAC REHAB ORIENT from 10/16/2021 in [place]  Referring Provider Nathan Berthold, MD  Nathan Margarita, MD (covering)]       Encounter Date: 10/24/2021  Check In:  Session Check In - 10/24/21 0918       Check-In   Supervising physician immediately available to respond to emergencies Nathan Barrett    Physician(s) Nathan Barrett    Location [facility]    Staff Present Nathan Carol, MS, ACSM-CEP, Exercise Physiologist;Nathan Gerhart, RN, Nathan Glazier, MS, ACSM-CEP, CCRP, Exercise Physiologist;Nathan Pearline Cables, MS, Exercise Physiologist;Nathan Wilber Oliphant, RN, BSN;Nathan Porter, MS, Exercise Physiologist    Virtual Visit No    Medication changes reported     No    Fall or balance concerns reported    No    Tobacco Cessation No Change    Warm-up and Cool-down Not performed (comment)   Cardiac Rehab Orientation   Resistance Training Performed No    VAD Patient? No    PAD/SET Patient? No      Pain Assessment   Currently in Pain? No/denies    Pain Score 0-No pain    Multiple Pain Sites No             Capillary Blood Glucose: No results found for this or any previous visit (from the past 24 hour(s)).   Exercise Prescription Changes - 10/24/21 1100       Response to Exercise   Blood Pressure (Admit) 128/72    Blood Pressure (Exercise) 142/82    Blood Pressure (Exit) 100/72    Heart Rate (Admit) 74 bpm    Heart Rate (Exercise) 121 bpm    Heart Rate (Exit) 69 bpm    Rating of Perceived Exertion (Exercise) 11    Symptoms None    Comments Pt's first day in the [facility] program    Duration Continue with 30 min of aerobic exercise without signs/symptoms of physical distress.    Intensity THRR unchanged      Progression   Progression Continue to progress workloads to  maintain intensity without signs/symptoms of physical distress.    Average METs 3.85      Resistance Training   Training Prescription No    Weight No weights on Wednesdays      Bike   Level 2.5    Minutes 15    METs 4.3      NuStep   Level 3    Minutes 15    METs 3.4             Social History   Tobacco Use  Smoking Status Never  Smokeless Tobacco Never    Goals Met:  Exercise tolerated well No report of concerns or symptoms today  Goals Unmet:  Not Applicable  Comments: Pt started cardiac rehab today.  Pt tolerated light exercise without difficulty. VSS, telemetry-Sinus Rhythm, asymptomatic.  Medication list reconciled. Pt denies barriers to medicaiton compliance.  PSYCHOSOCIAL ASSESSMENT:  PHQ-13. Pt exhibits positive coping skills, hopeful outlook with supportive family. Nathan Barrett has current depression and PTSD from serving time in the [employer]. Nathan Barrett has good support from his family and is getting counseling every other week. QUALITY OF LIFE SCORE REVIEW  Pt completed Quality of Life survey as a participant in Cardiac Rehab.  Scores 21.0 or below are considered low.  Pt score very  low in several areas Overall 16.88, Health and Function 13.93, socioeconomic 19.58, physiological and spiritual 16.64, family 22.3. Patient quality of life slightly altered by physical constraints which limits ability to perform as prior to recent cardiac illness.Nathan Barrett does not want his quality of life questionnaire forwarded to the [place] at this time.  Offered emotional support and reassurance.  Will continue to monitor and intervene as necessary.    .   Pt oriented to exercise equipment and routine.    Understanding verbalized. [facility].Will continue to monitor the patient throughout  the program.Nathan Barrett Nathan Rota RN BSN    Dr. Fransico Barrett is Medical Director for Cardiac Rehab at [facility].

## 2021-10-26 ENCOUNTER — Encounter (HOSPITAL_COMMUNITY)
Admission: RE | Admit: 2021-10-26 | Discharge: 2021-10-26 | Disposition: A | Discharge status: Home / Self Care | Payer: No Typology Code available for payment source | Source: Ambulatory Visit | Attending: Cardiology | Admitting: Cardiology

## 2021-10-26 DIAGNOSIS — Z951 Presence of aortocoronary bypass graft: Secondary | ICD-10-CM

## 2021-10-29 ENCOUNTER — Encounter (HOSPITAL_COMMUNITY)
Admission: RE | Admit: 2021-10-29 | Discharge: 2021-10-29 | Disposition: A | Discharge status: Home / Self Care | Payer: No Typology Code available for payment source | Source: Ambulatory Visit | Attending: Cardiology | Admitting: Cardiology

## 2021-10-29 DIAGNOSIS — Z951 Presence of aortocoronary bypass graft: Secondary | ICD-10-CM

## 2021-10-30 NOTE — Progress Notes (Signed)
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 Swallow whole., Disp: , Rfl:    atorvastatin (LIPITOR) 80 MG tablet, Take 1 tablet (80 mg total) by mouth every morning. (Patient taking differently: Take 40 mg by mouth every morning.), Disp: 90 tablet, Rfl: 1   Cholecalciferol (VITAMIN D3) 125 MCG (5000 UT) TABS, Take 5,000 Units by mouth daily., Disp: , Rfl:    clopidogrel (PLAVIX) 75 MG tablet, TAKE 1 TABLET BY MOUTH EVERY DAY (Patient taking differently: Take 75 mg by mouth daily.), Disp: 90 tablet, Rfl: 1   diclofenac Sodium (VOLTAREN) 1 % GEL, Apply 2 g topically daily as needed (pIN)., Disp: , Rfl:    losartan-hydrochlorothiazide (HYZAAR) 100-25 MG tablet, Take 1 tablet by mouth daily., Disp: , Rfl:    magnesium oxide (MAG-OX) 400 (240 Mg) MG tablet, Take 400 mg by mouth 3 (three) times a week., Disp: , Rfl:    metoprolol tartrate (LOPRESSOR) 25 MG tablet, Take 25 mg by mouth 2 (two) times daily., Disp: , Rfl:    Multiple Vitamin (MULTIVITAMIN WITH MINERALS) TABS tablet, Take 1 tablet by mouth daily. Centrum Silver  men's 50 +, Disp: , Rfl:    omeprazole (PRILOSEC) 40 MG capsule, Take 40 mg by mouth daily., Disp: , Rfl:    traZODone (DESYREL) 50 MG tablet, Take 100 mg by mouth at bedtime., Disp: , Rfl:   Past Medical History: Past Medical History:  Diagnosis Date   Aortic stenosis    Mild   Bradycardia    CKD (chronic kidney disease), stage II    Coronary artery disease    a. multivessel, treated medically.   H. pylori infection    Hypercholesterolemia    Hypertension     Tobacco Use: Social History   Tobacco Use  Smoking Status Never  Smokeless Tobacco Never    Labs: Review Flowsheet  More data exists      Latest Ref Rng & Units 03/15/2012 03/30/2015 07/12/2016 10/22/2018 07/31/2020  Labs for ITP Cardiac and Pulmonary Rehab  Cholestrol 100 - 199 mg/dL - 137  120  126  -  LDL (calc) 0 - 99 mg/dL - 84  66  73  -  HDL-C >39 mg/dL - 37  40  35  -  Trlycerides 0 - 149 mg/dL - 78  70  97  -  TCO2 22 - 32 mmol/L 27  - - - 24     Capillary Blood Glucose: Lab Results  Component Value Date   GLUCAP 116 (H) 12/20/2018   GLUCAP 124 (H) 03/15/2012  Exercise Target Goals: Exercise Program Goal: Individual exercise prescription set using results from initial 6 min walk test and THRR while considering  patient's activity barriers and safety.   Exercise Prescription Goal: Initial exercise prescription builds to 30-45 minutes a day of aerobic activity, 2-3 days per week.  Home exercise guidelines will be given to patient during program as part of exercise prescription that the participant will acknowledge.  Activity Barriers & Risk Stratification:  Activity Barriers & Cardiac Risk Stratification - 10/16/21 0938       Activity Barriers & Cardiac Risk Stratification   Activity Barriers Other (comment)    Comments Needs left shoulder surgery. Bilateral knee pain, gets gel injections every 6 months, needs knee replacement.    Cardiac Risk Stratification Moderate             6 Minute  Walk:  6 Minute Walk     Row Name 10/16/21 0927         6 Minute Walk   Phase Initial     Distance 1675 feet     Walk Time 6 minutes     # of Rest Breaks 0     MPH 3.17     METS 3.63     RPE 11     Perceived Dyspnea  1     VO2 Peak 12.69     Symptoms Yes (comment)     Comments Mild SOB, RPD=1     Resting HR 52 bpm     Resting BP 122/84     Resting Oxygen Saturation  97 %     Exercise Oxygen Saturation  during 6 min walk 100 %     Max Ex. HR 73 bpm     Max Ex. BP 120/80     2 Minute Post BP 112/80              Oxygen Initial Assessment:   Oxygen Re-Evaluation:   Oxygen Discharge (Final Oxygen Re-Evaluation):   Initial Exercise Prescription:  Initial Exercise Prescription - 10/16/21 1000       Date of Initial Exercise RX and Referring Provider   Date 10/16/21    Referring Provider Filbert Berthold, MD   Sueanne Margarita, MD (covering)   Expected Discharge Date 12/14/21      Bike   Level 2.5    Minutes 15    METs 3.6      NuStep   Level 3    SPM 85    Minutes 15    METs 3      Prescription Details   Frequency (times per week) 3    Duration Progress to 30 minutes of continuous aerobic without signs/symptoms of physical distress      Intensity   THRR 40-80% of Max Heartrate 65-131    Ratings of Perceived Exertion 11-13    Perceived Dyspnea 0-4      Progression   Progression Continue to progress workloads to maintain intensity without signs/symptoms of physical distress.      Resistance Training   Training Prescription Yes    Weight 5 lbs    Reps 10-15             Perform Capillary Blood Glucose checks as needed.  Exercise Prescription Changes:   Exercise Prescription Changes     Row Name 10/24/21 1100             Response to Exercise   Blood Pressure (Admit) 128/72       Blood  Pressure (Exercise) 142/82       Blood Pressure (Exit) 100/72       Heart Rate (Admit) 74 bpm       Heart Rate (Exercise) 121 bpm       Heart Rate  (Exit) 69 bpm       Rating of Perceived Exertion (Exercise) 11       Symptoms None       Comments Pt's first day in the [facility] program       Duration Continue with 30 min of aerobic exercise without signs/symptoms of physical distress.       Intensity THRR unchanged         Progression   Progression Continue to progress workloads to maintain intensity without signs/symptoms of physical distress.       Average METs 3.85         Resistance Training   Training Prescription No       Weight No weights on Wednesdays         Bike   Level 2.5       Minutes 15       METs 4.3         NuStep   Level 3       Minutes 15       METs 3.4                Exercise Comments:   Exercise Comments     Row Name 10/24/21 1149           Exercise Comments Pt's first day in the [facility] program. Pt exercised with no complaints.                Exercise Goals and Review:   Exercise Goals     Row Name 10/16/21 0913             Exercise Goals   Increase Physical Activity Yes       Intervention Provide advice, education, support and counseling about physical activity/exercise needs.;Develop an individualized exercise prescription for aerobic and resistive training based on initial evaluation findings, risk stratification, comorbidities and participant's personal goals.       Expected Outcomes Short Term: Attend rehab on a regular basis to increase amount of physical activity.;Long Term: Exercising regularly at least 3-5 days a week.;Long Term: Add in home exercise to make exercise part of routine and to increase amount of physical activity.       Increase Strength and Stamina Yes       Intervention Provide advice, education, support and counseling about physical activity/exercise needs.;Develop an individualized exercise prescription for aerobic and resistive training based on initial evaluation findings, risk stratification, comorbidities and participant's personal goals.       Expected  Outcomes Short Term: Increase workloads from initial exercise prescription for resistance, speed, and METs.;Short Term: Perform resistance training exercises routinely during rehab and add in resistance training at home;Long Term: Improve cardiorespiratory fitness, muscular endurance and strength as measured by increased METs and functional capacity (6MWT)       Able to understand and use rate of perceived exertion (RPE) scale Yes       Intervention Provide education and explanation on how to use RPE scale       Expected Outcomes Short Term: Able to use RPE daily in rehab to express subjective intensity level;Long Term:  Able to use RPE to guide intensity level when exercising independently       Knowledge and understanding of  Target Heart Rate Range (THRR) Yes       Intervention Provide education and explanation of THRR including how the numbers were predicted and where they are located for reference       Expected Outcomes Short Term: Able to state/look up THRR;Long Term: Able to use THRR to govern intensity when exercising independently;Short Term: Able to use daily as guideline for intensity in rehab       Able to check pulse independently Yes       Intervention Provide education and demonstration on how to check pulse in carotid and radial arteries.;Review the importance of being able to check your own pulse for safety during independent exercise       Expected Outcomes Short Term: Able to explain why pulse checking is important during independent exercise;Long Term: Able to check pulse independently and accurately       Understanding of Exercise Prescription Yes       Intervention Provide education, explanation, and written materials on patient's individual exercise prescription       Expected Outcomes Short Term: Able to explain program exercise prescription;Long Term: Able to explain home exercise prescription to exercise independently                Exercise Goals Re-Evaluation :   Exercise Goals Re-Evaluation     Row Name 10/24/21 1146             Exercise Goal Re-Evaluation   Exercise Goals Review Increase Physical Activity;Increase Strength and Stamina;Able to understand and use rate of perceived exertion (RPE) scale;Knowledge and understanding of Target Heart Rate Range (THRR);Understanding of Exercise Prescription       Comments Pt's first day in the [facility] program, Pt understands the exercise Rx, RPE scale, THRR.       Expected Outcomes Will continue to monitor patient and progress exercise workloads as tolerated.                Discharge Exercise Prescription (Final Exercise Prescription Changes):  Exercise Prescription Changes - 10/24/21 1100       Response to Exercise   Blood Pressure (Admit) 128/72    Blood Pressure (Exercise) 142/82    Blood Pressure (Exit) 100/72    Heart Rate (Admit) 74 bpm    Heart Rate (Exercise) 121 bpm    Heart Rate (Exit) 69 bpm    Rating of Perceived Exertion (Exercise) 11    Symptoms None    Comments Pt's first day in the [facility] program    Duration Continue with 30 min of aerobic exercise without signs/symptoms of physical distress.    Intensity THRR unchanged      Progression   Progression Continue to progress workloads to maintain intensity without signs/symptoms of physical distress.    Average METs 3.85      Resistance Training   Training Prescription No    Weight No weights on Wednesdays      Bike   Level 2.5    Minutes 15    METs 4.3      NuStep   Level 3    Minutes 15    METs 3.4             Nutrition:  Target Goals: Understanding of nutrition guidelines, daily intake of sodium '1500mg'$ , cholesterol '200mg'$ , calories 30% from fat and 7% or less from saturated fats, daily to have 5 or more servings of fruits and vegetables.  Biometrics:  Pre Biometrics - 10/16/21 9528  Pre Biometrics   Waist Circumference 44 inches    Hip Circumference 41.75 inches    Waist to Hip Ratio 1.05 %     Triceps Skinfold 21 mm    % Body Fat 32.6 %    Grip Strength 44 kg    Flexibility 10.75 in    Single Leg Stand 17.12 seconds              Nutrition Therapy Plan and Nutrition Goals:  Nutrition Therapy & Goals - 10/24/21 1050       Nutrition Therapy   Diet Heart healthy diet    Drug/Food Interactions Statins/Certain Fruits      Personal Nutrition Goals   Nutrition Goal Patient to identify strategies for managing cardiovascular risk by attending the weekly Pritikin education and nutrition classes    Personal Goal #2 Patient to limit to '1500mg'$  of sodium    Personal Goal #3 Patient to identify food sources and limit daily intake of saturated fat, trans fat, and sodium    Comments Patient reports motivation to increase fiber through fruit and vegetable intake and decrease mindless snacking type behaviors. A1c has improved from 5.9 to 5.6.      Intervention Plan   Intervention Nutrition handout(s) given to patient.;Prescribe, educate and counsel regarding individualized specific dietary modifications aiming towards targeted core components such as weight, hypertension, lipid management, diabetes, heart failure and other comorbidities.    Expected Outcomes Short Term Goal: Understand basic principles of dietary content, such as calories, fat, sodium, cholesterol and nutrients.;Long Term Goal: Adherence to prescribed nutrition plan.             Nutrition Assessments:  Nutrition Assessments - 10/24/21 1055       Rate Your Plate Scores   Pre Score 53            MEDIFICTS Score Key: ?70 Need to make dietary changes  40-70 Heart Healthy Diet ? 40 Therapeutic Level Cholesterol Diet   Flowsheet Row INTENSIVE CARDIAC REHAB from 10/24/2021 in [place]  Picture Your Plate Total Score on Admission 53      Picture Your Plate Scores: <44 Unhealthy dietary pattern with much room for improvement. 41-50 Dietary pattern unlikely to meet  recommendations for good health and room for improvement. 51-60 More healthful dietary pattern, with some room for improvement.  >60 Healthy dietary pattern, although there may be some specific behaviors that could be improved.    Nutrition Goals Re-Evaluation:  Nutrition Goals Re-Evaluation     [place] Name 10/24/21 1050             Goals   Current Weight 209 lb 10.5 oz (95.1 kg)       Comment Lipid panel WNL, A1c 5.6       Expected Outcome Patient reports motivation to increase fiber through fruit and vegetable intake and decrease mindless snacking type behaviors. A1c has improved from 5.9 to 5.6.                Nutrition Goals Re-Evaluation:  Nutrition Goals Re-Evaluation     [place] Name 10/24/21 1050             Goals   Current Weight 209 lb 10.5 oz (95.1 kg)       Comment Lipid panel WNL, A1c 5.6       Expected Outcome Patient reports motivation to increase fiber through fruit and vegetable intake and decrease mindless snacking type behaviors. A1c has improved from 5.9  to 5.6.                Nutrition Goals Discharge (Final Nutrition Goals Re-Evaluation):  Nutrition Goals Re-Evaluation - 10/24/21 1050       Goals   Current Weight 209 lb 10.5 oz (95.1 kg)    Comment Lipid panel WNL, A1c 5.6    Expected Outcome Patient reports motivation to increase fiber through fruit and vegetable intake and decrease mindless snacking type behaviors. A1c has improved from 5.9 to 5.6.             Psychosocial: Target Goals: Acknowledge presence or absence of significant depression and/or stress, maximize coping skills, provide positive support system. Participant is able to verbalize types and ability to use techniques and skills needed for reducing stress and depression.  Initial Review & Psychosocial Screening:  Initial Psych Review & Screening - 10/16/21 1202       Initial Review   Current issues with Current Depression;History of Depression;Current Sleep  Concerns;Current Stress Concerns    Source of Stress Concerns Chronic Illness;Poor Coping Skills    Comments Nathan Barrett admits he is depressed. he is reciving coun selling from the [facility]? 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 Nathan Barrett receives therapy every other week which he finds helpful.      Barriers   Psychosocial barriers to participate in program The patient should benefit from training in stress management and relaxation.      Screening Interventions   Interventions Encouraged to exercise;Provide feedback about the scores to participant;To provide support and resources with identified psychosocial needs    Expected Outcomes Long Term Goal: Stressors or current issues are controlled or eliminated.;Short Term goal: Identification and review with participant of any Quality of Life or Depression concerns found by scoring the questionnaire.;Long Term goal: The participant improves quality of Life and PHQ9 Scores as seen by post scores and/or verbalization of changes             Quality of Life Scores:  Quality of Life - 10/16/21 1102       Quality of Life   Select Quality of Life      Quality of Life Scores   Health/Function Pre 13.93 %    Socioeconomic Pre 19.58 %    Psych/Spiritual Pre 16.64 %    Family Pre 22.8 %    GLOBAL Pre 16.88 %            Scores of 19 and below usually indicate a poorer quality of life in these areas.  A difference of  2-3 points is a clinically meaningful difference.  A difference of 2-3 points in the total score of the Quality of Life Index has been associated with significant improvement in overall quality of life, self-image, physical symptoms, and general health in studies assessing change in quality of life.  PHQ-9: Review Flowsheet       10/16/2021 09/05/2014 05/18/2014  Depression screen PHQ 2/9  Decreased Interest 3 0 0  Down,  Depressed, Hopeless 2 0 0  PHQ - 2 Score 5 0 0  Altered sleeping 3 - -  Tired, decreased energy 1 - -  Change in appetite 0 - -  Feeling bad or failure about yourself  2 - -  Trouble concentrating 2 - -  Moving slowly or fidgety/restless 0 - -  Suicidal thoughts 0 - -  PHQ-9 Score 13 - -  Difficult doing work/chores Very difficult - -   Interpretation of Total Score  Total Score Depression Severity:  1-4 = Minimal depression, 5-9 = Mild depression, 10-14 = Moderate depression, 15-19 = Moderately severe depression, 20-27 = Severe depression   Psychosocial Evaluation and Intervention:   Psychosocial Re-Evaluation:  Psychosocial Re-Evaluation     [place] Name 10/24/21 1124             Psychosocial Re-Evaluation   Current issues with Current Stress Concerns;Current Sleep Concerns;History of Depression;Current Depression       Comments Nathan Barrett is currently depressed. Nathan Barrett is receving counselling biweekly as he aslo suffers from PTSD. Quality of life questionnaire reviewed. Nathan Barrett says his depression and PTSD is controlled on medication       Expected Outcomes [place] will have decreased or controlled  depression upon completion of intensive cardiac rehab.       Interventions Encouraged to attend Cardiac Rehabilitation for the exercise;Stress management education;Relaxation education       Continue Psychosocial Services  Follow up required by staff         Initial Review   Source of Stress Concerns Chronic Illness;Retirement/disability;Poor Coping Skills       Comments Will continue to monitor and offer support as needed                Psychosocial Discharge (Final Psychosocial Re-Evaluation):  Psychosocial Re-Evaluation - 10/24/21 1124       Psychosocial Re-Evaluation   Current issues with Current Stress Concerns;Current Sleep Concerns;History of Depression;Current Depression    Comments Nathan Barrett is currently depressed. Nathan Barrett is receving counselling biweekly as he aslo suffers from  PTSD. Quality of life questionnaire reviewed. Yaseen says his depression and PTSD is controlled on medication    Expected Outcomes Nathan Barrett will have decreased or controlled  depression upon completion of intensive cardiac rehab.    Interventions Encouraged to attend Cardiac Rehabilitation for the exercise;Stress management education;Relaxation education    Continue Psychosocial Services  Follow up required by staff      Initial Review   Source of Stress Concerns Chronic Illness;Retirement/disability;Poor Coping Skills    Comments Will continue to monitor and offer support as needed             Vocational Rehabilitation: Provide vocational rehab assistance to qualifying candidates.   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            Education: Education Goals: Education classes will be provided on a weekly basis, covering required topics. Participant will state understanding/return demonstration of topics presented.    Education     Row Name 10/24/21 0900     Education   Cardiac Education Topics Pritikin   Dance movement psychotherapist School   Educator Dietitian   Weekly Topic [employer] Desserts   Instruction Review Code 1- Verbalizes Understanding   Class Start Time [phone]   Class Stop Time [phone]   Class Time Calculation (min) 31 min    Row Name 10/26/21 0900     Education   Cardiac Education Topics Pritikin   Environmental consultant Psychosocial  Psychosocial Workshop Managing Moods and Relationships   Instruction Review Code 1- Verbalizes Understanding   Class Start Time [phone]   Class Stop Time 0851   Class Time Calculation (min) 41 min    Row Name 10/29/21 0900      Education   Cardiac Education Topics [place]   [employer] Workshops     Workshops   Educator Exercise Physiologist   Select Exercise   Exercise Workshop Exercise Basics: Building Your Action Plan   Instruction Review Code 1- Verbalizes Understanding   Class Start Time [phone]   Class Stop Time 0855   Class Time Calculation (min) 43 min            Core Videos: Exercise    Move It!  Clinical staff conducted group or individual video education with verbal and written material and guidebook.  Patient learns the recommended Pritikin exercise program. Exercise with the goal of living a long, healthy life. Some of the health benefits of exercise include controlled diabetes, healthier blood pressure levels, improved cholesterol levels, improved heart and lung capacity, improved sleep, and better body composition. Everyone should speak with their doctor before starting or changing an exercise routine.  Biomechanical Limitations Clinical staff conducted group or individual video education with verbal and written material and guidebook.  Patient learns how biomechanical limitations can impact exercise and how we can mitigate and possibly overcome limitations to have an impactful and balanced exercise routine.  Body Composition Clinical staff conducted group or individual video education with verbal and written material and guidebook.  Patient learns that body composition (ratio of muscle mass to fat mass) is a key component to assessing overall fitness, rather than body weight alone. Increased fat mass, especially visceral belly fat, can put [place] at increased risk for metabolic syndrome, type 2 diabetes, heart disease, and even death. It is recommended to combine diet and exercise (cardiovascular and resistance training) to improve your body composition. Seek guidance from your physician and exercise physiologist before implementing an exercise routine.  Exercise Action Plan Clinical staff conducted  group or individual video education with verbal and written material and guidebook.  Patient learns the recommended strategies to achieve and enjoy long-term exercise adherence, including variety, self-motivation, self-efficacy, and positive decision making. Benefits of exercise include fitness, good health, weight management, more energy, better sleep, less stress, and overall well-being.  Medical   Heart Disease Risk Reduction Clinical staff conducted group or individual video education with verbal and written material and guidebook.  Patient learns our heart is our most vital organ as it circulates oxygen, nutrients, white blood cells, and hormones throughout the entire body, and carries waste away. Data supports a plant-based eating plan like the Pritikin Program for its effectiveness in slowing progression of and reversing heart disease. The video provides a number of recommendations to address heart disease.   Metabolic Syndrome and Belly Fat  Clinical staff conducted group or individual video education with verbal and written material and guidebook.  Patient learns what metabolic syndrome is, how it leads to heart disease, and how one can reverse it and keep it from coming back. You have metabolic syndrome if you have 3 of the following 5 criteria: abdominal obesity, high blood pressure, high triglycerides, low HDL cholesterol, and high blood sugar.  Hypertension and Heart Disease Clinical staff conducted group or individual video education with verbal and written material and guidebook.  Patient learns that high blood pressure, or hypertension, is very common in the [place]. Hypertension is  largely due to excessive salt intake, but other important risk factors include being overweight, physical inactivity, drinking too much alcohol, smoking, and not eating enough potassium from fruits and vegetables. High blood pressure is a leading risk factor for heart attack, stroke, congestive heart  failure, dementia, kidney failure, and premature death. Long-term effects of excessive salt intake include stiffening of the arteries and thickening of heart muscle and organ damage. Recommendations include ways to reduce hypertension and the risk of heart disease.  Diseases of Our Time - Focusing on Diabetes Clinical staff conducted group or individual video education with verbal and written material and guidebook.  Patient learns why the best way to stop diseases of our time is prevention, through food and other lifestyle changes. Medicine (such as prescription pills and surgeries) is often only a Band-Aid on the problem, not a long-term solution. Most common diseases of our time include obesity, type 2 diabetes, hypertension, heart disease, and cancer. The Pritikin Program is recommended and has been proven to help reduce, reverse, and/or prevent the damaging effects of metabolic syndrome.  Nutrition   Overview of the Pritikin Eating Plan  Clinical staff conducted group or individual video education with verbal and written material and guidebook.  Patient learns about the [place] for disease risk reduction. The [place] emphasizes a wide variety of unrefined, minimally-processed carbohydrates, like fruits, vegetables, whole grains, and legumes. Go, Caution, and Stop food choices are explained. Plant-based and lean animal proteins are emphasized. Rationale provided for low sodium intake for blood pressure control, low added sugars for blood sugar stabilization, and low added fats and oils for coronary artery disease risk reduction and weight management.  Calorie Density  Clinical staff conducted group or individual video education with verbal and written material and guidebook.  Patient learns about calorie density and how it impacts the Pritikin Eating Plan. Knowing the characteristics of the food you choose will help you decide whether those foods will lead to weight gain or  weight loss, and whether you want to consume more or less of them. Weight loss is usually a side effect of the Pritikin Eating Plan because of its focus on low calorie-dense foods.  Label Reading  Clinical staff conducted group or individual video education with verbal and written material and guidebook.  Patient learns about the Pritikin recommended label reading guidelines and corresponding recommendations regarding calorie density, added sugars, sodium content, and whole grains.  Dining Out - Part 1  Clinical staff conducted group or individual video education with verbal and written material and guidebook.  Patient learns that restaurant meals can be sabotaging because they can be so high in calories, fat, sodium, and/or sugar. Patient learns recommended strategies on how to positively address this and avoid unhealthy pitfalls.  Facts on Fats  Clinical staff conducted group or individual video education with verbal and written material and guidebook.  Patient learns that lifestyle modifications can be just as effective, if not more so, as many medications for lowering your risk of heart disease. A Pritikin lifestyle can help to reduce your risk of inflammation and atherosclerosis (cholesterol build-up, or plaque, in the artery walls). Lifestyle interventions such as dietary choices and physical activity address the cause of atherosclerosis. A review of the types of fats and their impact on blood cholesterol levels, along with dietary recommendations to reduce fat intake is also included.  Nutrition Action Plan  Clinical staff conducted group or individual video education with verbal and written material and guidebook.  Patient learns how to incorporate Pritikin recommendations into their lifestyle. Recommendations include planning and keeping personal health goals in mind as an important part of their success.  Healthy Mind-Set    Healthy Minds, Bodies, Hearts  Clinical staff conducted group  or individual video education with verbal and written material and guidebook.  Patient learns how to identify when they are stressed. Video will discuss the impact of that stress, as well as the many benefits of stress management. Patient will also be introduced to stress management techniques. The way we think, act, and feel has an impact on our hearts.  How Our Thoughts Can Heal Our Hearts  Clinical staff conducted group or individual video education with verbal and written material and guidebook.  Patient learns that negative thoughts can cause depression and anxiety. This can result in negative lifestyle behavior and serious health problems. Cognitive behavioral therapy is an effective method to help control our thoughts in order to change and improve our emotional outlook.  Additional Videos:  Exercise    Improving Performance  Clinical staff conducted group or individual video education with verbal and written material and guidebook.  Patient learns to use a non-linear approach by alternating intensity levels and lengths of time spent exercising to help burn more calories and lose more body fat. Cardiovascular exercise helps improve heart health, metabolism, hormonal balance, blood sugar control, and recovery from fatigue. Resistance training improves strength, endurance, balance, coordination, reaction time, metabolism, and muscle mass. Flexibility exercise improves circulation, posture, and balance. Seek guidance from your physician and exercise physiologist before implementing an exercise routine and learn your capabilities and proper form for all exercise.  Introduction to Yoga  Clinical staff conducted group or individual video education with verbal and written material and guidebook.  Patient learns about yoga, a discipline of the coming together of mind, breath, and body. The benefits of yoga include improved flexibility, improved range of motion, better posture and core strength,  increased lung function, weight loss, and positive self-image. Yoga's heart health benefits include lowered blood pressure, healthier heart rate, decreased cholesterol and triglyceride levels, improved immune function, and reduced stress. Seek guidance from your physician and exercise physiologist before implementing an exercise routine and learn your capabilities and proper form for all exercise.  Medical   Aging: Enhancing Your Quality of Life  Clinical staff conducted group or individual video education with verbal and written material and guidebook.  Patient learns key strategies and recommendations to stay in good physical health and enhance quality of life, such as prevention strategies, having an advocate, securing a [place], and keeping a list of medications and system for tracking them. It also discusses how to avoid risk for bone loss.  Biology of Weight Control  Clinical staff conducted group or individual video education with verbal and written material and guidebook.  Patient learns that weight gain occurs because we consume more calories than we burn (eating more, moving less). Even if your body weight is normal, you may have higher ratios of fat compared to muscle mass. Too much body fat puts you at increased risk for cardiovascular disease, heart attack, stroke, type 2 diabetes, and obesity-related cancers. In addition to exercise, following the [place] can help reduce your risk.  Decoding Lab Results  Clinical staff conducted group or individual video education with verbal and written material and guidebook.  Patient learns that lab test reflects one measurement whose values change over time and are influenced  by many factors, including medication, stress, sleep, exercise, food, hydration, pre-existing medical conditions, and more. It is recommended to use the knowledge from this video to become more involved with your lab results and  evaluate your numbers to speak with your doctor.   Diseases of Our Time - Overview  Clinical staff conducted group or individual video education with verbal and written material and guidebook.  Patient learns that according to the CDC, 50% to 70% of chronic diseases (such as obesity, type 2 diabetes, elevated lipids, hypertension, and heart disease) are avoidable through lifestyle improvements including healthier food choices, listening to satiety cues, and increased physical activity.  Sleep Disorders Clinical staff conducted group or individual video education with verbal and written material and guidebook.  Patient learns how good quality and duration of sleep are important to overall health and well-being. Patient also learns about sleep disorders and how they impact health along with recommendations to address them, including discussing with a physician.  Nutrition  Dining Out - Part 2 Clinical staff conducted group or individual video education with verbal and written material and guidebook.  Patient learns how to plan ahead and communicate in order to maximize their dining experience in a healthy and nutritious manner. Included are recommended food choices based on the type of restaurant the patient is visiting.   Fueling a Best boy conducted group or individual video education with verbal and written material and guidebook.  There is a strong connection between our food choices and our health. Diseases like obesity and type 2 diabetes are very prevalent and are in large-part due to lifestyle choices. The Pritikin Eating Plan provides plenty of food and hunger-curbing satisfaction. It is easy to follow, affordable, and helps reduce health risks.  Menu Workshop  Clinical staff conducted group or individual video education with verbal and written material and guidebook.  Patient learns that restaurant meals can sabotage health goals because they are often packed with  calories, fat, sodium, and sugar. Recommendations include strategies to plan ahead and to communicate with the manager, chef, or server to help order a healthier meal.  Planning Your Eating Strategy  Clinical staff conducted group or individual video education with verbal and written material and guidebook.  Patient learns about the [place] and its benefit of reducing the risk of disease. The [place] does not focus on calories. Instead, it emphasizes high-quality, nutrient-rich foods. By knowing the characteristics of the foods, we choose, we can determine their calorie density and make informed decisions.  Targeting Your Nutrition Priorities  Clinical staff conducted group or individual video education with verbal and written material and guidebook.  Patient learns that lifestyle habits have a tremendous impact on disease risk and progression. This video provides eating and physical activity recommendations based on your personal health goals, such as reducing LDL cholesterol, losing weight, preventing or controlling type 2 diabetes, and reducing high blood pressure.  Vitamins and Minerals  Clinical staff conducted group or individual video education with verbal and written material and guidebook.  Patient learns different ways to obtain key vitamins and minerals, including through a recommended healthy diet. It is important to discuss all supplements you take with your doctor.   Healthy Mind-Set    Smoking Cessation  Clinical staff conducted group or individual video education with verbal and written material and guidebook.  Patient learns that cigarette smoking and tobacco addiction pose a serious health risk which affects millions of people. Stopping smoking will  significantly reduce the risk of heart disease, lung disease, and many forms of cancer. Recommended strategies for quitting are covered, including working with your doctor to develop a successful  plan.  Culinary   Becoming a Financial trader conducted group or individual video education with verbal and written material and guidebook.  Patient learns that cooking at home can be healthy, cost-effective, quick, and puts them in control. Keys to cooking healthy recipes will include looking at your recipe, assessing your equipment needs, planning ahead, making it simple, choosing cost-effective seasonal ingredients, and limiting the use of added fats, salts, and sugars.  Cooking - Breakfast and Snacks  Clinical staff conducted group or individual video education with verbal and written material and guidebook.  Patient learns how important breakfast is to satiety and nutrition through the entire day. Recommendations include key foods to eat during breakfast to help stabilize blood sugar levels and to prevent overeating at meals later in the day. Planning ahead is also a key component.  Cooking - Human resources officer conducted group or individual video education with verbal and written material and guidebook.  Patient learns eating strategies to improve overall health, including an approach to cook more at home. Recommendations include thinking of animal protein as a side on your plate rather than center stage and focusing instead on lower calorie dense options like vegetables, fruits, whole grains, and plant-based proteins, such as beans. Making sauces in large quantities to freeze for later and leaving the skin on your vegetables are also recommended to maximize your experience.  Cooking - Healthy Salads and Dressing Clinical staff conducted group or individual video education with verbal and written material and guidebook.  Patient learns that vegetables, fruits, whole grains, and legumes are the foundations of the [place]. Recommendations include how to incorporate each of these in flavorful and healthy salads, and how to create homemade salad dressings.  Proper handling of ingredients is also covered. Cooking - Soups and [employer] - Soups and Desserts Clinical staff conducted group or individual video education with verbal and written material and guidebook.  Patient learns that Pritikin soups and desserts make for easy, nutritious, and delicious snacks and meal components that are low in sodium, fat, sugar, and calorie density, while high in vitamins, minerals, and filling fiber. Recommendations include simple and healthy ideas for soups and desserts.   Overview     The Pritikin Solution Program Overview Clinical staff conducted group or individual video education with verbal and written material and guidebook.  Patient learns that the results of the [place] Program have been documented in more than 100 articles published in peer-reviewed journals, and the benefits include reducing risk factors for (and, in some cases, even reversing) high cholesterol, high blood pressure, type 2 diabetes, obesity, and more! An overview of the three key pillars of the Pritikin Program will be covered: eating well, doing regular exercise, and having a healthy mind-set.  WORKSHOPS  Exercise: Exercise Basics: Building Your Action Plan Clinical staff led group instruction and group discussion with PowerPoint presentation and patient guidebook. To enhance the learning environment the use of posters, models and videos may be added. At the conclusion of this workshop, patients will comprehend the difference between physical activity and exercise, as well as the benefits of incorporating both, into their routine. Patients will understand the FITT (Frequency, Intensity, Time, and Type) principle and how to use it to build an exercise action plan. In addition, safety  concerns and other considerations for exercise and cardiac rehab will be addressed by the presenter. The purpose of this lesson is to promote a comprehensive and effective weekly exercise routine in  order to improve patients' overall level of fitness.   Managing Heart Disease: Your Path to a Healthier Heart Clinical staff led group instruction and group discussion with PowerPoint presentation and patient guidebook. To enhance the learning environment the use of posters, models and videos may be added.At the conclusion of this workshop, patients will understand the anatomy and physiology of the heart. Additionally, they will understand how Pritikin's three pillars impact the risk factors, the progression, and the management of heart disease.  The purpose of this lesson is to provide a high-level overview of the heart, heart disease, and how the Pritikin lifestyle positively impacts risk factors.  Exercise Biomechanics Clinical staff led group instruction and group discussion with PowerPoint presentation and patient guidebook. To enhance the learning environment the use of posters, models and videos may be added. Patients will learn how the structural parts of their bodies function and how these functions impact their daily activities, movement, and exercise. Patients will learn how to promote a neutral spine, learn how to manage pain, and identify ways to improve their physical movement in order to promote healthy living. The purpose of this lesson is to expose patients to common physical limitations that impact physical activity. Participants will learn practical ways to adapt and manage aches and pains, and to minimize their effect on regular exercise. Patients will learn how to maintain good posture while sitting, walking, and lifting.  Balance Training and Fall Prevention  Clinical staff led group instruction and group discussion with PowerPoint presentation and patient guidebook. To enhance the learning environment the use of posters, models and videos may be added. At the conclusion of this workshop, patients will understand the importance of their sensorimotor skills (vision,  proprioception, and the vestibular system) in maintaining their ability to balance as they age. Patients will apply a variety of balancing exercises that are appropriate for their current level of function. Patients will understand the common causes for poor balance, possible solutions to these problems, and ways to modify their physical environment in order to minimize their fall risk. The purpose of this lesson is to teach patients about the importance of maintaining balance as they age and ways to minimize their risk of falling.  WORKSHOPS   Nutrition:  Fueling a Scientist, research (physical sciences) led group instruction and group discussion with PowerPoint presentation and patient guidebook. To enhance the learning environment the use of posters, models and videos may be added. Patients will review the foundational principles of the [place] and understand what constitutes a serving size in each of the food groups. Patients will also learn Pritikin-friendly foods that are better choices when away from home and review make-ahead meal and snack options. Calorie density will be reviewed and applied to three nutrition priorities: weight maintenance, weight loss, and weight gain. The purpose of this lesson is to reinforce (in a group setting) the key concepts around what patients are recommended to eat and how to apply these guidelines when away from home by planning and selecting Pritikin-friendly options. Patients will understand how calorie density may be adjusted for different weight management goals.  Mindful Eating  Clinical staff led group instruction and group discussion with PowerPoint presentation and patient guidebook. To enhance the learning environment the use of posters, models and videos may be added. Patients will  briefly review the concepts of the [place] and the importance of low-calorie dense foods. The concept of mindful eating will be introduced as well as the  importance of paying attention to internal hunger signals. Triggers for non-hunger eating and techniques for dealing with triggers will be explored. The purpose of this lesson is to provide patients with the opportunity to review the basic principles of the [place], discuss the value of eating mindfully and how to measure internal cues of hunger and fullness using the Hunger Scale. Patients will also discuss reasons for non-hunger eating and learn strategies to use for controlling emotional eating.  Targeting Your Nutrition Priorities Clinical staff led group instruction and group discussion with PowerPoint presentation and patient guidebook. To enhance the learning environment the use of posters, models and videos may be added. Patients will learn how to determine their genetic susceptibility to disease by reviewing their family history. Patients will gain insight into the importance of diet as part of an overall healthy lifestyle in mitigating the impact of genetics and other environmental insults. The purpose of this lesson is to provide patients with the opportunity to assess their personal nutrition priorities by looking at their family history, their own health history and current risk factors. Patients will also be able to discuss ways of prioritizing and modifying the [place] for their highest risk areas  Menu  Clinical staff led group instruction and group discussion with PowerPoint presentation and patient guidebook. To enhance the learning environment the use of posters, models and videos may be added. Using menus brought in from [employer], or printed from [employer], patients will apply the [place] dining out guidelines that were presented in the [employer] video. Patients will also be able to practice these guidelines in a variety of provided scenarios. The purpose of this lesson is to provide patients with the opportunity to practice  hands-on learning of the [place] with actual menus and practice scenarios.  Label Reading Clinical staff led group instruction and group discussion with PowerPoint presentation and patient guidebook. To enhance the learning environment the use of posters, models and videos may be added. Patients will review and discuss the Pritikin label reading guidelines presented in Pritikin's Label Reading Educational series video. Using fool labels brought in from local grocery stores and markets, patients will apply the label reading guidelines and determine if the packaged food meet the Pritikin guidelines. The purpose of this lesson is to provide patients with the opportunity to review, discuss, and practice hands-on learning of the Pritikin Label Reading guidelines with actual packaged food labels. [place] Workshops are designed to teach patients ways to prepare quick, simple, and affordable recipes at home. The importance of nutrition's role in chronic disease risk reduction is reflected in its emphasis in the overall Pritikin program. By learning how to prepare essential core Pritikin Eating Plan recipes, patients will increase control over what they eat; be able to customize the flavor of foods without the use of added salt, sugar, or fat; and improve the quality of the food they consume. By learning a set of core recipes which are easily assembled, quickly prepared, and affordable, patients are more likely to prepare more healthy foods at home. These workshops focus on convenient breakfasts, simple entres, side dishes, and desserts which can be prepared with minimal effort and are consistent with nutrition recommendations for cardiovascular risk reduction. Cooking [employer] are taught by  a Engineer, materials (RD) who has been trained by the [employer] team. The chef or RD has a clear understanding of the importance of minimizing -  if not completely eliminating - added fat, sugar, and sodium in recipes. Throughout the series of [place] Workshop sessions, patients will learn about healthy ingredients and efficient methods of cooking to build confidence in their capability to prepare    Cooking School weekly topics:  Adding Flavor- Sodium-Free  Fast and Healthy Breakfasts  Powerhouse Plant-Based Proteins  Satisfying Salads and Dressings  Simple Sides and Sauces  International Cuisine-Spotlight on the [place] [employer]  Delicious Desserts  Savory Soups  Efficiency Cooking - Meals in a Snap  Tasty Appetizers and Snacks  Comforting Weekend Breakfasts  One-Pot Wonders   Fast Evening Meals  [facility] (Psychosocial): New Thoughts, New Behaviors Clinical staff led group instruction and group discussion with PowerPoint presentation and patient guidebook. To enhance the learning environment the use of posters, models and videos may be added. Patients will learn and practice techniques for developing effective health and lifestyle goals. Patients will be able to effectively apply the goal setting process learned to develop at least one new personal goal.  The purpose of this lesson is to expose patients to a new skill set of behavior modification techniques such as techniques setting SMART goals, overcoming barriers, and achieving new thoughts and new behaviors.  Managing Moods and Relationships Clinical staff led group instruction and group discussion with PowerPoint presentation and patient guidebook. To enhance the learning environment the use of posters, models and videos may be added. Patients will learn how emotional and chronic stress factors can impact their health and relationships. They will learn healthy ways to manage their moods and utilize positive coping mechanisms. In addition, [facility] patients will learn ways to improve communication skills.  The purpose of this lesson is to expose patients to ways of understanding how one's mood and health are intimately connected. Developing a healthy outlook can help build positive relationships and connections with others. Patients will understand the importance of utilizing effective communication skills that include actively listening and being heard. They will learn and understand the importance of the "4 Cs" and especially Connections in fostering of a Healthy Mind-Set.  Healthy Sleep for a Healthy Heart Clinical staff led group instruction and group discussion with PowerPoint presentation and patient guidebook. To enhance the learning environment the use of posters, models and videos may be added. At the conclusion of this workshop, patients will be able to demonstrate knowledge of the importance of sleep to overall health, well-being, and quality of life. They will understand the symptoms of, and treatments for, common sleep disorders. Patients will also be able to identify daytime and nighttime behaviors which impact sleep, and they will be able to apply these tools to help manage sleep-related challenges. The purpose of this lesson is to provide patients with a general overview of sleep and outline the importance of quality sleep. Patients will learn about a few of the most common sleep disorders. Patients will also be introduced to the concept of "sleep hygiene," and discover ways to self-manage certain sleeping problems through simple daily behavior changes. Finally, the workshop will motivate patients by clarifying the links between quality sleep and their goals of heart-healthy living.   Recognizing and Reducing Stress Clinical staff led group instruction and group discussion with PowerPoint presentation and patient guidebook. To enhance  the learning environment the use of posters, models and videos may be added. At the conclusion of this workshop, patients will be able to understand the types of  stress reactions, differentiate between acute and chronic stress, and recognize the impact that chronic stress has on their health. They will also be able to apply different coping mechanisms, such as reframing negative self-talk. Patients will have the opportunity to practice a variety of stress management techniques, such as deep abdominal breathing, progressive muscle relaxation, and/or guided imagery.  The purpose of this lesson is to educate patients on the role of stress in their lives and to provide healthy techniques for coping with it.  Learning Barriers/Preferences:  Learning Barriers/Preferences - 10/16/21 1205       Learning Barriers/Preferences   Learning Barriers Exercise Concerns   Nathan Barrett sometimes feel lightheaded when he chnages positons   Learning Preferences Skilled Demonstration             Education Topics:  Knowledge Questionnaire Score:  Knowledge Questionnaire Score - 10/16/21 1102       Knowledge Questionnaire Score   Pre Score 19/24             Core Components/Risk Factors/Patient Goals at Admission:  Personal Goals and Risk Factors at Admission - 10/16/21 1041       Core Components/Risk Factors/Patient Goals on Admission    Weight Management Yes;Obesity;Weight Loss    Intervention Weight Management/Obesity: Establish reasonable short term and long term weight goals.;Obesity: Provide education and appropriate resources to help participant work on and attain dietary goals.    Admit Weight 206 lb 9.6 oz (93.7 kg)    Goal Weight: Short Term 196 lb 3.4 oz (89 kg)    Goal Weight: Long Term 191 lb (86.6 kg)    Expected Outcomes Short Term: Continue to assess and modify interventions until short term weight is achieved;Long Term: Adherence to nutrition and physical activity/exercise program aimed toward attainment of established weight goal;Weight Loss: Understanding of general recommendations for a balanced deficit meal plan, which promotes 1-2 lb weight  loss per week and includes a negative energy balance of [phone] kcal/d    Hypertension Yes    Intervention Provide education on lifestyle modifcations including regular physical activity/exercise, weight management, moderate sodium restriction and increased consumption of fresh fruit, vegetables, and low fat dairy, alcohol moderation, and smoking cessation.;Monitor prescription use compliance.    Expected Outcomes Short Term: Continued assessment and intervention until BP is < 140/4m HG in hypertensive participants. < 130/828mHG in hypertensive participants with diabetes, heart failure or chronic kidney disease.;Long Term: Maintenance of blood pressure at goal levels.    Lipids Yes    Intervention Provide education and support for participant on nutrition & aerobic/resistive exercise along with prescribed medications to achieve LDL '70mg'$ , HDL >'40mg'$ .    Expected Outcomes Short Term: Participant states understanding of desired cholesterol values and is compliant with medications prescribed. Participant is following exercise prescription and nutrition guidelines.;Long Term: Cholesterol controlled with medications as prescribed, with individualized exercise RX and with personalized nutrition plan. Value goals: LDL < '70mg'$ , HDL > 40 mg.    Stress Yes    Intervention Offer individual and/or small group education and counseling on adjustment to heart disease, stress management and health-related lifestyle change. Teach and support self-help strategies.;Refer participants experiencing significant psychosocial distress to appropriate mental health specialists for further evaluation and treatment. When possible, include family members and significant others in education/counseling sessions.    Expected Outcomes Short Term:  Participant demonstrates changes in health-related behavior, relaxation and other stress management skills, ability to obtain effective social support, and compliance with psychotropic  medications if prescribed.;Long Term: Emotional wellbeing is indicated by absence of clinically significant psychosocial distress or social isolation.             Core Components/Risk Factors/Patient Goals Review:   Goals and Risk Factor Review     Row Name 10/24/21 1159 10/30/21 1416           Core Components/Risk Factors/Patient Goals Review   Personal Goals Review Weight Management/Obesity;Lipids;Stress;Hypertension Weight Management/Obesity;Lipids;Stress;Hypertension      Review Nathan Barrett started [facility] cardiac rehab on 10/24/21 and did well with exercise. Vital signs were stable. Noted that metoprolol was discontinued Nathan Barrett started [facility]hab on 10/24/21 and is off to a good start to exercise. Vital signs have been stable. Heart rate is improved now that metoprolol has been discontinued .      Expected Outcomes Nathan Barrett will continue to participate in intensive cardiac rehab for exercise, nutrition and lifestyle modifications Nathan Barrett will continue to participate in intensive cardiac rehab for exercise, nutrition and lifestyle modifications               Core Components/Risk Factors/Patient Goals at Discharge (Final Review):   Goals and Risk Factor Review - 10/30/21 1416       Core Components/Risk Factors/Patient Goals Review   Personal Goals Review Weight Management/Obesity;Lipids;Stress;Hypertension    Review Nathan Barrett started [facility]hab on 10/24/21 and is off to a good start to exercise. Vital signs have been stable. Heart rate is improved now that metoprolol has been discontinued .    Expected Outcomes Nathan Barrett will continue to participate in intensive cardiac rehab for exercise, nutrition and lifestyle modifications             ITP Comments:  ITP Comments     Row Name 10/16/21 0857 10/24/21 1123 10/30/21 1411       ITP Comments Medical Director- Dr. Fransico Him, MD, Introduction to [facility]/ Intensive Cardiac rehab. Initial  Orientation Packet Reviewed with the patient 30 Day ITP Review. Keedan started intensive  cardiac rehab on 10/24/21 and did well with exercise 30 Day ITP Review. Kayshawn is off to a great start to exercise at intensive cardiac rehab.              Comments: See ITP comments.Nathan Gave RN BSN

## 2021-10-31 ENCOUNTER — Encounter (HOSPITAL_COMMUNITY)
Admission: RE | Admit: 2021-10-31 | Discharge: 2021-10-31 | Disposition: A | Discharge status: Home / Self Care | Payer: No Typology Code available for payment source | Source: Ambulatory Visit | Attending: Cardiology | Admitting: Cardiology

## 2021-10-31 DIAGNOSIS — Z951 Presence of aortocoronary bypass graft: Secondary | ICD-10-CM

## 2021-11-02 ENCOUNTER — Encounter (HOSPITAL_COMMUNITY)
Admission: RE | Admit: 2021-11-02 | Discharge: 2021-11-02 | Disposition: A | Discharge status: Home / Self Care | Payer: No Typology Code available for payment source | Source: Ambulatory Visit | Attending: Cardiology | Admitting: Cardiology

## 2021-11-02 DIAGNOSIS — Z951 Presence of aortocoronary bypass graft: Secondary | ICD-10-CM

## 2021-11-05 ENCOUNTER — Encounter (HOSPITAL_COMMUNITY)
Admission: RE | Admit: 2021-11-05 | Discharge: 2021-11-05 | Disposition: A | Discharge status: Home / Self Care | Payer: No Typology Code available for payment source | Source: Ambulatory Visit | Attending: Cardiology | Admitting: Cardiology

## 2021-11-05 DIAGNOSIS — Z951 Presence of aortocoronary bypass graft: Secondary | ICD-10-CM

## 2021-11-07 ENCOUNTER — Encounter (HOSPITAL_COMMUNITY)
Admission: RE | Admit: 2021-11-07 | Discharge: 2021-11-07 | Disposition: A | Discharge status: Home / Self Care | Payer: No Typology Code available for payment source | Source: Ambulatory Visit | Attending: Cardiology | Admitting: Cardiology

## 2021-11-07 DIAGNOSIS — Z951 Presence of aortocoronary bypass graft: Secondary | ICD-10-CM

## 2021-11-09 ENCOUNTER — Encounter (HOSPITAL_COMMUNITY)
Admission: RE | Admit: 2021-11-09 | Discharge: 2021-11-09 | Disposition: A | Discharge status: Home / Self Care | Payer: No Typology Code available for payment source | Source: Ambulatory Visit | Attending: Cardiology | Admitting: Cardiology

## 2021-11-09 DIAGNOSIS — Z951 Presence of aortocoronary bypass graft: Secondary | ICD-10-CM

## 2021-11-12 ENCOUNTER — Encounter (HOSPITAL_COMMUNITY)
Admission: RE | Admit: 2021-11-12 | Discharge: 2021-11-12 | Disposition: A | Discharge status: Home / Self Care | Payer: No Typology Code available for payment source | Source: Ambulatory Visit | Attending: Cardiology | Admitting: Cardiology

## 2021-11-12 DIAGNOSIS — Z951 Presence of aortocoronary bypass graft: Secondary | ICD-10-CM | POA: Insufficient documentation

## 2021-11-14 ENCOUNTER — Encounter (HOSPITAL_COMMUNITY)
Admission: RE | Admit: 2021-11-14 | Discharge: 2021-11-14 | Disposition: A | Discharge status: Home / Self Care | Payer: No Typology Code available for payment source | Source: Ambulatory Visit | Attending: Cardiology | Admitting: Cardiology

## 2021-11-14 DIAGNOSIS — Z951 Presence of aortocoronary bypass graft: Secondary | ICD-10-CM

## 2021-11-16 ENCOUNTER — Encounter (HOSPITAL_COMMUNITY): Payer: No Typology Code available for payment source

## 2021-11-19 ENCOUNTER — Encounter (HOSPITAL_COMMUNITY)
Admission: RE | Admit: 2021-11-19 | Discharge: 2021-11-19 | Disposition: A | Discharge status: Home / Self Care | Payer: No Typology Code available for payment source | Source: Ambulatory Visit | Attending: Cardiology | Admitting: Cardiology

## 2021-11-19 DIAGNOSIS — Z951 Presence of aortocoronary bypass graft: Secondary | ICD-10-CM | POA: Diagnosis not present

## 2021-11-20 NOTE — Progress Notes (Signed)
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 Swallow whole., Disp: , Rfl:    atorvastatin (LIPITOR) 80 MG tablet, Take 1 tablet (80 mg total) by mouth every morning. (Patient taking differently: Take 40 mg by mouth every morning.), Disp: 90 tablet, Rfl: 1   Cholecalciferol (VITAMIN D3) 125 MCG (5000 UT) TABS, Take 5,000 Units by mouth daily., Disp: , Rfl:    clopidogrel (PLAVIX) 75 MG tablet, TAKE 1 TABLET BY MOUTH EVERY DAY (Patient taking differently: Take 75 mg by mouth daily.), Disp: 90 tablet, Rfl: 1   diclofenac Sodium (VOLTAREN) 1 % GEL, Apply 2 g topically daily as needed (pIN)., Disp: , Rfl:    losartan-hydrochlorothiazide (HYZAAR) 100-25 MG tablet, Take 1 tablet by mouth daily., Disp: , Rfl:    magnesium oxide (MAG-OX) 400 (240 Mg) MG tablet, Take 400 mg by mouth 3 (three) times a week., Disp: , Rfl:    metoprolol tartrate (LOPRESSOR) 25 MG tablet, Take 25 mg by mouth 2 (two) times daily., Disp: , Rfl:    Multiple Vitamin (MULTIVITAMIN WITH MINERALS) TABS tablet, Take 1 tablet by mouth daily. Centrum Silver  men's 50 +, Disp: , Rfl:    omeprazole (PRILOSEC) 40 MG capsule, Take 40 mg by mouth daily., Disp: , Rfl:    traZODone (DESYREL) 50 MG tablet, Take 100 mg by mouth at bedtime., Disp: , Rfl:   Past Medical History: Past Medical History:  Diagnosis Date   Aortic stenosis    Mild   Bradycardia    CKD (chronic kidney disease), stage II    Coronary artery disease    a. multivessel, treated medically.   H. pylori infection    Hypercholesterolemia    Hypertension     Tobacco Use: Social History   Tobacco Use  Smoking Status Never  Smokeless Tobacco Never    Labs: Review Flowsheet  More data exists      Latest Ref Rng & Units 03/15/2012 03/30/2015 07/12/2016 10/22/2018 07/31/2020  Labs for ITP Cardiac and Pulmonary Rehab  Cholestrol 100 - 199 mg/dL - 137  120  126  -  LDL (calc) 0 - 99 mg/dL - 84  66  73  -  HDL-C >39 mg/dL - 37  40  35  -  Trlycerides 0 - 149 mg/dL - 78  70  97  -  TCO2 22 - 32 mmol/L 27  - - - 24     Capillary Blood Glucose: Lab Results  Component Value Date   GLUCAP 116 (H) 12/20/2018   GLUCAP 124 (H) 03/15/2012  Exercise Target Goals: Exercise Program Goal: Individual exercise prescription set using results from initial 6 min walk test and THRR while considering  patient's activity barriers and safety.   Exercise Prescription Goal: Initial exercise prescription builds to 30-45 minutes a day of aerobic activity, 2-3 days per week.  Home exercise guidelines will be given to patient during program as part of exercise prescription that the participant will acknowledge.  Activity Barriers & Risk Stratification:  Activity Barriers & Cardiac Risk Stratification - 10/16/21 0938       Activity Barriers & Cardiac Risk Stratification   Activity Barriers Other (comment)    Comments Needs left shoulder surgery. Bilateral knee pain, gets gel injections every 6 months, needs knee replacement.    Cardiac Risk Stratification Moderate             6 Minute  Walk:  6 Minute Walk     Row Name 10/16/21 0927         6 Minute Walk   Phase Initial     Distance 1675 feet     Walk Time 6 minutes     # of Rest Breaks 0     MPH 3.17     METS 3.63     RPE 11     Perceived Dyspnea  1     VO2 Peak 12.69     Symptoms Yes (comment)     Comments Mild SOB, RPD=1     Resting HR 52 bpm     Resting BP 122/84     Resting Oxygen Saturation  97 %     Exercise Oxygen Saturation  during 6 min walk 100 %     Max Ex. HR 73 bpm     Max Ex. BP 120/80     2 Minute Post BP 112/80              Oxygen Initial Assessment:   Oxygen Re-Evaluation:   Oxygen Discharge (Final Oxygen Re-Evaluation):   Initial Exercise Prescription:  Initial Exercise Prescription - 10/16/21 1000       Date of Initial Exercise RX and Referring Provider   Date 10/16/21    Referring Provider Filbert Berthold, MD   Sueanne Margarita, MD (covering)   Expected Discharge Date 12/14/21      Bike   Level 2.5    Minutes 15    METs 3.6      NuStep   Level 3    SPM 85    Minutes 15    METs 3      Prescription Details   Frequency (times per week) 3    Duration Progress to 30 minutes of continuous aerobic without signs/symptoms of physical distress      Intensity   THRR 40-80% of Max Heartrate 65-131    Ratings of Perceived Exertion 11-13    Perceived Dyspnea 0-4      Progression   Progression Continue to progress workloads to maintain intensity without signs/symptoms of physical distress.      Resistance Training   Training Prescription Yes    Weight 5 lbs    Reps 10-15             Perform Capillary Blood Glucose checks as needed.  Exercise Prescription Changes:   Exercise Prescription Changes     Row Name 10/24/21 1100 11/02/21 1000 11/07/21 1100         Response to Exercise   Blood Pressure (Admit) 128/72 110/70 110/72     Blood  Pressure (Exercise) 142/82 142/74 142/80     Blood Pressure (Exit) 100/72 110/78 108/62     Heart Rate (Admit) 74 bpm  71 bpm 56 bpm     Heart Rate (Exercise) 121 bpm 121 bpm 132 bpm     Heart Rate (Exit) 69 bpm 74 bpm 67 bpm     Rating of Perceived Exertion (Exercise) '11 11 13     '$ Symptoms None None None     Comments Pt's first day in the CRP2 program Reviewed home exercise Rx Reviewed METs     Duration Continue with 30 min of aerobic exercise without signs/symptoms of physical distress. Continue with 30 min of aerobic exercise without signs/symptoms of physical distress. Continue with 30 min of aerobic exercise without signs/symptoms of physical distress.     Intensity THRR unchanged THRR unchanged THRR unchanged       Progression   Progression Continue to progress workloads to maintain intensity without signs/symptoms of physical distress. Continue to progress workloads to maintain intensity without signs/symptoms of physical distress. Continue to progress workloads to maintain intensity without signs/symptoms of physical distress.     Average METs 3.85 4.25 4.35       Resistance Training   Training Prescription No Yes No     Weight No weights on Wednesdays 5 lb No weights on Wednesday     Reps -- 10-15 --     Time -- 10 Minutes --       Interval Training   Interval Training -- No No       Bike   Level 2.5 2.5 --     Minutes 15 15 --     METs 4.3 4.4 --       Recumbant Bike   Level -- -- 2.5     Minutes -- -- 15     METs -- -- 3.7       NuStep   Level '3 3 4     '$ Minutes '15 15 15     '$ METs 3.4 4.1 5       Home Exercise Plan   Plans to continue exercise at -- Home (comment) Home (comment)     Frequency -- Add 3 additional days to program exercise sessions. Add 3 additional days to program exercise sessions.     Initial Home Exercises Provided -- 11/02/21 11/02/21              Exercise Comments:   Exercise Comments     Row Name 10/24/21 1149 11/02/21 1058 11/07/21 1156       Exercise Comments Pt's first day in the CRP2 program. Pt exercised with no complaints. Pt voices that he  has started walking at home 2-3 x/week for 40 minutes. Therefore, reviewed home exercise Rx for safe exercise at home. Pt verbalized understanding of home exercise Rx and was provided a copy. Reviewed METs. Pt increased workload on Nustep today with no complaints.              Exercise Goals and Review:   Exercise Goals     Row Name 10/16/21 0913             Exercise Goals   Increase Physical Activity Yes       Intervention Provide advice, education, support and counseling about physical activity/exercise needs.;Develop an individualized exercise prescription for aerobic and resistive training based on initial evaluation findings, risk stratification, comorbidities and participant's personal goals.       Expected Outcomes Short Term:  Attend rehab on a regular basis to increase amount of physical activity.;Long Term: Exercising regularly at least 3-5 days a week.;Long Term: Add in home exercise to make exercise part of routine and to increase amount of physical activity.       Increase Strength and Stamina Yes       Intervention Provide advice, education, support and counseling about physical activity/exercise needs.;Develop an individualized exercise prescription for aerobic and resistive training based on initial evaluation findings, risk stratification, comorbidities and participant's personal goals.       Expected Outcomes Short Term: Increase workloads from initial exercise prescription for resistance, speed, and METs.;Short Term: Perform resistance training exercises routinely during rehab and add in resistance training at home;Long Term: Improve cardiorespiratory fitness, muscular endurance and strength as measured by increased METs and functional capacity (6MWT)       Able to understand and use rate of perceived exertion (RPE) scale Yes       Intervention Provide education and explanation on how to use RPE scale       Expected Outcomes Short Term: Able to use RPE daily in rehab to  express subjective intensity level;Long Term:  Able to use RPE to guide intensity level when exercising independently       Knowledge and understanding of Target Heart Rate Range (THRR) Yes       Intervention Provide education and explanation of THRR including how the numbers were predicted and where they are located for reference       Expected Outcomes Short Term: Able to state/look up THRR;Long Term: Able to use THRR to govern intensity when exercising independently;Short Term: Able to use daily as guideline for intensity in rehab       Able to check pulse independently Yes       Intervention Provide education and demonstration on how to check pulse in carotid and radial arteries.;Review the importance of being able to check your own pulse for safety during independent exercise       Expected Outcomes Short Term: Able to explain why pulse checking is important during independent exercise;Long Term: Able to check pulse independently and accurately       Understanding of Exercise Prescription Yes       Intervention Provide education, explanation, and written materials on patient's individual exercise prescription       Expected Outcomes Short Term: Able to explain program exercise prescription;Long Term: Able to explain home exercise prescription to exercise independently                Exercise Goals Re-Evaluation :  Exercise Goals Re-Evaluation     Row Name 10/24/21 1146             Exercise Goal Re-Evaluation   Exercise Goals Review Increase Physical Activity;Increase Strength and Stamina;Able to understand and use rate of perceived exertion (RPE) scale;Knowledge and understanding of Target Heart Rate Range (THRR);Understanding of Exercise Prescription       Comments Pt's first day in the CRP2 program, Pt understands the exercise Rx, RPE scale, THRR.       Expected Outcomes Will continue to monitor patient and progress exercise workloads as tolerated.                Discharge  Exercise Prescription (Final Exercise Prescription Changes):  Exercise Prescription Changes - 11/07/21 1100       Response to Exercise   Blood Pressure (Admit) 110/72    Blood Pressure (Exercise) 142/80    Blood Pressure (Exit)  108/62    Heart Rate (Admit) 56 bpm    Heart Rate (Exercise) 132 bpm    Heart Rate (Exit) 67 bpm    Rating of Perceived Exertion (Exercise) 13    Symptoms None    Comments Reviewed METs    Duration Continue with 30 min of aerobic exercise without signs/symptoms of physical distress.    Intensity THRR unchanged      Progression   Progression Continue to progress workloads to maintain intensity without signs/symptoms of physical distress.    Average METs 4.35      Resistance Training   Training Prescription No    Weight No weights on Wednesday      Interval Training   Interval Training No      Recumbant Bike   Level 2.5    Minutes 15    METs 3.7      NuStep   Level 4    Minutes 15    METs 5      Home Exercise Plan   Plans to continue exercise at Home (comment)    Frequency Add 3 additional days to program exercise sessions.    Initial Home Exercises Provided 11/02/21             Nutrition:  Target Goals: Understanding of nutrition guidelines, daily intake of sodium '1500mg'$ , cholesterol '200mg'$ , calories 30% from fat and 7% or less from saturated fats, daily to have 5 or more servings of fruits and vegetables.  Biometrics:  Pre Biometrics - 10/16/21 0857       Pre Biometrics   Waist Circumference 44 inches    Hip Circumference 41.75 inches    Waist to Hip Ratio 1.05 %    Triceps Skinfold 21 mm    % Body Fat 32.6 %    Grip Strength 44 kg    Flexibility 10.75 in    Single Leg Stand 17.12 seconds              Nutrition Therapy Plan and Nutrition Goals:  Nutrition Therapy & Goals - 11/19/21 0941       Nutrition Therapy   Diet Heart healthy diet    Drug/Food Interactions Statins/Certain Fruits      Personal Nutrition  Goals   Nutrition Goal Patient to identify strategies for managing cardiovascular risk by attending the weekly [facility] education and nutrition classes    Personal Goal #2 Patient to limit to '1500mg'$  of sodium    Personal Goal #3 Patient to identify food sources and limit daily intake of saturated fat, trans fat, and sodium    Comments Goals in action. Horace remains extremely motivated to make lifestyle changes to aid with heart health and blood sugar control. He regularly attends the [facility] education and nutrition series. He has increased high fiber foods and reduced saturated fats. He is reading food labels for sugar and sodium. He reports improved satiety throughout the day since increasing fiber and reduced overall snacking on refined carbohydrates. He is down 4.8# since starting with our program.      Intervention Plan   Intervention Nutrition handout(s) given to patient.;Prescribe, educate and counsel regarding individualized specific dietary modifications aiming towards targeted core components such as weight, hypertension, lipid management, diabetes, heart failure and other comorbidities.    Expected Outcomes Short Term Goal: Understand basic principles of dietary content, such as calories, fat, sodium, cholesterol and nutrients.;Long Term Goal: Adherence to prescribed nutrition plan.             Nutrition  Assessments:  Nutrition Assessments - 10/24/21 1055       Rate Your Plate Scores   Pre Score 53            MEDIFICTS Score Key: ?70 Need to make dietary changes  40-70 Heart Healthy Diet ? 40 Therapeutic Level Cholesterol Diet   Flowsheet Row INTENSIVE CARDIAC REHAB from 10/24/2021 in [place]  Picture Your Plate Total Score on Admission 53      Picture Your Plate Scores: <16 Unhealthy dietary pattern with much room for improvement. 41-50 Dietary pattern unlikely to meet recommendations for good health and room for  improvement. 51-60 More healthful dietary pattern, with some room for improvement.  >60 Healthy dietary pattern, although there may be some specific behaviors that could be improved.    Nutrition Goals Re-Evaluation:  Nutrition Goals Re-Evaluation     [place] Name 10/24/21 1050 11/19/21 0941           Goals   Current Weight 209 lb 10.5 oz (95.1 kg) 201 lb 11.5 oz (91.5 kg)      Comment Lipid panel WNL, A1c 5.6 No new labs at this time, however, most recent labs show A1c improved to 5.6, HDL 38, LDL 98, RBC 4.12, hemoglobin 11.4, hematocrit 33.1      Expected Outcome Patient reports motivation to increase fiber through fruit and vegetable intake and decrease mindless snacking type behaviors. A1c has improved from 5.9 to 5.6. Goals in action. Lavoris remains extremely motivated to make lifestyle changes to aid with heart health and blood sugar control. He regularly attends the [facility] education and nutrition series. He has increased high fiber foods and reduced saturated fats. He is reading food labels for sugar and sodium. He reports improved satiety throughout the day since increasing fiber and reduced overall snacking on refined carbohydrates. He is down 4.8# since starting with our program.               Nutrition Goals Re-Evaluation:  Nutrition Goals Re-Evaluation     [place] Name 10/24/21 1050 11/19/21 0941           Goals   Current Weight 209 lb 10.5 oz (95.1 kg) 201 lb 11.5 oz (91.5 kg)      Comment Lipid panel WNL, A1c 5.6 No new labs at this time, however, most recent labs show A1c improved to 5.6, HDL 38, LDL 98, RBC 4.12, hemoglobin 11.4, hematocrit 33.1      Expected Outcome Patient reports motivation to increase fiber through fruit and vegetable intake and decrease mindless snacking type behaviors. A1c has improved from 5.9 to 5.6. Goals in action. Masin remains extremely motivated to make lifestyle changes to aid with heart health and blood sugar control. He regularly attends  the [facility] education and nutrition series. He has increased high fiber foods and reduced saturated fats. He is reading food labels for sugar and sodium. He reports improved satiety throughout the day since increasing fiber and reduced overall snacking on refined carbohydrates. He is down 4.8# since starting with our program.               Nutrition Goals Discharge (Final Nutrition Goals Re-Evaluation):  Nutrition Goals Re-Evaluation - 11/19/21 0941       Goals   Current Weight 201 lb 11.5 oz (91.5 kg)    Comment No new labs at this time, however, most recent labs show A1c improved to 5.6, HDL 38, LDL 98, RBC 4.12, hemoglobin 11.4, hematocrit 33.1  Expected Outcome Goals in action. Goodwin remains extremely motivated to make lifestyle changes to aid with heart health and blood sugar control. He regularly attends the [facility] education and nutrition series. He has increased high fiber foods and reduced saturated fats. He is reading food labels for sugar and sodium. He reports improved satiety throughout the day since increasing fiber and reduced overall snacking on refined carbohydrates. He is down 4.8# since starting with our program.             Psychosocial: Target Goals: Acknowledge presence or absence of significant depression and/or stress, maximize coping skills, provide positive support system. Participant is able to verbalize types and ability to use techniques and skills needed for reducing stress and depression.  Initial Review & Psychosocial Screening:  Initial Psych Review & Screening - 10/16/21 1202       Initial Review   Current issues with Current Depression;History of Depression;Current Sleep Concerns;Current Stress Concerns    Source of Stress Concerns Chronic Illness;Poor Coping Skills    Comments Shondell admits he is depressed. he is reciving coun selling from the [place]? 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 Keyshon receives therapy every other week which he finds helpful.      Barriers   Psychosocial barriers to participate in program The patient should benefit from training in stress management and relaxation.      Screening Interventions   Interventions Encouraged to exercise;Provide feedback about the scores to participant;To provide support and resources with identified psychosocial needs    Expected Outcomes Long Term Goal: Stressors or current issues are controlled or eliminated.;Short Term goal: Identification and review with participant of any Quality of Life or Depression concerns found by scoring the questionnaire.;Long Term goal: The participant improves quality of Life and PHQ9 Scores as seen by post scores and/or verbalization of changes             Quality of Life Scores:  Quality of Life - 10/16/21 1102       Quality of Life   Select Quality of Life      Quality of Life Scores   Health/Function Pre 13.93 %    Socioeconomic Pre 19.58 %    Psych/Spiritual Pre 16.64 %    Family Pre 22.8 %    GLOBAL Pre 16.88 %            Scores of 19 and below usually indicate a poorer quality of life in these areas.  A difference of  2-3 points is a clinically meaningful difference.  A difference of 2-3 points in the total score of the Quality of Life Index has been associated with significant improvement in overall quality of life, self-image, physical symptoms, and general health in studies assessing change in quality of life.  PHQ-9: Review Flowsheet       10/16/2021 09/05/2014 05/18/2014  Depression screen PHQ 2/9  Decreased Interest 3 0 0  Down, Depressed, Hopeless 2 0 0  PHQ - 2 Score 5 0 0  Altered sleeping 3 - -  Tired, decreased energy 1 - -  Change in appetite 0 - -  Feeling bad or failure about yourself  2 - -  Trouble concentrating 2 - -  Moving slowly or fidgety/restless 0 - -  Suicidal thoughts 0 - -   PHQ-9 Score 13 - -  Difficult doing work/chores Very difficult - -   Interpretation of Total Score  Total Score Depression Severity:  1-4 = Minimal depression, 5-9 = Mild depression, 10-14 = Moderate depression, 15-19 = Moderately severe depression, 20-27 = Severe depression   Psychosocial Evaluation and Intervention:   Psychosocial Re-Evaluation:  Psychosocial Re-Evaluation     [place] Name 10/24/21 1124 11/20/21 1636           Psychosocial Re-Evaluation   Current issues with Current Stress Concerns;Current Sleep Concerns;History of Depression;Current Depression Current Stress Concerns;Current Sleep Concerns;History of Depression;Current Depression      Comments Seaver is currently depressed. Euclid is receving counselling biweekly as he aslo suffers from PTSD. Quality of life questionnaire reviewed. Yousef says his depression and PTSD is controlled on medication Daren is currently depressed. Carter is receving counselling biweekly as he aslo suffers from PTSD. Zanden says his depression and PTSD is controlled on medication. Zacherie has not voiced having increased depression since participating in cardiac rehab.      Expected Outcomes Jakell will have decreased or controlled  depression upon completion of intensive cardiac rehab. Mikyle will have decreased or controlled  depression upon completion of intensive cardiac rehab.      Interventions Encouraged to attend Cardiac Rehabilitation for the exercise;Stress management education;Relaxation education Encouraged to attend Cardiac Rehabilitation for the exercise;Stress management education;Relaxation education      Continue Psychosocial Services  Follow up required by staff Follow up required by staff        Initial Review   Source of Stress Concerns Chronic Illness;Retirement/disability;Poor Coping Skills Chronic Illness;Retirement/disability;Poor Coping Skills      Comments Will continue to monitor and offer support as needed Will continue to  monitor and offer support as needed               Psychosocial Discharge (Final Psychosocial Re-Evaluation):  Psychosocial Re-Evaluation - 11/20/21 1636       Psychosocial Re-Evaluation   Current issues with Current Stress Concerns;Current Sleep Concerns;History of Depression;Current Depression    Comments Dallas is currently depressed. Jadore is receving counselling biweekly as he aslo suffers from PTSD. Renald says his depression and PTSD is controlled on medication. Alucard has not voiced having increased depression since participating in cardiac rehab.    Expected Outcomes Magnus will have decreased or controlled  depression upon completion of intensive cardiac rehab.    Interventions Encouraged to attend Cardiac Rehabilitation for the exercise;Stress management education;Relaxation education    Continue Psychosocial Services  Follow up required by staff      Initial Review   Source of Stress Concerns Chronic Illness;Retirement/disability;Poor Coping Skills    Comments Will continue to monitor and offer support as needed             Vocational Rehabilitation: Provide vocational rehab assistance to qualifying candidates.   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            Education: Education Goals: Education classes will be provided on a weekly basis, covering required topics. Participant will state understanding/return demonstration of topics presented.    Education     Row Name 10/24/21 0900     Education   Cardiac Education Topics Pritikin   [employer]   Risk analyst   Instruction Review Code 1- Verbalizes Understanding   Class Start Time [phone]   Class Stop  Time [phone]   Class Time Calculation (min) 31 min    [place] Name 10/26/21 0900     Education   Cardiac Education Topics Pritikin   [employer]     Workshops   Educator Exercise Physiologist   [facility] Psychosocial   Psychosocial Workshop Managing Moods and Relationships   Instruction Review Code 1- Verbalizes Understanding   Class Start Time 0810   Class Stop Time 0851   Class Time Calculation (min) 41 min    [place] Name 10/29/21 0900     Education   Cardiac Education Topics [place]   [facility] Workshops     Workshops   Educator Exercise Physiologist   [facility] Exercise   Exercise Workshop Exercise Basics: Building Your Action Plan   Instruction Review Code 1- Verbalizes Understanding   Class Start Time [phone]   Class Stop Time 0855   Class Time Calculation (min) 43 min    [place] Name 10/31/21 0900     Education   Cardiac Education Topics [employer]   Educator Dietitian   Weekly Topic Efficiency Cooking - Meals in a Snap   Instruction Review Code 1- Verbalizes Understanding   Class Start Time 0809   Class Stop Time 0844   Class Time Calculation (min) 35 min    [place] Name 11/02/21 0900     Education   Cardiac Education Topics Pritikin   Academic librarian Exercise Education   Exercise Education Move It!   Instruction Review Code 1- Verbalizes Understanding   Class Start Time 0815   Class Stop Time 0853   Class Time Calculation (min) 38 min    Row Name 11/05/21 0900     Education   Cardiac Education Topics Pritikin   [facility] Core Videos     Core Videos   Educator Dietitian   Select Nutrition   Nutrition Nutrition Action Plan   Instruction Review Code 1- Verbalizes Understanding   Class Start Time 0815   Class Stop Time [phone]   Class Time Calculation (min) 37 min    [place] Name 11/07/21 0900     Education   Cardiac Education Topics [employer]   Educator Dietitian   Weekly Topic Simple Sides and Sauces   Instruction Review Code 1- Verbalizes Understanding   Class Start Time 0809   Class Stop Time 0840   Class Time Calculation (min) 31 min    [place] Name 11/09/21 0900     Education   Cardiac Education Topics Pritikin   [facility] Core Videos     Core Videos   Educator Nurse   Select General Education   General Education Hypertension and Heart Disease   Instruction Review Code 1- Verbalizes Understanding   Class Start Time 0815   Class Stop Time 0850   Class Time Calculation (min) 35 min    [place] Name 11/12/21 0900     Education   Cardiac Education Topics Pritikin   Lexicographer Nutrition   Nutrition Dining Out - Part 1   Instruction Review Code 1- Verbalizes Understanding  Class Start Time 0815   Class Stop Time 0850   Class Time Calculation (min) 35 min    [place] Name 11/14/21 0900     Education   Cardiac Education Topics [employer]   Educator Dietitian   Weekly Topic One-Pot Wonders   Instruction Review Code 1- Verbalizes Understanding   Class Start Time [phone]   Class Stop Time 0845   Class Time Calculation (min) 35 min    [place] Name 11/19/21 0900     Education   Cardiac Education Topics Pritikin   [facility] Core Videos     Core Videos   Educator Exercise Physiologist   Select Exercise Education   Exercise Education Biomechanial Limitations   Instruction Review Code 1- Verbalizes Understanding   Class Start Time 0815   Class Stop Time 0900   Class Time Calculation (min) 45 min            Core Videos: Exercise    Move It!  Clinical staff conducted group or individual video education with verbal and written material and guidebook.  Patient learns the recommended Pritikin exercise program. Exercise with the goal of living a long, healthy life. Some of the health benefits of  exercise include controlled diabetes, healthier blood pressure levels, improved cholesterol levels, improved heart and lung capacity, improved sleep, and better body composition. Everyone should speak with their doctor before starting or changing an exercise routine.  Biomechanical Limitations Clinical staff conducted group or individual video education with verbal and written material and guidebook.  Patient learns how biomechanical limitations can impact exercise and how we can mitigate and possibly overcome limitations to have an impactful and balanced exercise routine.  Body Composition Clinical staff conducted group or individual video education with verbal and written material and guidebook.  Patient learns that body composition (ratio of muscle mass to fat mass) is a key component to assessing overall fitness, rather than body weight alone. Increased fat mass, especially visceral belly fat, can put [place] at increased risk for metabolic syndrome, type 2 diabetes, heart disease, and even death. It is recommended to combine diet and exercise (cardiovascular and resistance training) to improve your body composition. Seek guidance from your physician and exercise physiologist before implementing an exercise routine.  Exercise Action Plan Clinical staff conducted group or individual video education with verbal and written material and guidebook.  Patient learns the recommended strategies to achieve and enjoy long-term exercise adherence, including variety, self-motivation, self-efficacy, and positive decision making. Benefits of exercise include fitness, good health, weight management, more energy, better sleep, less stress, and overall well-being.  Medical   Heart Disease Risk Reduction Clinical staff conducted group or individual video education with verbal and written material and guidebook.  Patient learns our heart is our most vital organ as it circulates oxygen, nutrients, white blood cells, and  hormones throughout the entire body, and carries waste away. Data supports a plant-based eating plan like the Pritikin Program for its effectiveness in slowing progression of and reversing heart disease. The video provides a number of recommendations to address heart disease.   Metabolic Syndrome and Belly Fat  Clinical staff conducted group or individual video education with verbal and written material and guidebook.  Patient learns what metabolic syndrome is, how it leads to heart disease, and how one can reverse it and keep it from coming back. You have metabolic syndrome if you have 3 of the following 5 criteria: abdominal obesity,  high blood pressure, high triglycerides, low HDL cholesterol, and high blood sugar.  Hypertension and Heart Disease Clinical staff conducted group or individual video education with verbal and written material and guidebook.  Patient learns that high blood pressure, or hypertension, is very common in the [place]. Hypertension is largely due to excessive salt intake, but other important risk factors include being overweight, physical inactivity, drinking too much alcohol, smoking, and not eating enough potassium from fruits and vegetables. High blood pressure is a leading risk factor for heart attack, stroke, congestive heart failure, dementia, kidney failure, and premature death. Long-term effects of excessive salt intake include stiffening of the arteries and thickening of heart muscle and organ damage. Recommendations include ways to reduce hypertension and the risk of heart disease.  Diseases of Our Time - Focusing on Diabetes Clinical staff conducted group or individual video education with verbal and written material and guidebook.  Patient learns why the best way to stop diseases of our time is prevention, through food and other lifestyle changes. Medicine (such as prescription pills and surgeries) is often only a Band-Aid on the problem, not a long-term  solution. Most common diseases of our time include obesity, type 2 diabetes, hypertension, heart disease, and cancer. The Pritikin Program is recommended and has been proven to help reduce, reverse, and/or prevent the damaging effects of metabolic syndrome.  Nutrition   Overview of the Pritikin Eating Plan  Clinical staff conducted group or individual video education with verbal and written material and guidebook.  Patient learns about the [place] for disease risk reduction. The [place] emphasizes a wide variety of unrefined, minimally-processed carbohydrates, like fruits, vegetables, whole grains, and legumes. Go, Caution, and Stop food choices are explained. Plant-based and lean animal proteins are emphasized. Rationale provided for low sodium intake for blood pressure control, low added sugars for blood sugar stabilization, and low added fats and oils for coronary artery disease risk reduction and weight management.  Calorie Density  Clinical staff conducted group or individual video education with verbal and written material and guidebook.  Patient learns about calorie density and how it impacts the Pritikin Eating Plan. Knowing the characteristics of the food you choose will help you decide whether those foods will lead to weight gain or weight loss, and whether you want to consume more or less of them. Weight loss is usually a side effect of the Pritikin Eating Plan because of its focus on low calorie-dense foods.  Label Reading  Clinical staff conducted group or individual video education with verbal and written material and guidebook.  Patient learns about the Pritikin recommended label reading guidelines and corresponding recommendations regarding calorie density, added sugars, sodium content, and whole grains.  Dining Out - Part 1  Clinical staff conducted group or individual video education with verbal and written material and guidebook.  Patient learns that  restaurant meals can be sabotaging because they can be so high in calories, fat, sodium, and/or sugar. Patient learns recommended strategies on how to positively address this and avoid unhealthy pitfalls.  Facts on Fats  Clinical staff conducted group or individual video education with verbal and written material and guidebook.  Patient learns that lifestyle modifications can be just as effective, if not more so, as many medications for lowering your risk of heart disease. A Pritikin lifestyle can help to reduce your risk of inflammation and atherosclerosis (cholesterol build-up, or plaque, in the artery walls). Lifestyle interventions such as dietary choices and physical activity  address the cause of atherosclerosis. A review of the types of fats and their impact on blood cholesterol levels, along with dietary recommendations to reduce fat intake is also included.  Nutrition Action Plan  Clinical staff conducted group or individual video education with verbal and written material and guidebook.  Patient learns how to incorporate Pritikin recommendations into their lifestyle. Recommendations include planning and keeping personal health goals in mind as an important part of their success.  Healthy Mind-Set    Healthy Minds, Bodies, Hearts  Clinical staff conducted group or individual video education with verbal and written material and guidebook.  Patient learns how to identify when they are stressed. Video will discuss the impact of that stress, as well as the many benefits of stress management. Patient will also be introduced to stress management techniques. The way we think, act, and feel has an impact on our hearts.  How Our Thoughts Can Heal Our Hearts  Clinical staff conducted group or individual video education with verbal and written material and guidebook.  Patient learns that negative thoughts can cause depression and anxiety. This can result in negative lifestyle behavior and serious  health problems. Cognitive behavioral therapy is an effective method to help control our thoughts in order to change and improve our emotional outlook.  Additional Videos:  Exercise    Improving Performance  Clinical staff conducted group or individual video education with verbal and written material and guidebook.  Patient learns to use a non-linear approach by alternating intensity levels and lengths of time spent exercising to help burn more calories and lose more body fat. Cardiovascular exercise helps improve heart health, metabolism, hormonal balance, blood sugar control, and recovery from fatigue. Resistance training improves strength, endurance, balance, coordination, reaction time, metabolism, and muscle mass. Flexibility exercise improves circulation, posture, and balance. Seek guidance from your physician and exercise physiologist before implementing an exercise routine and learn your capabilities and proper form for all exercise.  Introduction to Yoga  Clinical staff conducted group or individual video education with verbal and written material and guidebook.  Patient learns about yoga, a discipline of the coming together of mind, breath, and body. The benefits of yoga include improved flexibility, improved range of motion, better posture and core strength, increased lung function, weight loss, and positive self-image. Yoga's heart health benefits include lowered blood pressure, healthier heart rate, decreased cholesterol and triglyceride levels, improved immune function, and reduced stress. Seek guidance from your physician and exercise physiologist before implementing an exercise routine and learn your capabilities and proper form for all exercise.  Medical   Aging: Enhancing Your Quality of Life  Clinical staff conducted group or individual video education with verbal and written material and guidebook.  Patient learns key strategies and recommendations to stay in good physical health  and enhance quality of life, such as prevention strategies, having an advocate, securing a [place], and keeping a list of medications and system for tracking them. It also discusses how to avoid risk for bone loss.  Biology of Weight Control  Clinical staff conducted group or individual video education with verbal and written material and guidebook.  Patient learns that weight gain occurs because we consume more calories than we burn (eating more, moving less). Even if your body weight is normal, you may have higher ratios of fat compared to muscle mass. Too much body fat puts you at increased risk for cardiovascular disease, heart attack, stroke, type 2 diabetes, and obesity-related cancers.  In addition to exercise, following the [place] can help reduce your risk.  Decoding Lab Results  Clinical staff conducted group or individual video education with verbal and written material and guidebook.  Patient learns that lab test reflects one measurement whose values change over time and are influenced by many factors, including medication, stress, sleep, exercise, food, hydration, pre-existing medical conditions, and more. It is recommended to use the knowledge from this video to become more involved with your lab results and evaluate your numbers to speak with your doctor.   Diseases of Our Time - Overview  Clinical staff conducted group or individual video education with verbal and written material and guidebook.  Patient learns that according to the CDC, 50% to 70% of chronic diseases (such as obesity, type 2 diabetes, elevated lipids, hypertension, and heart disease) are avoidable through lifestyle improvements including healthier food choices, listening to satiety cues, and increased physical activity.  Sleep Disorders Clinical staff conducted group or individual video education with verbal and written material and guidebook.  Patient learns how good  quality and duration of sleep are important to overall health and well-being. Patient also learns about sleep disorders and how they impact health along with recommendations to address them, including discussing with a physician.  Nutrition  Dining Out - Part 2 Clinical staff conducted group or individual video education with verbal and written material and guidebook.  Patient learns how to plan ahead and communicate in order to maximize their dining experience in a healthy and nutritious manner. Included are recommended food choices based on the type of restaurant the patient is visiting.   Fueling a Best boy conducted group or individual video education with verbal and written material and guidebook.  There is a strong connection between our food choices and our health. Diseases like obesity and type 2 diabetes are very prevalent and are in large-part due to lifestyle choices. The Pritikin Eating Plan provides plenty of food and hunger-curbing satisfaction. It is easy to follow, affordable, and helps reduce health risks.  Menu Workshop  Clinical staff conducted group or individual video education with verbal and written material and guidebook.  Patient learns that restaurant meals can sabotage health goals because they are often packed with calories, fat, sodium, and sugar. Recommendations include strategies to plan ahead and to communicate with the manager, chef, or server to help order a healthier meal.  Planning Your Eating Strategy  Clinical staff conducted group or individual video education with verbal and written material and guidebook.  Patient learns about the [place] and its benefit of reducing the risk of disease. The [place] does not focus on calories. Instead, it emphasizes high-quality, nutrient-rich foods. By knowing the characteristics of the foods, we choose, we can determine their calorie density and make informed  decisions.  Targeting Your Nutrition Priorities  Clinical staff conducted group or individual video education with verbal and written material and guidebook.  Patient learns that lifestyle habits have a tremendous impact on disease risk and progression. This video provides eating and physical activity recommendations based on your personal health goals, such as reducing LDL cholesterol, losing weight, preventing or controlling type 2 diabetes, and reducing high blood pressure.  Vitamins and Minerals  Clinical staff conducted group or individual video education with verbal and written material and guidebook.  Patient learns different ways to obtain key vitamins and minerals, including through a recommended healthy diet. It is important to discuss all supplements you  take with your doctor.   Healthy Mind-Set    Smoking Cessation  Clinical staff conducted group or individual video education with verbal and written material and guidebook.  Patient learns that cigarette smoking and tobacco addiction pose a serious health risk which affects millions of people. Stopping smoking will significantly reduce the risk of heart disease, lung disease, and many forms of cancer. Recommended strategies for quitting are covered, including working with your doctor to develop a successful plan.  Culinary   Becoming a Financial trader conducted group or individual video education with verbal and written material and guidebook.  Patient learns that cooking at home can be healthy, cost-effective, quick, and puts them in control. Keys to cooking healthy recipes will include looking at your recipe, assessing your equipment needs, planning ahead, making it simple, choosing cost-effective seasonal ingredients, and limiting the use of added fats, salts, and sugars.  Cooking - Breakfast and Snacks  Clinical staff conducted group or individual video education with verbal and written material and guidebook.   Patient learns how important breakfast is to satiety and nutrition through the entire day. Recommendations include key foods to eat during breakfast to help stabilize blood sugar levels and to prevent overeating at meals later in the day. Planning ahead is also a key component.  Cooking - Human resources officer conducted group or individual video education with verbal and written material and guidebook.  Patient learns eating strategies to improve overall health, including an approach to cook more at home. Recommendations include thinking of animal protein as a side on your plate rather than center stage and focusing instead on lower calorie dense options like vegetables, fruits, whole grains, and plant-based proteins, such as beans. Making sauces in large quantities to freeze for later and leaving the skin on your vegetables are also recommended to maximize your experience.  Cooking - Healthy Salads and Dressing Clinical staff conducted group or individual video education with verbal and written material and guidebook.  Patient learns that vegetables, fruits, whole grains, and legumes are the foundations of the [place]. Recommendations include how to incorporate each of these in flavorful and healthy salads, and how to create homemade salad dressings. Proper handling of ingredients is also covered. Cooking - Soups and [employer] - Soups and Desserts Clinical staff conducted group or individual video education with verbal and written material and guidebook.  Patient learns that Pritikin soups and desserts make for easy, nutritious, and delicious snacks and meal components that are low in sodium, fat, sugar, and calorie density, while high in vitamins, minerals, and filling fiber. Recommendations include simple and healthy ideas for soups and desserts.   Overview     The Pritikin Solution Program Overview Clinical staff conducted group or individual video education with  verbal and written material and guidebook.  Patient learns that the results of the [place] Program have been documented in more than 100 articles published in peer-reviewed journals, and the benefits include reducing risk factors for (and, in some cases, even reversing) high cholesterol, high blood pressure, type 2 diabetes, obesity, and more! An overview of the three key pillars of the Pritikin Program will be covered: eating well, doing regular exercise, and having a healthy mind-set.  WORKSHOPS  Exercise: Exercise Basics: Building Your Action Plan Clinical staff led group instruction and group discussion with PowerPoint presentation and patient guidebook. To enhance the learning environment the use of posters, models and videos may be added. At  the conclusion of this workshop, patients will comprehend the difference between physical activity and exercise, as well as the benefits of incorporating both, into their routine. Patients will understand the FITT (Frequency, Intensity, Time, and Type) principle and how to use it to build an exercise action plan. In addition, safety concerns and other considerations for exercise and cardiac rehab will be addressed by the presenter. The purpose of this lesson is to promote a comprehensive and effective weekly exercise routine in order to improve patients' overall level of fitness.   Managing Heart Disease: Your Path to a Healthier Heart Clinical staff led group instruction and group discussion with PowerPoint presentation and patient guidebook. To enhance the learning environment the use of posters, models and videos may be added.At the conclusion of this workshop, patients will understand the anatomy and physiology of the heart. Additionally, they will understand how Pritikin's three pillars impact the risk factors, the progression, and the management of heart disease.  The purpose of this lesson is to provide a high-level overview of the heart, heart  disease, and how the Pritikin lifestyle positively impacts risk factors.  Exercise Biomechanics Clinical staff led group instruction and group discussion with PowerPoint presentation and patient guidebook. To enhance the learning environment the use of posters, models and videos may be added. Patients will learn how the structural parts of their bodies function and how these functions impact their daily activities, movement, and exercise. Patients will learn how to promote a neutral spine, learn how to manage pain, and identify ways to improve their physical movement in order to promote healthy living. The purpose of this lesson is to expose patients to common physical limitations that impact physical activity. Participants will learn practical ways to adapt and manage aches and pains, and to minimize their effect on regular exercise. Patients will learn how to maintain good posture while sitting, walking, and lifting.  Balance Training and Fall Prevention  Clinical staff led group instruction and group discussion with PowerPoint presentation and patient guidebook. To enhance the learning environment the use of posters, models and videos may be added. At the conclusion of this workshop, patients will understand the importance of their sensorimotor skills (vision, proprioception, and the vestibular system) in maintaining their ability to balance as they age. Patients will apply a variety of balancing exercises that are appropriate for their current level of function. Patients will understand the common causes for poor balance, possible solutions to these problems, and ways to modify their physical environment in order to minimize their fall risk. The purpose of this lesson is to teach patients about the importance of maintaining balance as they age and ways to minimize their risk of falling.  WORKSHOPS   Nutrition:  Fueling a Scientist, research (physical sciences) led group instruction and group  discussion with PowerPoint presentation and patient guidebook. To enhance the learning environment the use of posters, models and videos may be added. Patients will review the foundational principles of the [place] and understand what constitutes a serving size in each of the food groups. Patients will also learn Pritikin-friendly foods that are better choices when away from home and review make-ahead meal and snack options. Calorie density will be reviewed and applied to three nutrition priorities: weight maintenance, weight loss, and weight gain. The purpose of this lesson is to reinforce (in a group setting) the key concepts around what patients are recommended to eat and how to apply these guidelines when away from home by planning and selecting  Pritikin-friendly options. Patients will understand how calorie density may be adjusted for different weight management goals.  Mindful Eating  Clinical staff led group instruction and group discussion with PowerPoint presentation and patient guidebook. To enhance the learning environment the use of posters, models and videos may be added. Patients will briefly review the concepts of the [place] and the importance of low-calorie dense foods. The concept of mindful eating will be introduced as well as the importance of paying attention to internal hunger signals. Triggers for non-hunger eating and techniques for dealing with triggers will be explored. The purpose of this lesson is to provide patients with the opportunity to review the basic principles of the [place], discuss the value of eating mindfully and how to measure internal cues of hunger and fullness using the Hunger Scale. Patients will also discuss reasons for non-hunger eating and learn strategies to use for controlling emotional eating.  Targeting Your Nutrition Priorities Clinical staff led group instruction and group discussion with PowerPoint presentation and  patient guidebook. To enhance the learning environment the use of posters, models and videos may be added. Patients will learn how to determine their genetic susceptibility to disease by reviewing their family history. Patients will gain insight into the importance of diet as part of an overall healthy lifestyle in mitigating the impact of genetics and other environmental insults. The purpose of this lesson is to provide patients with the opportunity to assess their personal nutrition priorities by looking at their family history, their own health history and current risk factors. Patients will also be able to discuss ways of prioritizing and modifying the [place] for their highest risk areas  Menu  Clinical staff led group instruction and group discussion with PowerPoint presentation and patient guidebook. To enhance the learning environment the use of posters, models and videos may be added. Using menus brought in from [employer], or printed from [employer], patients will apply the [place] dining out guidelines that were presented in the [employer] video. Patients will also be able to practice these guidelines in a variety of provided scenarios. The purpose of this lesson is to provide patients with the opportunity to practice hands-on learning of the [place] with actual menus and practice scenarios.  Label Reading Clinical staff led group instruction and group discussion with PowerPoint presentation and patient guidebook. To enhance the learning environment the use of posters, models and videos may be added. Patients will review and discuss the Pritikin label reading guidelines presented in [employer]ritikin's Label Reading Educational series video. Using fool labels brought in from local grocery stores and markets, patients will apply the label reading guidelines and determine if the packaged food meet the Pritikin guidelines. The purpose of this  lesson is to provide patients with the opportunity to review, discuss, and practice hands-on learning of the Pritikin Label Reading guidelines with actual packaged food labels. Nad[facility] Workshops are designed to teach patients ways to prepare quick, simple, and affordable recipes at home. The importance of nutrition's role in chronic disease risk reduction is reflected in its emphasis in the overall Pritikin program. By learning how to prepare essential core Pritikin Eating Plan recipes, patients will increase control over what they eat; be able to customize the flavor of foods without the use of added salt, sugar, or fat; and improve the quality of the food they consume. By learning a set of core recipes which are easily  assembled, quickly prepared, and affordable, patients are more likely to prepare more healthy foods at home. These workshops focus on convenient breakfasts, simple entres, side dishes, and desserts which can be prepared with minimal effort and are consistent with nutrition recommendations for cardiovascular risk reduction. Cooking [employer] are taught by a Engineer, materials (RD) who has been trained by the [employer]. The chef or RD has a clear understanding of the importance of minimizing - if not completely eliminating - added fat, sugar, and sodium in recipes. Throughout the series of [place] Workshop sessions, patients will learn about healthy ingredients and efficient methods of cooking to build confidence in their capability to prepare    Cooking School weekly topics:  Adding Flavor- Sodium-Free  Fast and Healthy Breakfasts  Powerhouse Plant-Based Proteins  Satisfying Salads and Dressings  Simple Sides and Sauces  International Cuisine-Spotlight on the [place] Zones  Delicious Desserts  Savory Soups  Efficiency Cooking - Meals in a Snap  Tasty Appetizers and Snacks  Comforting Weekend Breakfasts  One-Pot  Wonders   Fast Evening Meals  Easy [place] (Psychosocial): New Thoughts, New Behaviors Clinical staff led group instruction and group discussion with PowerPoint presentation and patient guidebook. To enhance the learning environment the use of posters, models and videos may be added. Patients will learn and practice techniques for developing effective health and lifestyle goals. Patients will be able to effectively apply the goal setting process learned to develop at least one new personal goal.  The purpose of this lesson is to expose patients to a new skill set of behavior modification techniques such as techniques setting SMART goals, overcoming barriers, and achieving new thoughts and new behaviors.  Managing Moods and Relationships Clinical staff led group instruction and group discussion with PowerPoint presentation and patient guidebook. To enhance the learning environment the use of posters, models and videos may be added. Patients will learn how emotional and chronic stress factors can impact their health and relationships. They will learn healthy ways to manage their moods and utilize positive coping mechanisms. In addition, ICR patients will learn ways to improve communication skills. The purpose of this lesson is to expose patients to ways of understanding how one's mood and health are intimately connected. Developing a healthy outlook can help build positive relationships and connections with others. Patients will understand the importance of utilizing effective communication skills that include actively listening and being heard. They will learn and understand the importance of the "4 Cs" and especially Connections in fostering of a Healthy Mind-Set.  Healthy Sleep for a Healthy Heart Clinical staff led group instruction and group discussion with PowerPoint presentation and patient guidebook. To enhance the learning  environment the use of posters, models and videos may be added. At the conclusion of this workshop, patients will be able to demonstrate knowledge of the importance of sleep to overall health, well-being, and quality of life. They will understand the symptoms of, and treatments for, common sleep disorders. Patients will also be able to identify daytime and nighttime behaviors which impact sleep, and they will be able to apply these tools to help manage sleep-related challenges. The purpose of this lesson is to provide patients with a general overview of sleep and outline the importance of quality sleep. Patients will learn about a few of the most common sleep disorders. Patients will also be introduced to the concept of "sleep hygiene," and discover ways  to self-manage certain sleeping problems through simple daily behavior changes. Finally, the workshop will motivate patients by clarifying the links between quality sleep and their goals of heart-healthy living.   Recognizing and Reducing Stress Clinical staff led group instruction and group discussion with PowerPoint presentation and patient guidebook. To enhance the learning environment the use of posters, models and videos may be added. At the conclusion of this workshop, patients will be able to understand the types of stress reactions, differentiate between acute and chronic stress, and recognize the impact that chronic stress has on their health. They will also be able to apply different coping mechanisms, such as reframing negative self-talk. Patients will have the opportunity to practice a variety of stress management techniques, such as deep abdominal breathing, progressive muscle relaxation, and/or guided imagery.  The purpose of this lesson is to educate patients on the role of stress in their lives and to provide healthy techniques for coping with it.  Learning Barriers/Preferences:  Learning Barriers/Preferences - 10/16/21 1205       Learning  Barriers/Preferences   Learning Barriers Exercise Concerns   Ollen Gross sometimes feel lightheaded when he chnages positons   Learning Preferences Skilled Demonstration             Education Topics:  Knowledge Questionnaire Score:  Knowledge Questionnaire Score - 10/16/21 1102       Knowledge Questionnaire Score   Pre Score 19/24             Core Components/Risk Factors/Patient Goals at Admission:  Personal Goals and Risk Factors at Admission - 10/16/21 1041       Core Components/Risk Factors/Patient Goals on Admission    Weight Management Yes;Obesity;Weight Loss    Intervention Weight Management/Obesity: Establish reasonable short term and long term weight goals.;Obesity: Provide education and appropriate resources to help participant work on and attain dietary goals.    Admit Weight 206 lb 9.6 oz (93.7 kg)    Goal Weight: Short Term 196 lb 3.4 oz (89 kg)    Goal Weight: Long Term 191 lb (86.6 kg)    Expected Outcomes Short Term: Continue to assess and modify interventions until short term weight is achieved;Long Term: Adherence to nutrition and physical activity/exercise program aimed toward attainment of established weight goal;Weight Loss: Understanding of general recommendations for a balanced deficit meal plan, which promotes 1-2 lb weight loss per week and includes a negative energy balance of [phone] kcal/d    Hypertension Yes    Intervention Provide education on lifestyle modifcations including regular physical activity/exercise, weight management, moderate sodium restriction and increased consumption of fresh fruit, vegetables, and low fat dairy, alcohol moderation, and smoking cessation.;Monitor prescription use compliance.    Expected Outcomes Short Term: Continued assessment and intervention until BP is < 140/90m HG in hypertensive participants. < 130/875mHG in hypertensive participants with diabetes, heart failure or chronic kidney disease.;Long Term: Maintenance of  blood pressure at goal levels.    Lipids Yes    Intervention Provide education and support for participant on nutrition & aerobic/resistive exercise along with prescribed medications to achieve LDL '70mg'$ , HDL >'40mg'$ .    Expected Outcomes Short Term: Participant states understanding of desired cholesterol values and is compliant with medications prescribed. Participant is following exercise prescription and nutrition guidelines.;Long Term: Cholesterol controlled with medications as prescribed, with individualized exercise RX and with personalized nutrition plan. Value goals: LDL < '70mg'$ , HDL > 40 mg.    Stress Yes    Intervention Offer individual and/or small group education  and counseling on adjustment to heart disease, stress management and health-related lifestyle change. Teach and support self-help strategies.;Refer participants experiencing significant psychosocial distress to appropriate mental health specialists for further evaluation and treatment. When possible, include family members and significant others in education/counseling sessions.    Expected Outcomes Short Term: Participant demonstrates changes in health-related behavior, relaxation and other stress management skills, ability to obtain effective social support, and compliance with psychotropic medications if prescribed.;Long Term: Emotional wellbeing is indicated by absence of clinically significant psychosocial distress or social isolation.             Core Components/Risk Factors/Patient Goals Review:   Goals and Risk Factor Review     Row Name 10/24/21 1159 10/30/21 1416 11/20/21 1637         Core Components/Risk Factors/Patient Goals Review   Personal Goals Review Weight Management/Obesity;Lipids;Stress;Hypertension Weight Management/Obesity;Lipids;Stress;Hypertension Weight Management/Obesity;Lipids;Stress;Hypertension     Review Dariusz started intensvie cardiac rehab on 10/24/21 and did well with exercise. Vital signs  were stable. Noted that metoprolol was discontinued Marquin started intensvie cardiac rehab on 10/24/21 and is off to a good start to exercise. Vital signs have been stable. Heart rate is improved now that metoprolol has been discontinued . Damare doing very  well with intensvie cardiac rehab Vital signs have been stable. Ronny has lost 2.3 kg since starting cardiac rehab.     Expected Outcomes Dyron will continue to participate in intensive cardiac rehab for exercise, nutrition and lifestyle modifications Jarrod will continue to participate in intensive cardiac rehab for exercise, nutrition and lifestyle modifications Emric will continue to participate in intensive cardiac rehab for exercise, nutrition and lifestyle modifications              Core Components/Risk Factors/Patient Goals at Discharge (Final Review):   Goals and Risk Factor Review - 11/20/21 1637       Core Components/Risk Factors/Patient Goals Review   Personal Goals Review Weight Management/Obesity;Lipids;Stress;Hypertension    Review Jousha doing very  well with intensvie cardiac rehab Vital signs have been stable. Herby has lost 2.3 kg since starting cardiac rehab.    Expected Outcomes Tredarius will continue to participate in intensive cardiac rehab for exercise, nutrition and lifestyle modifications             ITP Comments:  ITP Comments     Row Name 10/16/21 0857 10/24/21 1123 10/30/21 1411 11/20/21 1635     ITP Comments Medical Director- Dr. Fransico Him, MD, Introduction to [facility]in Education Program/ Intensive Cardiac rehab. Initial Orientation Packet Reviewed with the patient 30 Day ITP Review. Burwell started intensive  cardiac rehab on 10/24/21 and did well with exercise 30 Day ITP Review. Tibor is off to a great start to exercise at intensive cardiac rehab. 30 Day ITP Review. Meryl has good attendance and participation in phase 2 cardiac rehab.             Comments: See ITP comments.Harrell Gave RN BSN

## 2021-11-21 ENCOUNTER — Encounter (HOSPITAL_COMMUNITY)
Admission: RE | Admit: 2021-11-21 | Discharge: 2021-11-21 | Disposition: A | Discharge status: Home / Self Care | Payer: No Typology Code available for payment source | Source: Ambulatory Visit | Attending: Cardiology | Admitting: Cardiology

## 2021-11-21 DIAGNOSIS — Z951 Presence of aortocoronary bypass graft: Secondary | ICD-10-CM | POA: Diagnosis not present

## 2021-11-21 NOTE — Progress Notes (Signed)
Patient reported feeling a little light headed this morning. Blood pressure 96/60. Nathan Barrett said that he ate grapes and 12 ounces of water. Patient was given water recheck sitting BP 98/72. Standing BP 108/72. Symptoms resolved. Nathan Barrett was able to complete exercise without further complaints. Nathan Barrett has lost 3.6 kg since starting cardiac rehab. The [facility] cardiology clinic called and notified about today's BP's. Spoke with [facility]. Med reviewed . Nathan Barrett is taking as prescribed.Will fax exercise flow sheets to [facility] at the [facility] for for review.Barnet Pall, RN,BSN 11/21/2021 2:17 PM

## 2021-11-23 ENCOUNTER — Encounter (HOSPITAL_COMMUNITY)
Admission: RE | Admit: 2021-11-23 | Discharge: 2021-11-23 | Disposition: A | Discharge status: Home / Self Care | Payer: No Typology Code available for payment source | Source: Ambulatory Visit | Attending: Cardiology | Admitting: Cardiology

## 2021-11-23 DIAGNOSIS — Z951 Presence of aortocoronary bypass graft: Secondary | ICD-10-CM

## 2021-11-23 NOTE — Progress Notes (Signed)
Nathan Barrett reported that the [place] discontinued his amlodipine. Will update medication list.Suraiya Dickerson [name] RN BSN

## 2021-11-26 ENCOUNTER — Encounter (HOSPITAL_COMMUNITY)
Admission: RE | Admit: 2021-11-26 | Discharge: 2021-11-26 | Disposition: A | Discharge status: Home / Self Care | Payer: No Typology Code available for payment source | Source: Ambulatory Visit | Attending: Cardiology | Admitting: Cardiology

## 2021-11-26 ENCOUNTER — Encounter (HOSPITAL_COMMUNITY): Payer: No Typology Code available for payment source

## 2021-11-26 DIAGNOSIS — Z951 Presence of aortocoronary bypass graft: Secondary | ICD-10-CM

## 2021-11-28 ENCOUNTER — Encounter (HOSPITAL_COMMUNITY)
Admission: RE | Admit: 2021-11-28 | Discharge: 2021-11-28 | Disposition: A | Discharge status: Home / Self Care | Payer: No Typology Code available for payment source | Source: Ambulatory Visit | Attending: Cardiology | Admitting: Cardiology

## 2021-11-28 DIAGNOSIS — Z951 Presence of aortocoronary bypass graft: Secondary | ICD-10-CM

## 2021-11-30 ENCOUNTER — Encounter (HOSPITAL_COMMUNITY)
Admission: RE | Admit: 2021-11-30 | Discharge: 2021-11-30 | Disposition: A | Discharge status: Home / Self Care | Payer: No Typology Code available for payment source | Source: Ambulatory Visit | Attending: Cardiology | Admitting: Cardiology

## 2021-11-30 DIAGNOSIS — Z951 Presence of aortocoronary bypass graft: Secondary | ICD-10-CM

## 2021-12-03 ENCOUNTER — Encounter (HOSPITAL_COMMUNITY)
Admission: RE | Admit: 2021-12-03 | Discharge: 2021-12-03 | Disposition: A | Discharge status: Home / Self Care | Payer: No Typology Code available for payment source | Source: Ambulatory Visit | Attending: Cardiology | Admitting: Cardiology

## 2021-12-03 DIAGNOSIS — Z951 Presence of aortocoronary bypass graft: Secondary | ICD-10-CM

## 2021-12-05 ENCOUNTER — Encounter (HOSPITAL_COMMUNITY)
Admission: RE | Admit: 2021-12-05 | Discharge: 2021-12-05 | Disposition: A | Discharge status: Home / Self Care | Payer: No Typology Code available for payment source | Source: Ambulatory Visit | Attending: Cardiology | Admitting: Cardiology

## 2021-12-05 DIAGNOSIS — Z951 Presence of aortocoronary bypass graft: Secondary | ICD-10-CM

## 2021-12-07 ENCOUNTER — Encounter (HOSPITAL_COMMUNITY): Payer: No Typology Code available for payment source

## 2021-12-10 ENCOUNTER — Encounter (HOSPITAL_COMMUNITY): Payer: No Typology Code available for payment source

## 2021-12-12 ENCOUNTER — Encounter (HOSPITAL_COMMUNITY)
Admission: RE | Admit: 2021-12-12 | Discharge: 2021-12-12 | Disposition: A | Discharge status: Home / Self Care | Payer: No Typology Code available for payment source | Source: Ambulatory Visit | Attending: Cardiology | Admitting: Cardiology

## 2021-12-12 DIAGNOSIS — Z951 Presence of aortocoronary bypass graft: Secondary | ICD-10-CM | POA: Diagnosis not present

## 2021-12-12 DIAGNOSIS — Z48812 Encounter for surgical aftercare following surgery on the circulatory system: Secondary | ICD-10-CM | POA: Diagnosis not present

## 2021-12-14 ENCOUNTER — Encounter (HOSPITAL_COMMUNITY)
Admission: RE | Admit: 2021-12-14 | Discharge: 2021-12-14 | Disposition: A | Discharge status: Home / Self Care | Payer: No Typology Code available for payment source | Source: Ambulatory Visit | Attending: Cardiology | Admitting: Cardiology

## 2021-12-14 DIAGNOSIS — Z951 Presence of aortocoronary bypass graft: Secondary | ICD-10-CM

## 2021-12-17 ENCOUNTER — Encounter (HOSPITAL_COMMUNITY)
Admission: RE | Admit: 2021-12-17 | Discharge: 2021-12-17 | Disposition: A | Discharge status: Home / Self Care | Payer: No Typology Code available for payment source | Source: Ambulatory Visit | Attending: Cardiology | Admitting: Cardiology

## 2021-12-17 DIAGNOSIS — Z951 Presence of aortocoronary bypass graft: Secondary | ICD-10-CM | POA: Diagnosis not present

## 2021-12-18 NOTE — Progress Notes (Signed)
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 Swallow whole., Disp: , Rfl:    atorvastatin (LIPITOR) 80 MG tablet, Take 1 tablet (80 mg total) by mouth every morning. (Patient taking differently: Take 40 mg by mouth every morning.), Disp: 90 tablet, Rfl: 1   Cholecalciferol (VITAMIN D3) 125 MCG (5000 UT) TABS, Take 5,000 Units by mouth daily., Disp: , Rfl:    clopidogrel (PLAVIX) 75 MG tablet, TAKE 1 TABLET BY MOUTH EVERY DAY (Patient taking differently: Take 75 mg by mouth daily.), Disp: 90 tablet, Rfl: 1   diclofenac Sodium (VOLTAREN) 1 % GEL, Apply 2 g topically daily as needed (pIN)., Disp: , Rfl:    losartan-hydrochlorothiazide (HYZAAR) 100-25 MG tablet, Take 1 tablet by mouth daily., Disp: , Rfl:    magnesium oxide (MAG-OX) 400 (240 Mg) MG tablet, Take 400 mg by mouth 3 (three) times a week., Disp: , Rfl:    Multiple Vitamin (MULTIVITAMIN WITH MINERALS) TABS tablet, Take 1 tablet by mouth daily. Centrum Silver men's 50 +, Disp: , Rfl:    omeprazole (PRILOSEC) 40 MG capsule, Take 40 mg by mouth daily., Disp: , Rfl:    traZODone (DESYREL) 50 MG tablet, Take 100 mg by mouth at bedtime., Disp: , Rfl:   Past Medical  History: Past Medical History:  Diagnosis Date   Aortic stenosis    Mild   Bradycardia    CKD (chronic kidney disease), stage II    Coronary artery disease    a. multivessel, treated medically.   H. pylori infection    Hypercholesterolemia    Hypertension     Tobacco Use: Social History   Tobacco Use  Smoking Status Never  Smokeless Tobacco Never    Labs: Review Flowsheet  More data exists      Latest Ref Rng & Units 03/15/2012 03/30/2015 07/12/2016 10/22/2018 07/31/2020  Labs for ITP Cardiac and Pulmonary Rehab  Cholestrol 100 - 199 mg/dL - 137  120  126  -  LDL (calc) 0 - 99 mg/dL - 84  66  73  -  HDL-C >39 mg/dL - 37  40  35  -  Trlycerides 0 - 149 mg/dL - 78  70  97  -  TCO2 22 - 32 mmol/L 27  - - - 24     Capillary Blood Glucose: Lab Results  Component Value Date   GLUCAP 116 (H) 12/20/2018   GLUCAP 124 (H) 03/15/2012     Exercise Target Goals: Exercise Program Goal: Individual exercise prescription set using results from initial 6 min walk test and THRR while considering  patient's activity barriers and safety.   Exercise Prescription Goal: Initial exercise prescription builds to 30-45  minutes a day of aerobic activity, 2-3 days per week.  Home exercise guidelines will be given to patient during program as part of exercise prescription that the participant will acknowledge.  Activity Barriers & Risk Stratification:  Activity Barriers & Cardiac Risk Stratification - 10/16/21 0938       Activity Barriers & Cardiac Risk Stratification   Activity Barriers Other (comment)    Comments Needs left shoulder surgery. Bilateral knee pain, gets gel injections every 6 months, needs knee replacement.    Cardiac Risk Stratification Moderate             6 Minute Walk:  6 Minute Walk     Row Name 10/16/21 0927         6 Minute Walk   Phase Initial     Distance 1675 feet     Walk Time 6 minutes     # of Rest Breaks 0     MPH 3.17     METS 3.63     RPE 11      Perceived Dyspnea  1     VO2 Peak 12.69     Symptoms Yes (comment)     Comments Mild SOB, RPD=1     Resting HR 52 bpm     Resting BP 122/84     Resting Oxygen Saturation  97 %     Exercise Oxygen Saturation  during 6 min walk 100 %     Max Ex. HR 73 bpm     Max Ex. BP 120/80     2 Minute Post BP 112/80              Oxygen Initial Assessment:   Oxygen Re-Evaluation:   Oxygen Discharge (Final Oxygen Re-Evaluation):   Initial Exercise Prescription:  Initial Exercise Prescription - 10/16/21 1000       Date of Initial Exercise RX and Referring Provider   Date 10/16/21    Referring Provider Filbert Berthold, MD   Sueanne Margarita, MD (covering)   Expected Discharge Date 12/14/21      Bike   Level 2.5    Minutes 15    METs 3.6      NuStep   Level 3    SPM 85    Minutes 15    METs 3      Prescription Details   Frequency (times per week) 3    Duration Progress to 30 minutes of continuous aerobic without signs/symptoms of physical distress      Intensity   THRR 40-80% of Max Heartrate 65-131    Ratings of Perceived Exertion 11-13    Perceived Dyspnea 0-4      Progression   Progression Continue to progress workloads to maintain intensity without signs/symptoms of physical distress.      Resistance Training   Training Prescription Yes    Weight 5 lbs    Reps 10-15             Perform Capillary Blood Glucose checks as needed.  Exercise Prescription Changes:   Exercise Prescription Changes     Row Name 10/24/21 1100 11/02/21 1000 11/07/21 1100 11/28/21 1600 12/17/21 1100     Response to Exercise   Blood Pressure (Admit) 128/72 110/70 110/72 120/64 104/66   Blood Pressure (Exercise) 142/82 142/74 142/80 142/76 138/70   Blood Pressure (Exit) 100/72 110/78 108/62 118/84 108/64   Heart Rate (Admit) 74 bpm 71 bpm 56 bpm 55 bpm 56 bpm   Heart Rate (Exercise) 121 bpm  121 bpm 132 bpm 140 bpm 129 bpm   Heart Rate (Exit) 69 bpm 74 bpm 67 bpm 65 bpm 64 bpm    Rating of Perceived Exertion (Exercise) '11 11 13 12 11   '$ Symptoms None None None None None   Comments Pt's first day in the CRP2 program Reviewed home exercise Rx Reviewed METs Reviewed METs and goals Reviewed METs and goals   Duration Continue with 30 min of aerobic exercise without signs/symptoms of physical distress. Continue with 30 min of aerobic exercise without signs/symptoms of physical distress. Continue with 30 min of aerobic exercise without signs/symptoms of physical distress. Continue with 30 min of aerobic exercise without signs/symptoms of physical distress. Continue with 30 min of aerobic exercise without signs/symptoms of physical distress.   Intensity THRR unchanged THRR unchanged THRR unchanged THRR unchanged THRR unchanged     Progression   Progression Continue to progress workloads to maintain intensity without signs/symptoms of physical distress. Continue to progress workloads to maintain intensity without signs/symptoms of physical distress. Continue to progress workloads to maintain intensity without signs/symptoms of physical distress. Continue to progress workloads to maintain intensity without signs/symptoms of physical distress. Continue to progress workloads to maintain intensity without signs/symptoms of physical distress.   Average METs 3.85 4.25 4.35 5.9 5.8     Resistance Training   Training Prescription No Yes No No Yes   Weight No weights on Wednesdays 5 lb No weights on Wednesday No weights on Wednesday 6 lbs   Reps -- 10-15 -- -- 10-15   Time -- 10 Minutes -- -- 10 Minutes     Interval Training   Interval Training -- No No No No     Bike   Level 2.5 2.5 -- 3.5 3.5   Minutes 15 15 -- 15 15   METs 4.3 4.4 -- 6.9 6.2     Recumbant Bike   Level -- -- 2.5 -- --   Minutes -- -- 15 -- --   METs -- -- 3.7 -- --     NuStep   Level '3 3 4 4 5   '$ Minutes '15 15 15 15 15   '$ METs 3.4 4.1 5 4.9 5.4     Home Exercise Plan   Plans to continue exercise at -- Home  (comment) Home (comment) Home (comment) Home (comment)   Frequency -- Add 3 additional days to program exercise sessions. Add 3 additional days to program exercise sessions. Add 3 additional days to program exercise sessions. Add 3 additional days to program exercise sessions.   Initial Home Exercises Provided -- 11/02/21 11/02/21 11/02/21 11/02/21            Exercise Comments:   Exercise Comments     Row Name 10/24/21 1149 11/02/21 1058 11/07/21 1156 11/28/21 1617 12/17/21 1127   Exercise Comments Pt's first day in the CRP2 program. Pt exercised with no complaints. Pt voices that he has started walking at home 2-3 x/week for 40 minutes. Therefore, reviewed home exercise Rx for safe exercise at home. Pt verbalized understanding of home exercise Rx and was provided a copy. Reviewed METs. Pt increased workload on Nustep today with no complaints. Reviewed METs and goals. Pt is making excellent progress with Peak METs of 6.9. Reviewed METs. Pt making good progress. Exercing at home 35-45 minutes on treadmill. Uses total gym. Just bought a bike for variety.            Exercise Goals and Review:   Exercise Goals  [place] Name 10/16/21 0913             Exercise Goals   Increase Physical Activity Yes       Intervention Provide advice, education, support and counseling about physical activity/exercise needs.;Develop an individualized exercise prescription for aerobic and resistive training based on initial evaluation findings, risk stratification, comorbidities and participant's personal goals.       Expected Outcomes Short Term: Attend rehab on a regular basis to increase amount of physical activity.;Long Term: Exercising regularly at least 3-5 days a week.;Long Term: Add in home exercise to make exercise part of routine and to increase amount of physical activity.       Increase Strength and Stamina Yes       Intervention Provide advice, education, support and counseling about physical  activity/exercise needs.;Develop an individualized exercise prescription for aerobic and resistive training based on initial evaluation findings, risk stratification, comorbidities and participant's personal goals.       Expected Outcomes Short Term: Increase workloads from initial exercise prescription for resistance, speed, and METs.;Short Term: Perform resistance training exercises routinely during rehab and add in resistance training at home;Long Term: Improve cardiorespiratory fitness, muscular endurance and strength as measured by increased METs and functional capacity (6MWT)       Able to understand and use rate of perceived exertion (RPE) scale Yes       Intervention Provide education and explanation on how to use RPE scale       Expected Outcomes Short Term: Able to use RPE daily in rehab to express subjective intensity level;Long Term:  Able to use RPE to guide intensity level when exercising independently       Knowledge and understanding of Target Heart Rate Range (THRR) Yes       Intervention Provide education and explanation of THRR including how the numbers were predicted and where they are located for reference       Expected Outcomes Short Term: Able to state/look up THRR;Long Term: Able to use THRR to govern intensity when exercising independently;Short Term: Able to use daily as guideline for intensity in rehab       Able to check pulse independently Yes       Intervention Provide education and demonstration on how to check pulse in carotid and radial arteries.;Review the importance of being able to check your own pulse for safety during independent exercise       Expected Outcomes Short Term: Able to explain why pulse checking is important during independent exercise;Long Term: Able to check pulse independently and accurately       Understanding of Exercise Prescription Yes       Intervention Provide education, explanation, and written materials on patient's individual exercise  prescription       Expected Outcomes Short Term: Able to explain program exercise prescription;Long Term: Able to explain home exercise prescription to exercise independently                Exercise Goals Re-Evaluation :  Exercise Goals Re-Evaluation     [place] Name 10/24/21 1146 11/28/21 1613           Exercise Goal Re-Evaluation   Exercise Goals Review Increase Physical Activity;Increase Strength and Stamina;Able to understand and use rate of perceived exertion (RPE) scale;Knowledge and understanding of Target Heart Rate Range (THRR);Understanding of Exercise Prescription Increase Physical Activity;Increase Strength and Stamina;Able to understand and use rate of perceived exertion (RPE) scale;Knowledge and understanding of Target Heart Rate Range (THRR);Understanding of Exercise  Prescription      Comments Pt's first day in the CRP2 program, Pt understands the exercise Rx, RPE scale, THRR. Reviewed METs and goals. Pt had goal of weight loss and has lost 4.4 kg (9.7 lbs). Pt voices he has improved his nutrition habits which was also a goal. Pt has peak METs of 6.9. He uses his treadmill at home on off days from CRP2 program for 40 minutes. Pt also uses his total gym and does stretching.      Expected Outcomes Will continue to monitor patient and progress exercise workloads as tolerated. Will continue to monitor patient and progress exercise workloads as tolerated.               Discharge Exercise Prescription (Final Exercise Prescription Changes):  Exercise Prescription Changes - 12/17/21 1100       Response to Exercise   Blood Pressure (Admit) 104/66    Blood Pressure (Exercise) 138/70    Blood Pressure (Exit) 108/64    Heart Rate (Admit) 56 bpm    Heart Rate (Exercise) 129 bpm    Heart Rate (Exit) 64 bpm    Rating of Perceived Exertion (Exercise) 11    Symptoms None    Comments Reviewed METs and goals    Duration Continue with 30 min of aerobic exercise without signs/symptoms  of physical distress.    Intensity THRR unchanged      Progression   Progression Continue to progress workloads to maintain intensity without signs/symptoms of physical distress.    Average METs 5.8      Resistance Training   Training Prescription Yes    Weight 6 lbs    Reps 10-15    Time 10 Minutes      Interval Training   Interval Training No      Bike   Level 3.5    Minutes 15    METs 6.2      NuStep   Level 5    Minutes 15    METs 5.4      Home Exercise Plan   Plans to continue exercise at Home (comment)    Frequency Add 3 additional days to program exercise sessions.    Initial Home Exercises Provided 11/02/21             Nutrition:  Target Goals: Understanding of nutrition guidelines, daily intake of sodium '1500mg'$ , cholesterol '200mg'$ , calories 30% from fat and 7% or less from saturated fats, daily to have 5 or more servings of fruits and vegetables.  Biometrics:  Pre Biometrics - 10/16/21 0857       Pre Biometrics   Waist Circumference 44 inches    Hip Circumference 41.75 inches    Waist to Hip Ratio 1.05 %    Triceps Skinfold 21 mm    % Body Fat 32.6 %    Grip Strength 44 kg    Flexibility 10.75 in    Single Leg Stand 17.12 seconds              Nutrition Therapy Plan and Nutrition Goals:  Nutrition Therapy & Goals - 12/14/21 1148       Nutrition Therapy   Diet Heart healthy diet    Drug/Food Interactions Statins/Certain Fruits      Personal Nutrition Goals   Nutrition Goal Patient to identify strategies for managing cardiovascular risk by attending the weekly [facility] education and nutrition classes    Personal Goal #2 Patient to limit to '1500mg'$  of sodium    Personal Goal #  3 Patient to identify food sources and limit daily intake of saturated fat, trans fat, and sodium    Comments Goals in action. Rosser remains extremely motivated to make lifestyle changes to aid with heart health and blood sugar control. He regularly attends the  [facility] education and nutrition series. He has made many dietary changes with close attention to refined carbohydrates, fiber intake, sodium, and fiber. He is down 13# since starting with our program.      Intervention Plan   Intervention Nutrition handout(s) given to patient.;Prescribe, educate and counsel regarding individualized specific dietary modifications aiming towards targeted core components such as weight, hypertension, lipid management, diabetes, heart failure and other comorbidities.    Expected Outcomes Short Term Goal: Understand basic principles of dietary content, such as calories, fat, sodium, cholesterol and nutrients.;Long Term Goal: Adherence to prescribed nutrition plan.             Nutrition Assessments:  Nutrition Assessments - 10/24/21 1055       Rate Your Plate Scores   Pre Score 53            MEDIFICTS Score Key: ?70 Need to make dietary changes  40-70 Heart Healthy Diet ? 40 Therapeutic Level Cholesterol Diet   Flowsheet Row INTENSIVE CARDIAC REHAB from 10/24/2021 in [place]  Picture Your Plate Total Score on Admission 53      Picture Your Plate Scores: <60 Unhealthy dietary pattern with much room for improvement. 41-50 Dietary pattern unlikely to meet recommendations for good health and room for improvement. 51-60 More healthful dietary pattern, with some room for improvement.  >60 Healthy dietary pattern, although there may be some specific behaviors that could be improved.    Nutrition Goals Re-Evaluation:  Nutrition Goals Re-Evaluation     Row Name 10/24/21 1050 11/19/21 0941 12/14/21 1148         Goals   Current Weight 209 lb 10.5 oz (95.1 kg) 201 lb 11.5 oz (91.5 kg) 193 lb 2 oz (87.6 kg)     Comment Lipid panel WNL, A1c 5.6 No new labs at this time, however, most recent labs show A1c improved to 5.6, HDL 38, LDL 98, RBC 4.12, hemoglobin 11.4, hematocrit 33.1 no new labs at this time.     Expected  Outcome Patient reports motivation to increase fiber through fruit and vegetable intake and decrease mindless snacking type behaviors. A1c has improved from 5.9 to 5.6. Goals in action. Ceylon remains extremely motivated to make lifestyle changes to aid with heart health and blood sugar control. He regularly attends the [facility] education and nutrition series. He has increased high fiber foods and reduced saturated fats. He is reading food labels for sugar and sodium. He reports improved satiety throughout the day since increasing fiber and reduced overall snacking on refined carbohydrates. He is down 4.8# since starting with our program. Goals in action. Jaquille remains extremely motivated to make lifestyle changes to aid with heart health and blood sugar control. He regularly attends the [facility] education and nutrition series. He has made many dietary changes with close attention to refined carbohydrates, fiber intake, sodium, and saturated fat. He is down 13# since starting with our program. No new labs at this time; however, I suspect improvement to cholesterol and A1c related to weight loss and reported dietary changes.              Nutrition Goals Re-Evaluation:  Nutrition Goals Re-Evaluation     [place] Name 10/24/21 1050  11/19/21 0941 12/14/21 1148         Goals   Current Weight 209 lb 10.5 oz (95.1 kg) 201 lb 11.5 oz (91.5 kg) 193 lb 2 oz (87.6 kg)     Comment Lipid panel WNL, A1c 5.6 No new labs at this time, however, most recent labs show A1c improved to 5.6, HDL 38, LDL 98, RBC 4.12, hemoglobin 11.4, hematocrit 33.1 no new labs at this time.     Expected Outcome Patient reports motivation to increase fiber through fruit and vegetable intake and decrease mindless snacking type behaviors. A1c has improved from 5.9 to 5.6. Goals in action. Delance remains extremely motivated to make lifestyle changes to aid with heart health and blood sugar control. He regularly attends the [facility] education and  nutrition series. He has increased high fiber foods and reduced saturated fats. He is reading food labels for sugar and sodium. He reports improved satiety throughout the day since increasing fiber and reduced overall snacking on refined carbohydrates. He is down 4.8# since starting with our program. Goals in action. Merril remains extremely motivated to make lifestyle changes to aid with heart health and blood sugar control. He regularly attends the [facility] education and nutrition series. He has made many dietary changes with close attention to refined carbohydrates, fiber intake, sodium, and saturated fat. He is down 13# since starting with our program. No new labs at this time; however, I suspect improvement to cholesterol and A1c related to weight loss and reported dietary changes.              Nutrition Goals Discharge (Final Nutrition Goals Re-Evaluation):  Nutrition Goals Re-Evaluation - 12/14/21 1148       Goals   Current Weight 193 lb 2 oz (87.6 kg)    Comment no new labs at this time.    Expected Outcome Goals in action. Matyas remains extremely motivated to make lifestyle changes to aid with heart health and blood sugar control. He regularly attends the [facility] education and nutrition series. He has made many dietary changes with close attention to refined carbohydrates, fiber intake, sodium, and saturated fat. He is down 13# since starting with our program. No new labs at this time; however, I suspect improvement to cholesterol and A1c related to weight loss and reported dietary changes.             Psychosocial: Target Goals: Acknowledge presence or absence of significant depression and/or stress, maximize coping skills, provide positive support system. Participant is able to verbalize types and ability to use techniques and skills needed for reducing stress and depression.  Initial Review & Psychosocial Screening:  Initial Psych Review & Screening - 10/16/21 1202        Initial Review   Current issues with Current Depression;History of Depression;Current Sleep Concerns;Current Stress Concerns    Source of Stress Concerns Chronic Illness;Poor Coping Skills    Comments Srihith admits he is depressed. he is reciving coun selling from the [place]? 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 Cadden receives therapy every other week which he finds helpful.      Barriers   Psychosocial barriers to participate in program The patient should benefit from training in stress management and relaxation.      Screening Interventions   Interventions Encouraged to exercise;Provide feedback about the scores to  participant;To provide support and resources with identified psychosocial needs    Expected Outcomes Long Term Goal: Stressors or current issues are controlled or eliminated.;Short Term goal: Identification and review with participant of any Quality of Life or Depression concerns found by scoring the questionnaire.;Long Term goal: The participant improves quality of Life and PHQ9 Scores as seen by post scores and/or verbalization of changes             Quality of Life Scores:  Quality of Life - 10/16/21 1102       Quality of Life   Select Quality of Life      Quality of Life Scores   Health/Function Pre 13.93 %    Socioeconomic Pre 19.58 %    Psych/Spiritual Pre 16.64 %    Family Pre 22.8 %    GLOBAL Pre 16.88 %            Scores of 19 and below usually indicate a poorer quality of life in these areas.  A difference of  2-3 points is a clinically meaningful difference.  A difference of 2-3 points in the total score of the Quality of Life Index has been associated with significant improvement in overall quality of life, self-image, physical symptoms, and general health in studies assessing change in quality of life.  PHQ-9: Review Flowsheet        10/16/2021 09/05/2014 05/18/2014  Depression screen PHQ 2/9  Decreased Interest 3 0 0  Down, Depressed, Hopeless 2 0 0  PHQ - 2 Score 5 0 0  Altered sleeping 3 - -  Tired, decreased energy 1 - -  Change in appetite 0 - -  Feeling bad or failure about yourself  2 - -  Trouble concentrating 2 - -  Moving slowly or fidgety/restless 0 - -  Suicidal thoughts 0 - -  PHQ-9 Score 13 - -  Difficult doing work/chores Very difficult - -   Interpretation of Total Score  Total Score Depression Severity:  1-4 = Minimal depression, 5-9 = Mild depression, 10-14 = Moderate depression, 15-19 = Moderately severe depression, 20-27 = Severe depression   Psychosocial Evaluation and Intervention:   Psychosocial Re-Evaluation:  Psychosocial Re-Evaluation     [place] Name 10/24/21 1124 11/20/21 1636 12/18/21 1552         Psychosocial Re-Evaluation   Current issues with Current Stress Concerns;Current Sleep Concerns;History of Depression;Current Depression Current Stress Concerns;Current Sleep Concerns;History of Depression;Current Depression Current Stress Concerns;Current Sleep Concerns;History of Depression;Current Depression     Comments Antonie is currently depressed. Derril is receving counselling biweekly as he aslo suffers from PTSD. Quality of life questionnaire reviewed. Sigfredo says his depression and PTSD is controlled on medication Lemon is currently depressed. Kron is receving counselling biweekly as he aslo suffers from PTSD. Alasdair says his depression and PTSD is controlled on medication. Kmarion has not voiced having increased depression since participating in cardiac rehab. Curtiss is currently depressed. Mckyle is receving counselling biweekly as he aslo suffers from PTSD. Aadit says his depression and PTSD is controlled on medication. Latif has not voiced having increased depression since participating in cardiac rehab. Hendricks will complete intensive cardiac rehab on 12/28/21     Expected Outcomes  Len will have decreased or controlled  depression upon completion of intensive cardiac rehab. Gaylan will have decreased or controlled  depression upon completion of intensive cardiac rehab. Trell will have decreased or controlled  depression upon completion of intensive cardiac rehab.     Interventions Encouraged to  attend Cardiac Rehabilitation for the exercise;Stress management education;Relaxation education Encouraged to attend Cardiac Rehabilitation for the exercise;Stress management education;Relaxation education Encouraged to attend Cardiac Rehabilitation for the exercise;Stress management education;Relaxation education     Continue Psychosocial Services  Follow up required by staff Follow up required by staff No Follow up required       Initial Review   Source of Stress Concerns Chronic Illness;Retirement/disability;Poor Coping Skills Chronic Illness;Retirement/disability;Poor Coping Skills Chronic Illness;Retirement/disability;Poor Coping Skills     Comments Will continue to monitor and offer support as needed Will continue to monitor and offer support as needed Will continue to monitor and offer support as needed              Psychosocial Discharge (Final Psychosocial Re-Evaluation):  Psychosocial Re-Evaluation - 12/18/21 1552       Psychosocial Re-Evaluation   Current issues with Current Stress Concerns;Current Sleep Concerns;History of Depression;Current Depression    Comments Chesney is currently depressed. Latravis is receving counselling biweekly as he aslo suffers from PTSD. Eryc says his depression and PTSD is controlled on medication. Hyman has not voiced having increased depression since participating in cardiac rehab. Arnulfo will complete intensive cardiac rehab on 12/28/21    Expected Outcomes Erion will have decreased or controlled  depression upon completion of intensive cardiac rehab.    Interventions Encouraged to attend Cardiac Rehabilitation for the exercise;Stress  management education;Relaxation education    Continue Psychosocial Services  No Follow up required      Initial Review   Source of Stress Concerns Chronic Illness;Retirement/disability;Poor Coping Skills    Comments Will continue to monitor and offer support as needed             Vocational Rehabilitation: Provide vocational rehab assistance to qualifying candidates.   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            Education: Education Goals: Education classes will be provided on a weekly basis, covering required topics. Participant will state understanding/return demonstration of topics presented.    Education     Row Name 10/24/21 0900     Education   Cardiac Education Topics [employer]   Educator Dietitian   Weekly Topic [employer] Desserts   Instruction Review Code 1- Verbalizes Understanding   Class Start Time [phone]   Class Stop Time [phone]   Class Time Calculation (min) 31 min    [place] Name 10/26/21 0900     Education   Cardiac Education Topics Pritikin   Select Workshops     Workshops   Educator Exercise Physiologist   Select Psychosocial   Psychosocial Workshop Managing Moods and Relationships   Instruction Review Code 1- Verbalizes Understanding   Class Start Time 0810   Class Stop Time 0851   Class Time Calculation (min) 41 min    [place] Name 10/29/21 0900     Education   Cardiac Education Topics [place]   Select Workshops     Workshops   Educator Exercise Physiologist   Select Exercise   Exercise Workshop Exercise Basics: Building Your Action Plan   Instruction Review Code 1- Verbalizes Understanding   Class Start Time [phone]   Class Stop Time 0855   Class Time  Calculation (min) 43 min    [place] Name 10/31/21 0900     Education   Cardiac Education Topics [place]   Educator Dietitian   Weekly Topic Efficiency Cooking - Meals in a Snap   Instruction Review Code 1- Verbalizes Understanding   Class Start Time 0809   Class Stop Time 2355   Class Time Calculation (min) 35 min    [place] Name 11/02/21 0900     Education   Cardiac Education Topics Pritikin   Select Core Videos     Core Videos   Educator Exercise Physiologist   Select Exercise Education   Exercise Education Move It!   Instruction Review Code 1- Verbalizes Understanding   Class Start Time 0815   Class Stop Time 0853   Class Time Calculation (min) 38 min    [place] Name 11/05/21 0900     Education   Cardiac Education Topics Pritikin   Select Core Videos     Core Videos   Educator Dietitian   Select Nutrition   Nutrition Nutrition Action Plan   Instruction Review Code 1- Verbalizes Understanding   Class Start Time 0815   Class Stop Time [phone]   Class Time Calculation (min) 37 min    [place] Name 11/07/21 0900     Education   Cardiac Education Topics [employer]   Educator Dietitian   Weekly Topic Simple Sides and Sauces   Instruction Review Code 1- Verbalizes Understanding   Class Start Time 0809   Class Stop Time 0840   Class Time Calculation (min) 31 min    [place] Name 11/09/21 0900     Education   Cardiac Education Topics Pritikin   Select Core Videos     Core Videos   Educator Nurse   Select General Education   General Education Hypertension and Heart Disease   Instruction Review Code 1- Verbalizes Understanding   Class Start Time 0815   Class Stop Time 0850   Class Time Calculation (min) 35 min    [place] Name 11/12/21 0900     Education   Cardiac Education Topics Pritikin   Select Core Videos     Core Videos   Educator Dietitian   Select Nutrition   Nutrition Dining Out  - Part 1   Instruction Review Code 1- Verbalizes Understanding   Class Start Time 0815   Class Stop Time 0850   Class Time Calculation (min) 35 min    [place] Name 11/14/21 0900     Education   Cardiac Education Topics [employer]   Educator Dietitian   Weekly Topic One-Pot Wonders   Instruction Review Code 1- Verbalizes Understanding   Class Start Time [phone]   Class Stop Time 0845   Class Time Calculation (min) 35 min    [place] Name 11/19/21 0900     Education   Cardiac Education Topics Pritikin   Select Core Videos     Core Videos   Educator Exercise Physiologist   Select Exercise Education   Exercise Education Biomechanial Limitations   Instruction Review Code 1- Verbalizes Understanding   Class Start Time 0815   Class Stop Time 0900   Class Time Calculation (min) 45 min    [place] Name 11/21/21 1000     Education   Cardiac Education Topics [place]  Sport and exercise psychologist   Weekly Topic Fast Evening Meals   Instruction Review Code 1- Verbalizes Understanding   Class Start Time [phone]   Class Stop Time 1308   Class Time Calculation (min) 35 min    [place] Name 11/23/21 0900     Education   Cardiac Education Topics Pritikin   Select Core Videos     Core Videos   Educator Exercise Physiologist   Select Psychosocial   Psychosocial How Our Thoughts Can Heal Our Hearts   Instruction Review Code 1- Verbalizes Understanding   Class Start Time 0815   Class Stop Time [phone]   Class Time Calculation (min) 39 min    [place] Name 11/26/21 0800     Education   Cardiac Education Topics Pritikin   IT sales professional Nutrition   Nutrition Workshop Fueling a Designer, multimedia   Instruction Review Code 1- Verbalizes Understanding   Class Start Time [phone]   Class Stop Time 0854   Class Time Calculation (min) 42 min    [place] Name 11/28/21 [place]   Educator Dietitian   Weekly Topic Adding Flavor - Sodium-Free   Instruction Review Code 1- Verbalizes Understanding   Class Start Time 0809   Class Stop Time 0840   Class Time Calculation (min) 31 min    [place] Name 11/30/21 1200     Education   Cardiac Education Topics Pritikin   Tax inspector General Education   General Education Heart Disease Risk Reduction   Instruction Review Code 1- Verbalizes Understanding   Class Start Time 0808   Class Stop Time 0850   Class Time Calculation (min) 42 min    [place] Name 12/03/21 0900     Education   Cardiac Education Topics Pritikin   [facility] Workshops     Workshops   Educator Exercise Physiologist   Select Psychosocial   Psychosocial Workshop Recognizing and Reducing Stress   Instruction Review Code 1- Verbalizes Understanding   Class Start Time 0810   Class Stop Time 0854   Class Time Calculation (min) 44 min    [place] Name 12/05/21 0900     Education   Cardiac Education Topics [employer]   Educator Dietitian   Weekly Topic Fast and Healthy Breakfasts   Instruction Review Code 1- Verbalizes Understanding   Class Start Time [phone]   Class Stop Time 0858   Class Time Calculation (min) 48 min    [place] Name 12/12/21 1200     Education   Cardiac Education Topics [employer]   Educator Dietitian   Weekly Topic Personalizing Your Pritikin Plate   Instruction Review Code 1- Verbalizes Understanding   Class Start Time 0810   Class Stop Time 0850   Class Time Calculation (min) 40 min    [place] Name 12/14/21 0900     Education   Cardiac Education Topics Pritikin   Academic librarian Psychosocial   Psychosocial Healthy Minds, Bodies, Hearts   Instruction Review  Code 1- Verbalizes Understanding   Class Start  Time 0810   Class Stop Time 0845   Class Time Calculation (min) 35 min    [place] Name 12/17/21 0900     Education   Cardiac Education Topics Pritikin   [facility] Workshops     Workshops   Educator Dietitian   Select Nutrition   Nutrition Workshop Label Reading   Instruction Review Code 1- Verbalizes Understanding   Class Start Time 0813   Class Stop Time 0904   Class Time Calculation (min) 51 min            Core Videos: Exercise    Move It!  Clinical staff conducted group or individual video education with verbal and written material and guidebook.  Patient learns the recommended Pritikin exercise program. Exercise with the goal of living a long, healthy life. Some of the health benefits of exercise include controlled diabetes, healthier blood pressure levels, improved cholesterol levels, improved heart and lung capacity, improved sleep, and better body composition. Everyone should speak with their doctor before starting or changing an exercise routine.  Biomechanical Limitations Clinical staff conducted group or individual video education with verbal and written material and guidebook.  Patient learns how biomechanical limitations can impact exercise and how we can mitigate and possibly overcome limitations to have an impactful and balanced exercise routine.  Body Composition Clinical staff conducted group or individual video education with verbal and written material and guidebook.  Patient learns that body composition (ratio of muscle mass to fat mass) is a key component to assessing overall fitness, rather than body weight alone. Increased fat mass, especially visceral belly fat, can put [place] at increased risk for metabolic syndrome, type 2 diabetes, heart disease, and even death. It is recommended to combine diet and exercise (cardiovascular and resistance training) to improve your body composition. Seek guidance from your physician  and exercise physiologist before implementing an exercise routine.  Exercise Action Plan Clinical staff conducted group or individual video education with verbal and written material and guidebook.  Patient learns the recommended strategies to achieve and enjoy long-term exercise adherence, including variety, self-motivation, self-efficacy, and positive decision making. Benefits of exercise include fitness, good health, weight management, more energy, better sleep, less stress, and overall well-being.  Medical   Heart Disease Risk Reduction Clinical staff conducted group or individual video education with verbal and written material and guidebook.  Patient learns our heart is our most vital organ as it circulates oxygen, nutrients, white blood cells, and hormones throughout the entire body, and carries waste away. Data supports a plant-based eating plan like the Pritikin Program for its effectiveness in slowing progression of and reversing heart disease. The video provides a number of recommendations to address heart disease.   Metabolic Syndrome and Belly Fat  Clinical staff conducted group or individual video education with verbal and written material and guidebook.  Patient learns what metabolic syndrome is, how it leads to heart disease, and how one can reverse it and keep it from coming back. You have metabolic syndrome if you have 3 of the following 5 criteria: abdominal obesity, high blood pressure, high triglycerides, low HDL cholesterol, and high blood sugar.  Hypertension and Heart Disease Clinical staff conducted group or individual video education with verbal and written material and guidebook.  Patient learns that high blood pressure, or hypertension, is very common in the [place]. Hypertension is largely due to excessive salt intake, but other important risk factors include being overweight, physical inactivity, drinking too much alcohol, smoking, and not eating  enough potassium  from fruits and vegetables. High blood pressure is a leading risk factor for heart attack, stroke, congestive heart failure, dementia, kidney failure, and premature death. Long-term effects of excessive salt intake include stiffening of the arteries and thickening of heart muscle and organ damage. Recommendations include ways to reduce hypertension and the risk of heart disease.  Diseases of Our Time - Focusing on Diabetes Clinical staff conducted group or individual video education with verbal and written material and guidebook.  Patient learns why the best way to stop diseases of our time is prevention, through food and other lifestyle changes. Medicine (such as prescription pills and surgeries) is often only a Band-Aid on the problem, not a long-term solution. Most common diseases of our time include obesity, type 2 diabetes, hypertension, heart disease, and cancer. The Pritikin Program is recommended and has been proven to help reduce, reverse, and/or prevent the damaging effects of metabolic syndrome.  Nutrition   Overview of the Pritikin Eating Plan  Clinical staff conducted group or individual video education with verbal and written material and guidebook.  Patient learns about the [place] for disease risk reduction. The [place] emphasizes a wide variety of unrefined, minimally-processed carbohydrates, like fruits, vegetables, whole grains, and legumes. Go, Caution, and Stop food choices are explained. Plant-based and lean animal proteins are emphasized. Rationale provided for low sodium intake for blood pressure control, low added sugars for blood sugar stabilization, and low added fats and oils for coronary artery disease risk reduction and weight management.  Calorie Density  Clinical staff conducted group or individual video education with verbal and written material and guidebook.  Patient learns about calorie density and how it impacts the Pritikin Eating Plan.  Knowing the characteristics of the food you choose will help you decide whether those foods will lead to weight gain or weight loss, and whether you want to consume more or less of them. Weight loss is usually a side effect of the Pritikin Eating Plan because of its focus on low calorie-dense foods.  Label Reading  Clinical staff conducted group or individual video education with verbal and written material and guidebook.  Patient learns about the Pritikin recommended label reading guidelines and corresponding recommendations regarding calorie density, added sugars, sodium content, and whole grains.  Dining Out - Part 1  Clinical staff conducted group or individual video education with verbal and written material and guidebook.  Patient learns that restaurant meals can be sabotaging because they can be so high in calories, fat, sodium, and/or sugar. Patient learns recommended strategies on how to positively address this and avoid unhealthy pitfalls.  Facts on Fats  Clinical staff conducted group or individual video education with verbal and written material and guidebook.  Patient learns that lifestyle modifications can be just as effective, if not more so, as many medications for lowering your risk of heart disease. A Pritikin lifestyle can help to reduce your risk of inflammation and atherosclerosis (cholesterol build-up, or plaque, in the artery walls). Lifestyle interventions such as dietary choices and physical activity address the cause of atherosclerosis. A review of the types of fats and their impact on blood cholesterol levels, along with dietary recommendations to reduce fat intake is also included.  Nutrition Action Plan  Clinical staff conducted group or individual video education with verbal and written material and guidebook.  Patient learns how to incorporate Pritikin recommendations into their lifestyle. Recommendations include planning and keeping personal health goals in mind as an  important part of their success.  Healthy Mind-Set    Healthy Minds, Bodies, Hearts  Clinical staff conducted group or individual video education with verbal and written material and guidebook.  Patient learns how to identify when they are stressed. Video will discuss the impact of that stress, as well as the many benefits of stress management. Patient will also be introduced to stress management techniques. The way we think, act, and feel has an impact on our hearts.  How Our Thoughts Can Heal Our Hearts  Clinical staff conducted group or individual video education with verbal and written material and guidebook.  Patient learns that negative thoughts can cause depression and anxiety. This can result in negative lifestyle behavior and serious health problems. Cognitive behavioral therapy is an effective method to help control our thoughts in order to change and improve our emotional outlook.  Additional Videos:  Exercise    Improving Performance  Clinical staff conducted group or individual video education with verbal and written material and guidebook.  Patient learns to use a non-linear approach by alternating intensity levels and lengths of time spent exercising to help burn more calories and lose more body fat. Cardiovascular exercise helps improve heart health, metabolism, hormonal balance, blood sugar control, and recovery from fatigue. Resistance training improves strength, endurance, balance, coordination, reaction time, metabolism, and muscle mass. Flexibility exercise improves circulation, posture, and balance. Seek guidance from your physician and exercise physiologist before implementing an exercise routine and learn your capabilities and proper form for all exercise.  Introduction to Yoga  Clinical staff conducted group or individual video education with verbal and written material and guidebook.  Patient learns about yoga, a discipline of the coming together of mind, breath, and  body. The benefits of yoga include improved flexibility, improved range of motion, better posture and core strength, increased lung function, weight loss, and positive self-image. Yoga's heart health benefits include lowered blood pressure, healthier heart rate, decreased cholesterol and triglyceride levels, improved immune function, and reduced stress. Seek guidance from your physician and exercise physiologist before implementing an exercise routine and learn your capabilities and proper form for all exercise.  Medical   Aging: Enhancing Your Quality of Life  Clinical staff conducted group or individual video education with verbal and written material and guidebook.  Patient learns key strategies and recommendations to stay in good physical health and enhance quality of life, such as prevention strategies, having an advocate, securing a [place], and keeping a list of medications and system for tracking them. It also discusses how to avoid risk for bone loss.  Biology of Weight Control  Clinical staff conducted group or individual video education with verbal and written material and guidebook.  Patient learns that weight gain occurs because we consume more calories than we burn (eating more, moving less). Even if your body weight is normal, you may have higher ratios of fat compared to muscle mass. Too much body fat puts you at increased risk for cardiovascular disease, heart attack, stroke, type 2 diabetes, and obesity-related cancers. In addition to exercise, following the [place] can help reduce your risk.  Decoding Lab Results  Clinical staff conducted group or individual video education with verbal and written material and guidebook.  Patient learns that lab test reflects one measurement whose values change over time and are influenced by many factors, including medication, stress, sleep, exercise, food, hydration, pre-existing medical conditions, and  more. It is recommended to use the knowledge  from this video to become more involved with your lab results and evaluate your numbers to speak with your doctor.   Diseases of Our Time - Overview  Clinical staff conducted group or individual video education with verbal and written material and guidebook.  Patient learns that according to the CDC, 50% to 70% of chronic diseases (such as obesity, type 2 diabetes, elevated lipids, hypertension, and heart disease) are avoidable through lifestyle improvements including healthier food choices, listening to satiety cues, and increased physical activity.  Sleep Disorders Clinical staff conducted group or individual video education with verbal and written material and guidebook.  Patient learns how good quality and duration of sleep are important to overall health and well-being. Patient also learns about sleep disorders and how they impact health along with recommendations to address them, including discussing with a physician.  Nutrition  Dining Out - Part 2 Clinical staff conducted group or individual video education with verbal and written material and guidebook.  Patient learns how to plan ahead and communicate in order to maximize their dining experience in a healthy and nutritious manner. Included are recommended food choices based on the type of restaurant the patient is visiting.   Fueling a Best boy conducted group or individual video education with verbal and written material and guidebook.  There is a strong connection between our food choices and our health. Diseases like obesity and type 2 diabetes are very prevalent and are in large-part due to lifestyle choices. The Pritikin Eating Plan provides plenty of food and hunger-curbing satisfaction. It is easy to follow, affordable, and helps reduce health risks.  Menu Workshop  Clinical staff conducted group or individual video education with verbal and written material and  guidebook.  Patient learns that restaurant meals can sabotage health goals because they are often packed with calories, fat, sodium, and sugar. Recommendations include strategies to plan ahead and to communicate with the manager, chef, or server to help order a healthier meal.  Planning Your Eating Strategy  Clinical staff conducted group or individual video education with verbal and written material and guidebook.  Patient learns about the [place] and its benefit of reducing the risk of disease. The [place] does not focus on calories. Instead, it emphasizes high-quality, nutrient-rich foods. By knowing the characteristics of the foods, we choose, we can determine their calorie density and make informed decisions.  Targeting Your Nutrition Priorities  Clinical staff conducted group or individual video education with verbal and written material and guidebook.  Patient learns that lifestyle habits have a tremendous impact on disease risk and progression. This video provides eating and physical activity recommendations based on your personal health goals, such as reducing LDL cholesterol, losing weight, preventing or controlling type 2 diabetes, and reducing high blood pressure.  Vitamins and Minerals  Clinical staff conducted group or individual video education with verbal and written material and guidebook.  Patient learns different ways to obtain key vitamins and minerals, including through a recommended healthy diet. It is important to discuss all supplements you take with your doctor.   Healthy Mind-Set    Smoking Cessation  Clinical staff conducted group or individual video education with verbal and written material and guidebook.  Patient learns that cigarette smoking and tobacco addiction pose a serious health risk which affects millions of people. Stopping smoking will significantly reduce the risk of heart disease, lung disease, and many forms of cancer.  Recommended strategies for quitting are covered, including working  with your doctor to develop a successful plan.  Culinary   Becoming a Financial trader conducted group or individual video education with verbal and written material and guidebook.  Patient learns that cooking at home can be healthy, cost-effective, quick, and puts them in control. Keys to cooking healthy recipes will include looking at your recipe, assessing your equipment needs, planning ahead, making it simple, choosing cost-effective seasonal ingredients, and limiting the use of added fats, salts, and sugars.  Cooking - Breakfast and Snacks  Clinical staff conducted group or individual video education with verbal and written material and guidebook.  Patient learns how important breakfast is to satiety and nutrition through the entire day. Recommendations include key foods to eat during breakfast to help stabilize blood sugar levels and to prevent overeating at meals later in the day. Planning ahead is also a key component.  Cooking - Human resources officer conducted group or individual video education with verbal and written material and guidebook.  Patient learns eating strategies to improve overall health, including an approach to cook more at home. Recommendations include thinking of animal protein as a side on your plate rather than center stage and focusing instead on lower calorie dense options like vegetables, fruits, whole grains, and plant-based proteins, such as beans. Making sauces in large quantities to freeze for later and leaving the skin on your vegetables are also recommended to maximize your experience.  Cooking - Healthy Salads and Dressing Clinical staff conducted group or individual video education with verbal and written material and guidebook.  Patient learns that vegetables, fruits, whole grains, and legumes are the foundations of the [place]. Recommendations include how  to incorporate each of these in flavorful and healthy salads, and how to create homemade salad dressings. Proper handling of ingredients is also covered. Cooking - Soups and [employer] - Soups and Desserts Clinical staff conducted group or individual video education with verbal and written material and guidebook.  Patient learns that Pritikin soups and desserts make for easy, nutritious, and delicious snacks and meal components that are low in sodium, fat, sugar, and calorie density, while high in vitamins, minerals, and filling fiber. Recommendations include simple and healthy ideas for soups and desserts.   Overview     The Pritikin Solution Program Overview Clinical staff conducted group or individual video education with verbal and written material and guidebook.  Patient learns that the results of the [place] Program have been documented in more than 100 articles published in peer-reviewed journals, and the benefits include reducing risk factors for (and, in some cases, even reversing) high cholesterol, high blood pressure, type 2 diabetes, obesity, and more! An overview of the three key pillars of the Pritikin Program will be covered: eating well, doing regular exercise, and having a healthy mind-set.  WORKSHOPS  Exercise: Exercise Basics: Building Your Action Plan Clinical staff led group instruction and group discussion with PowerPoint presentation and patient guidebook. To enhance the learning environment the use of posters, models and videos may be added. At the conclusion of this workshop, patients will comprehend the difference between physical activity and exercise, as well as the benefits of incorporating both, into their routine. Patients will understand the FITT (Frequency, Intensity, Time, and Type) principle and how to use it to build an exercise action plan. In addition, safety concerns and other considerations for exercise and cardiac rehab will be addressed by the  presenter. The purpose of this lesson is to  promote a comprehensive and effective weekly exercise routine in order to improve patients' overall level of fitness.   Managing Heart Disease: Your Path to a Healthier Heart Clinical staff led group instruction and group discussion with PowerPoint presentation and patient guidebook. To enhance the learning environment the use of posters, models and videos may be added.At the conclusion of this workshop, patients will understand the anatomy and physiology of the heart. Additionally, they will understand how Pritikin's three pillars impact the risk factors, the progression, and the management of heart disease.  The purpose of this lesson is to provide a high-level overview of the heart, heart disease, and how the Pritikin lifestyle positively impacts risk factors.  Exercise Biomechanics Clinical staff led group instruction and group discussion with PowerPoint presentation and patient guidebook. To enhance the learning environment the use of posters, models and videos may be added. Patients will learn how the structural parts of their bodies function and how these functions impact their daily activities, movement, and exercise. Patients will learn how to promote a neutral spine, learn how to manage pain, and identify ways to improve their physical movement in order to promote healthy living. The purpose of this lesson is to expose patients to common physical limitations that impact physical activity. Participants will learn practical ways to adapt and manage aches and pains, and to minimize their effect on regular exercise. Patients will learn how to maintain good posture while sitting, walking, and lifting.  Balance Training and Fall Prevention  Clinical staff led group instruction and group discussion with PowerPoint presentation and patient guidebook. To enhance the learning environment the use of posters, models and videos may be added. At the  conclusion of this workshop, patients will understand the importance of their sensorimotor skills (vision, proprioception, and the vestibular system) in maintaining their ability to balance as they age. Patients will apply a variety of balancing exercises that are appropriate for their current level of function. Patients will understand the common causes for poor balance, possible solutions to these problems, and ways to modify their physical environment in order to minimize their fall risk. The purpose of this lesson is to teach patients about the importance of maintaining balance as they age and ways to minimize their risk of falling.  WORKSHOPS   Nutrition:  Fueling a Scientist, research (physical sciences) led group instruction and group discussion with PowerPoint presentation and patient guidebook. To enhance the learning environment the use of posters, models and videos may be added. Patients will review the foundational principles of the [place] and understand what constitutes a serving size in each of the food groups. Patients will also learn Pritikin-friendly foods that are better choices when away from home and review make-ahead meal and snack options. Calorie density will be reviewed and applied to three nutrition priorities: weight maintenance, weight loss, and weight gain. The purpose of this lesson is to reinforce (in a group setting) the key concepts around what patients are recommended to eat and how to apply these guidelines when away from home by planning and selecting Pritikin-friendly options. Patients will understand how calorie density may be adjusted for different weight management goals.  Mindful Eating  Clinical staff led group instruction and group discussion with PowerPoint presentation and patient guidebook. To enhance the learning environment the use of posters, models and videos may be added. Patients will briefly review the concepts of the [place] and the  importance of low-calorie dense foods. The concept of mindful eating will  be introduced as well as the importance of paying attention to internal hunger signals. Triggers for non-hunger eating and techniques for dealing with triggers will be explored. The purpose of this lesson is to provide patients with the opportunity to review the basic principles of the [place], discuss the value of eating mindfully and how to measure internal cues of hunger and fullness using the Hunger Scale. Patients will also discuss reasons for non-hunger eating and learn strategies to use for controlling emotional eating.  Targeting Your Nutrition Priorities Clinical staff led group instruction and group discussion with PowerPoint presentation and patient guidebook. To enhance the learning environment the use of posters, models and videos may be added. Patients will learn how to determine their genetic susceptibility to disease by reviewing their family history. Patients will gain insight into the importance of diet as part of an overall healthy lifestyle in mitigating the impact of genetics and other environmental insults. The purpose of this lesson is to provide patients with the opportunity to assess their personal nutrition priorities by looking at their family history, their own health history and current risk factors. Patients will also be able to discuss ways of prioritizing and modifying the [place] for their highest risk areas  Menu  Clinical staff led group instruction and group discussion with PowerPoint presentation and patient guidebook. To enhance the learning environment the use of posters, models and videos may be added. Using menus brought in from [employer], or printed from [employer], patients will apply the [place] dining out guidelines that were presented in the [employer] video. Patients will also be able to practice these guidelines in a variety of  provided scenarios. The purpose of this lesson is to provide patients with the opportunity to practice hands-on learning of the [place] with actual menus and practice scenarios.  Label Reading Clinical staff led group instruction and group discussion with PowerPoint presentation and patient guidebook. To enhance the learning environment the use of posters, models and videos may be added. Patients will review and discuss the Pritikin label reading guidelines presented in Pritikin's Label Reading Educational series video. Using fool labels brought in from local grocery stores and markets, patients will apply the label reading guidelines and determine if the packaged food meet the Pritikin guidelines. The purpose of this lesson is to provide patients with the opportunity to review, discuss, and practice hands-on learning of the Pritikin Label Reading guidelines with actual packaged food labels. [place] Workshops are designed to teach patients ways to prepare quick, simple, and affordable recipes at home. The importance of nutrition's role in chronic disease risk reduction is reflected in its emphasis in the overall Pritikin program. By learning how to prepare essential core Pritikin Eating Plan recipes, patients will increase control over what they eat; be able to customize the flavor of foods without the use of added salt, sugar, or fat; and improve the quality of the food they consume. By learning a set of core recipes which are easily assembled, quickly prepared, and affordable, patients are more likely to prepare more healthy foods at home. These workshops focus on convenient breakfasts, simple entres, side dishes, and desserts which can be prepared with minimal effort and are consistent with nutrition recommendations for cardiovascular risk reduction. Cooking [employer] are taught by a Engineer, materials (RD) who has been trained by the  [employer]. The chef or RD has a  clear understanding of the importance of minimizing - if not completely eliminating - added fat, sugar, and sodium in recipes. Throughout the series of [place] Workshop sessions, patients will learn about healthy ingredients and efficient methods of cooking to build confidence in their capability to prepare    Cooking School weekly topics:  Adding Flavor- Sodium-Free  Fast and Healthy Breakfasts  Powerhouse Plant-Based Proteins  Satisfying Salads and Dressings  Simple Sides and Sauces  International Cuisine-Spotlight on the [place] Zones  Delicious Desserts  Savory Soups  Efficiency Cooking - Meals in a Snap  Tasty Appetizers and Snacks  Comforting Weekend Breakfasts  One-Pot Wonders   Fast Evening Meals  Easy [place] (Psychosocial): New Thoughts, New Behaviors Clinical staff led group instruction and group discussion with PowerPoint presentation and patient guidebook. To enhance the learning environment the use of posters, models and videos may be added. Patients will learn and practice techniques for developing effective health and lifestyle goals. Patients will be able to effectively apply the goal setting process learned to develop at least one new personal goal.  The purpose of this lesson is to expose patients to a new skill set of behavior modification techniques such as techniques setting SMART goals, overcoming barriers, and achieving new thoughts and new behaviors.  Managing Moods and Relationships Clinical staff led group instruction and group discussion with PowerPoint presentation and patient guidebook. To enhance the learning environment the use of posters, models and videos may be added. Patients will learn how emotional and chronic stress factors can impact their health and relationships. They will learn healthy ways to manage their moods and utilize  positive coping mechanisms. In addition, ICR patients will learn ways to improve communication skills. The purpose of this lesson is to expose patients to ways of understanding how one's mood and health are intimately connected. Developing a healthy outlook can help build positive relationships and connections with others. Patients will understand the importance of utilizing effective communication skills that include actively listening and being heard. They will learn and understand the importance of the "4 Cs" and especially Connections in fostering of a Healthy Mind-Set.  Healthy Sleep for a Healthy Heart Clinical staff led group instruction and group discussion with PowerPoint presentation and patient guidebook. To enhance the learning environment the use of posters, models and videos may be added. At the conclusion of this workshop, patients will be able to demonstrate knowledge of the importance of sleep to overall health, well-being, and quality of life. They will understand the symptoms of, and treatments for, common sleep disorders. Patients will also be able to identify daytime and nighttime behaviors which impact sleep, and they will be able to apply these tools to help manage sleep-related challenges. The purpose of this lesson is to provide patients with a general overview of sleep and outline the importance of quality sleep. Patients will learn about a few of the most common sleep disorders. Patients will also be introduced to the concept of "sleep hygiene," and discover ways to self-manage certain sleeping problems through simple daily behavior changes. Finally, the workshop will motivate patients by clarifying the links between quality sleep and their goals of heart-healthy living.   Recognizing and Reducing Stress Clinical staff led group instruction and group discussion with PowerPoint presentation and patient guidebook. To enhance the learning environment the use of posters, models and  videos may be added. At the conclusion of this workshop, patients will be  able to understand the types of stress reactions, differentiate between acute and chronic stress, and recognize the impact that chronic stress has on their health. They will also be able to apply different coping mechanisms, such as reframing negative self-talk. Patients will have the opportunity to practice a variety of stress management techniques, such as deep abdominal breathing, progressive muscle relaxation, and/or guided imagery.  The purpose of this lesson is to educate patients on the role of stress in their lives and to provide healthy techniques for coping with it.  Learning Barriers/Preferences:  Learning Barriers/Preferences - 10/16/21 1205       Learning Barriers/Preferences   Learning Barriers Exercise Concerns   Ollen Gross sometimes feel lightheaded when he chnages positons   Learning Preferences Skilled Demonstration             Education Topics:  Knowledge Questionnaire Score:  Knowledge Questionnaire Score - 10/16/21 1102       Knowledge Questionnaire Score   Pre Score 19/24             Core Components/Risk Factors/Patient Goals at Admission:  Personal Goals and Risk Factors at Admission - 10/16/21 1041       Core Components/Risk Factors/Patient Goals on Admission    Weight Management Yes;Obesity;Weight Loss    Intervention Weight Management/Obesity: Establish reasonable short term and long term weight goals.;Obesity: Provide education and appropriate resources to help participant work on and attain dietary goals.    Admit Weight 206 lb 9.6 oz (93.7 kg)    Goal Weight: Short Term 196 lb 3.4 oz (89 kg)    Goal Weight: Long Term 191 lb (86.6 kg)    Expected Outcomes Short Term: Continue to assess and modify interventions until short term weight is achieved;Long Term: Adherence to nutrition and physical activity/exercise program aimed toward attainment of established weight goal;Weight Loss:  Understanding of general recommendations for a balanced deficit meal plan, which promotes 1-2 lb weight loss per week and includes a negative energy balance of [phone] kcal/d    Hypertension Yes    Intervention Provide education on lifestyle modifcations including regular physical activity/exercise, weight management, moderate sodium restriction and increased consumption of fresh fruit, vegetables, and low fat dairy, alcohol moderation, and smoking cessation.;Monitor prescription use compliance.    Expected Outcomes Short Term: Continued assessment and intervention until BP is < 140/36m HG in hypertensive participants. < 130/854mHG in hypertensive participants with diabetes, heart failure or chronic kidney disease.;Long Term: Maintenance of blood pressure at goal levels.    Lipids Yes    Intervention Provide education and support for participant on nutrition & aerobic/resistive exercise along with prescribed medications to achieve LDL '70mg'$ , HDL >'40mg'$ .    Expected Outcomes Short Term: Participant states understanding of desired cholesterol values and is compliant with medications prescribed. Participant is following exercise prescription and nutrition guidelines.;Long Term: Cholesterol controlled with medications as prescribed, with individualized exercise RX and with personalized nutrition plan. Value goals: LDL < '70mg'$ , HDL > 40 mg.    Stress Yes    Intervention Offer individual and/or small group education and counseling on adjustment to heart disease, stress management and health-related lifestyle change. Teach and support self-help strategies.;Refer participants experiencing significant psychosocial distress to appropriate mental health specialists for further evaluation and treatment. When possible, include family members and significant others in education/counseling sessions.    Expected Outcomes Short Term: Participant demonstrates changes in health-related behavior, relaxation and other stress  management skills, ability to obtain effective social support, and compliance with psychotropic  medications if prescribed.;Long Term: Emotional wellbeing is indicated by absence of clinically significant psychosocial distress or social isolation.             Core Components/Risk Factors/Patient Goals Review:   Goals and Risk Factor Review     Row Name 10/24/21 1159 10/30/21 1416 11/20/21 1637 12/18/21 1553       Core Components/Risk Factors/Patient Goals Review   Personal Goals Review Weight Management/Obesity;Lipids;Stress;Hypertension Weight Management/Obesity;Lipids;Stress;Hypertension Weight Management/Obesity;Lipids;Stress;Hypertension Weight Management/Obesity;Lipids;Stress;Hypertension    Review Willliam started intensvie cardiac rehab on 10/24/21 and did well with exercise. Vital signs were stable. Noted that metoprolol was discontinued Kailen started intensvie cardiac rehab on 10/24/21 and is off to a good start to exercise. Vital signs have been stable. Heart rate is improved now that metoprolol has been discontinued . Jaydan doing very  well with intensvie cardiac rehab Vital signs have been stable. Lavaughn has lost 2.3 kg since starting cardiac rehab. Cassandra doing very  well with intensvie cardiac rehab Vital signs have been stable. Lamin has lost 3.7 kg since starting cardiac rehab. Ellery feels stronger and has been eating healthier. Deng works out on the days he does not attend inensive cardiac rehab. Axxel will complete intensive cardiac rehab on 12/28/21    Expected Outcomes Winn will continue to participate in intensive cardiac rehab for exercise, nutrition and lifestyle modifications Izyan will continue to participate in intensive cardiac rehab for exercise, nutrition and lifestyle modifications Jencarlos will continue to participate in intensive cardiac rehab for exercise, nutrition and lifestyle modifications Kagan will continue to participate in intensive cardiac rehab for exercise,  nutrition and lifestyle modifications             Core Components/Risk Factors/Patient Goals at Discharge (Final Review):   Goals and Risk Factor Review - 12/18/21 1553       Core Components/Risk Factors/Patient Goals Review   Personal Goals Review Weight Management/Obesity;Lipids;Stress;Hypertension    Review Jerrold doing very  well with intensvie cardiac rehab Vital signs have been stable. Newell has lost 3.7 kg since starting cardiac rehab. Draken feels stronger and has been eating healthier. Yandell works out on the days he does not attend inensive cardiac rehab. Ezreal will complete intensive cardiac rehab on 12/28/21    Expected Outcomes Isak will continue to participate in intensive cardiac rehab for exercise, nutrition and lifestyle modifications             ITP Comments:  ITP Comments     Row Name 10/16/21 0857 10/24/21 1123 10/30/21 1411 11/20/21 1635 12/18/21 1550   ITP Comments Medical Director- Dr. Fransico Him, MD, Introduction to Pritikin Education Program/ Intensive Cardiac rehab. Initial Orientation Packet Reviewed with the patient 30 Day ITP Review. Rajveer started intensive  cardiac rehab on 10/24/21 and did well with exercise 30 Day ITP Review. Eamon is off to a great start to exercise at intensive cardiac rehab. 30 Day ITP Review. Sammuel has good attendance and participation in phase 2 cardiac rehab. 30 Day ITP Review. Alhassan continues to have  good attendance and participation in phase 2 cardiac rehab. Jaxyn will complete intensive cardiac rehab on 12/28/21            Comments: See ITP comments.Harrell Gave RN BSN

## 2021-12-19 ENCOUNTER — Encounter (HOSPITAL_COMMUNITY)
Admission: RE | Admit: 2021-12-19 | Discharge: 2021-12-19 | Disposition: A | Discharge status: Home / Self Care | Payer: No Typology Code available for payment source | Source: Ambulatory Visit | Attending: Cardiology | Admitting: Cardiology

## 2021-12-19 DIAGNOSIS — Z951 Presence of aortocoronary bypass graft: Secondary | ICD-10-CM

## 2021-12-21 ENCOUNTER — Encounter (HOSPITAL_COMMUNITY)
Admission: RE | Admit: 2021-12-21 | Discharge: 2021-12-21 | Disposition: A | Discharge status: Home / Self Care | Payer: No Typology Code available for payment source | Source: Ambulatory Visit | Attending: Cardiology | Admitting: Cardiology

## 2021-12-21 DIAGNOSIS — Z951 Presence of aortocoronary bypass graft: Secondary | ICD-10-CM | POA: Diagnosis not present

## 2021-12-24 ENCOUNTER — Encounter (HOSPITAL_COMMUNITY)
Admission: RE | Admit: 2021-12-24 | Discharge: 2021-12-24 | Disposition: A | Discharge status: Home / Self Care | Payer: No Typology Code available for payment source | Source: Ambulatory Visit | Attending: Cardiology | Admitting: Cardiology

## 2021-12-24 DIAGNOSIS — Z951 Presence of aortocoronary bypass graft: Secondary | ICD-10-CM | POA: Diagnosis not present

## 2021-12-26 ENCOUNTER — Encounter (HOSPITAL_COMMUNITY)
Admission: RE | Admit: 2021-12-26 | Discharge: 2021-12-26 | Disposition: A | Discharge status: Home / Self Care | Payer: No Typology Code available for payment source | Source: Ambulatory Visit | Attending: Cardiology | Admitting: Cardiology

## 2021-12-26 VITALS — Ht 66.5 in | Wt 196.0 lb

## 2021-12-26 DIAGNOSIS — Z951 Presence of aortocoronary bypass graft: Secondary | ICD-10-CM | POA: Diagnosis not present

## 2021-12-28 ENCOUNTER — Encounter (HOSPITAL_COMMUNITY)
Admission: RE | Admit: 2021-12-28 | Discharge: 2021-12-28 | Disposition: A | Discharge status: Home / Self Care | Payer: No Typology Code available for payment source | Source: Ambulatory Visit | Attending: Cardiology | Admitting: Cardiology

## 2021-12-28 DIAGNOSIS — Z951 Presence of aortocoronary bypass graft: Secondary | ICD-10-CM

## 2021-12-28 NOTE — Progress Notes (Signed)
Discharge Progress Report  Patient Details  Name: Nathan Barrett MRN: [id number] Date of Birth: Nov 13, 1965 Referring Provider:   Flowsheet Row INTENSIVE CARDIAC REHAB ORIENT from 10/16/2021 in [facility]  Referring Provider Filbert Berthold, MD  Sueanne Margarita, MD (covering)]        Number of Visits: 26 exercise sessions and 25 education sessions  Reason for Discharge:  Patient reached a stable level of exercise. Patient independent in their exercise. Patient has met program and personal goals.  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 [facility], Dr Steva Ready  ADL [facility]SD:   Initial Exercise Prescription:  Initial Exercise Prescription - 10/16/21 1000       Date of Initial Exercise RX and Referring Provider   Date 10/16/21    Referring Provider Filbert Berthold, MD   Sueanne Margarita, MD (covering)   Expected Discharge Date 12/14/21      Bike   Level 2.5    Minutes 15    METs 3.6      NuStep   Level 3    SPM 85    Minutes 15    METs 3      Prescription Details   Frequency (times per week) 3    Duration Progress to 30 minutes of continuous aerobic without signs/symptoms of physical distress      Intensity   THRR 40-80% of Max Heartrate 65-131    Ratings of Perceived Exertion 11-13    Perceived Dyspnea 0-4      Progression   Progression Continue to progress workloads to maintain intensity without signs/symptoms of physical distress.      Resistance Training   Training Prescription Yes    Weight 5 lbs    Reps 10-15             Discharge Exercise Prescription (Final Exercise Prescription Changes):  Exercise Prescription Changes - 12/28/21 1400       Response to Exercise   Blood Pressure (Admit) 110/72    Blood Pressure (Exercise) 154/76    Blood Pressure (Exit) 108/80    Heart Rate  (Admit) 82 bpm    Heart Rate (Exercise) 129 bpm    Heart Rate (Exit) 61 bpm    Rating of Perceived Exertion (Exercise) 11    Symptoms None    Comments Pt graduated from the CRP2 program today    Duration Continue with 30 min of aerobic exercise without signs/symptoms of physical distress.    Intensity THRR unchanged      Progression   Progression Continue to progress workloads to maintain intensity without signs/symptoms of physical distress.    Average METs 5.95      Resistance Training   Training Prescription Yes    Weight 6 lbs    Reps 10-15    Time 10 Minutes      Interval Training   Interval Training No      Bike   Level 3.5    Watts 79    Minutes 15    METs 7      NuStep   Level 6    SPM 177    Minutes 15    METs 4.9      Home Exercise Plan   Plans to continue exercise at Home (comment)    Frequency Add 3 additional days  to program exercise sessions.    Initial Home Exercises Provided 11/02/21             Functional Capacity:  6 Minute Walk     Row Name 10/16/21 0927 12/21/21 0915       6 Minute Walk   Phase Initial Discharge    Distance 1675 feet 1884 feet    Distance % Change -- 12.48 %    Distance Feet Change -- 209 ft    Walk Time 6 minutes 6 minutes    # of Rest Breaks 0 0    MPH 3.17 3.6    METS 3.63 4.6    RPE 11 11    Perceived Dyspnea  1 0    VO2 Peak 12.69 16    Symptoms Yes (comment) No    Comments Mild SOB, RPD=1 --    Resting HR 52 bpm 51 bpm    Resting BP 122/84 108/70    Resting Oxygen Saturation  97 % --    Exercise Oxygen Saturation  during 6 min walk 100 % --    Max Ex. HR 73 bpm 117 bpm    Max Ex. BP 120/80 130/70    2 Minute Post BP 112/80 --             Psychological, QOL, Others - Outcomes: PHQ 2/9:    12/28/2021   10:02 AM 10/16/2021   10:01 AM 09/05/2014    4:39 PM 05/18/2014    9:20 AM  Depression screen PHQ 2/9  Decreased Interest 0 3 0 0  Down, Depressed, Hopeless 0 2 0 0  PHQ - 2 Score 0 5 0 0   Altered sleeping  3    Tired, decreased energy  1    Change in appetite  0    Feeling bad or failure about yourself   2    Trouble concentrating  2    Moving slowly or fidgety/restless  0    Suicidal thoughts  0    PHQ-9 Score  13    Difficult doing work/chores  Very difficult      Quality of Life:  Quality of Life - 12/26/21 1434       Quality of Life Scores   Health/Function Pre 13.93 %    Health/Function Post 27.83 %    Health/Function % Change 99.78 %    Socioeconomic Pre 19.58 %    Socioeconomic Post 26.79 %    Socioeconomic % Change  36.82 %    Psych/Spiritual Pre 16.64 %    Psych/Spiritual Post 27.43 %    Psych/Spiritual % Change 64.84 %    Family Pre 22.8 %    Family Post 28.5 %    Family % Change 25 %    GLOBAL Pre 16.88 %    GLOBAL Post 27.63 %    GLOBAL % Change 63.68 %             Personal Goals: Goals established at orientation with interventions provided to work toward goal.  Personal Goals and Risk Factors at Admission - 10/16/21 1041       Core Components/Risk Factors/Patient Goals on Admission    Weight Management Yes;Obesity;Weight Loss    Intervention Weight Management/Obesity: Establish reasonable short term and long term weight goals.;Obesity: Provide education and appropriate resources to help participant work on and attain dietary goals.    Admit Weight 206 lb 9.6 oz (93.7 kg)    Goal Weight: Short Term 196 lb 3.4 oz (  89 kg)    Goal Weight: Long Term 191 lb (86.6 kg)    Expected Outcomes Short Term: Continue to assess and modify interventions until short term weight is achieved;Long Term: Adherence to nutrition and physical activity/exercise program aimed toward attainment of established weight goal;Weight Loss: Understanding of general recommendations for a balanced deficit meal plan, which promotes 1-2 lb weight loss per week and includes a negative energy balance of [phone] kcal/d    Hypertension Yes    Intervention Provide education on  lifestyle modifcations including regular physical activity/exercise, weight management, moderate sodium restriction and increased consumption of fresh fruit, vegetables, and low fat dairy, alcohol moderation, and smoking cessation.;Monitor prescription use compliance.    Expected Outcomes Short Term: Continued assessment and intervention until BP is < 140/28m HG in hypertensive participants. < 130/858mHG in hypertensive participants with diabetes, heart failure or chronic kidney disease.;Long Term: Maintenance of blood pressure at goal levels.    Lipids Yes    Intervention Provide education and support for participant on nutrition & aerobic/resistive exercise along with prescribed medications to achieve LDL <7077mHDL >65m57m  Expected Outcomes Short Term: Participant states understanding of desired cholesterol values and is compliant with medications prescribed. Participant is following exercise prescription and nutrition guidelines.;Long Term: Cholesterol controlled with medications as prescribed, with individualized exercise RX and with personalized nutrition plan. Value goals: LDL < 70mg107mL > 40 mg.    Stress Yes    Intervention Offer individual and/or small group education and counseling on adjustment to heart disease, stress management and health-related lifestyle change. Teach and support self-help strategies.;Refer participants experiencing significant psychosocial distress to appropriate mental health specialists for further evaluation and treatment. When possible, include family members and significant others in education/counseling sessions.    Expected Outcomes Short Term: Participant demonstrates changes in health-related behavior, relaxation and other stress management skills, ability to obtain effective social support, and compliance with psychotropic medications if prescribed.;Long Term: Emotional wellbeing is indicated by absence of clinically significant psychosocial distress or social  isolation.              Personal Goals Discharge:  Goals and Risk Factor Review     Row Name 10/24/21 1159 10/30/21 1416 11/20/21 1637 12/18/21 1553       Core Components/Risk Factors/Patient Goals Review   Personal Goals Review Weight Management/Obesity;Lipids;Stress;Hypertension Weight Management/Obesity;Lipids;Stress;Hypertension Weight Management/Obesity;Lipids;Stress;Hypertension Weight Management/Obesity;Lipids;Stress;Hypertension    Review AlvinNicholaited intensvie cardiac rehab on 10/24/21 and did well with exercise. Vital signs were stable. Noted that metoprolol was discontinued AlvinBaldoted intensvie cardiac rehab on 10/24/21 and is off to a good start to exercise. Vital signs have been stable. Heart rate is improved now that metoprolol has been discontinued . AlvinRykkerg very  well with intensvie cardiac rehab Vital signs have been stable. AlvinHuletlost 2.3 kg since starting cardiac rehab. AlvinGareldg very  well with intensvie cardiac rehab Vital signs have been stable. AlvinKelenlost 3.7 kg since starting cardiac rehab. AlvinElvens stronger and has been eating healthier. AlvinAlis out on the days he does not attend inensive cardiac rehab. AlvinAmron complete intensive cardiac rehab on 12/28/21    Expected Outcomes AlvinSylar continue to participate in intensive cardiac rehab for exercise, nutrition and lifestyle modifications AlvinWyland continue to participate in intensive cardiac rehab for exercise, nutrition and lifestyle modifications AlvinDennison continue to participate in intensive cardiac rehab for exercise, nutrition and lifestyle modifications AlvinVarian continue to participate in intensive cardiac  rehab for exercise, nutrition and lifestyle modifications             Exercise Goals and Review:  Exercise Goals     Row Name 10/16/21 0913             Exercise Goals   Increase Physical Activity Yes       Intervention Provide advice, education, support and  counseling about physical activity/exercise needs.;Develop an individualized exercise prescription for aerobic and resistive training based on initial evaluation findings, risk stratification, comorbidities and participant's personal goals.       Expected Outcomes Short Term: Attend rehab on a regular basis to increase amount of physical activity.;Long Term: Exercising regularly at least 3-5 days a week.;Long Term: Add in home exercise to make exercise part of routine and to increase amount of physical activity.       Increase Strength and Stamina Yes       Intervention Provide advice, education, support and counseling about physical activity/exercise needs.;Develop an individualized exercise prescription for aerobic and resistive training based on initial evaluation findings, risk stratification, comorbidities and participant's personal goals.       Expected Outcomes Short Term: Increase workloads from initial exercise prescription for resistance, speed, and METs.;Short Term: Perform resistance training exercises routinely during rehab and add in resistance training at home;Long Term: Improve cardiorespiratory fitness, muscular endurance and strength as measured by increased METs and functional capacity (6MWT)       Able to understand and use rate of perceived exertion (RPE) scale Yes       Intervention Provide education and explanation on how to use RPE scale       Expected Outcomes Short Term: Able to use RPE daily in rehab to express subjective intensity level;Long Term:  Able to use RPE to guide intensity level when exercising independently       Knowledge and understanding of Target Heart Rate Range (THRR) Yes       Intervention Provide education and explanation of THRR including how the numbers were predicted and where they are located for reference       Expected Outcomes Short Term: Able to state/look up THRR;Long Term: Able to use THRR to govern intensity when exercising independently;Short Term:  Able to use daily as guideline for intensity in rehab       Able to check pulse independently Yes       Intervention Provide education and demonstration on how to check pulse in carotid and radial arteries.;Review the importance of being able to check your own pulse for safety during independent exercise       Expected Outcomes Short Term: Able to explain why pulse checking is important during independent exercise;Long Term: Able to check pulse independently and accurately       Understanding of Exercise Prescription Yes       Intervention Provide education, explanation, and written materials on patient's individual exercise prescription       Expected Outcomes Short Term: Able to explain program exercise prescription;Long Term: Able to explain home exercise prescription to exercise independently                Exercise Goals Re-Evaluation:  Exercise Goals Re-Evaluation     Row Name 10/24/21 1146 11/28/21 1613 12/28/21 1502         Exercise Goal Re-Evaluation   Exercise Goals Review Increase Physical Activity;Increase Strength and Stamina;Able to understand and use rate of perceived exertion (RPE) scale;Knowledge and understanding of Target Heart Rate Range (THRR);Understanding  of Exercise Prescription Increase Physical Activity;Increase Strength and Stamina;Able to understand and use rate of perceived exertion (RPE) scale;Knowledge and understanding of Target Heart Rate Range (THRR);Understanding of Exercise Prescription Increase Physical Activity;Increase Strength and Stamina;Able to understand and use rate of perceived exertion (RPE) scale;Knowledge and understanding of Target Heart Rate Range (THRR);Understanding of Exercise Prescription     Comments Pt's first day in the CRP2 program, Pt understands the exercise Rx, RPE scale, THRR. Reviewed METs and goals. Pt had goal of weight loss and has lost 4.4 kg (9.7 lbs). Pt voices he has improved his nutrition habits which was also a goal. Pt  has peak METs of 6.9. He uses his treadmill at home on off days from CRP2 program for 40 minutes. Pt also uses his total gym and does stretching. Pt graduated from the [facility] program today. Pt had peak METs of 7.4. Pt made excellent progress. Pt goals included weight loss and imrpoving nutrition. Pt lost 11.5 lbs and has improved his diet .Pt exercises 7 x/week, 4 days at home on his treadmill and bike, Pt plans to continue exercise at home with these modalities incluing a total gym for strength training.     Expected Outcomes Will continue to monitor patient and progress exercise workloads as tolerated. Will continue to monitor patient and progress exercise workloads as tolerated. Pt will continue to exercise at home.              Nutrition & Weight - Outcomes:  Pre Biometrics - 10/16/21 0857       Pre Biometrics   Waist Circumference 44 inches    Hip Circumference 41.75 inches    Waist to Hip Ratio 1.05 %    Triceps Skinfold 21 mm    % Body Fat 32.6 %    Grip Strength 44 kg    Flexibility 10.75 in    Single Leg Stand 17.12 seconds             Post Biometrics - 12/26/21 1015        Post  Biometrics   Height 5' 6.5" (1.689 m)    Weight 88.9 kg    Waist Circumference 42 inches    Hip Circumference 41.5 inches    Waist to Hip Ratio 1.01 %    BMI (Calculated) 31.16    Triceps Skinfold 19 mm    % Body Fat 30.6 %    Grip Strength 50 kg    Flexibility 11 in    Single Leg Stand 30 seconds             Nutrition:  Nutrition Therapy & Goals - 12/14/21 1148       Nutrition Therapy   Diet Heart healthy diet    Drug/Food Interactions Statins/Certain Fruits      Personal Nutrition Goals   Nutrition Goal Patient to identify strategies for managing cardiovascular risk by attending the weekly [facility] education and nutrition classes    Personal Goal #2 Patient to limit to <1511m of sodium    Personal Goal #3 Patient to identify food sources and limit daily intake of  saturated fat, trans fat, and sodium    Comments Goals in action. AAlgernonremains extremely motivated to make lifestyle changes to aid with heart health and blood sugar control. He regularly attends the [facility] education and nutrition series. He has made many dietary changes with close attention to refined carbohydrates, fiber intake, sodium, and fiber. He is down 13# since starting with our program.  Intervention Plan   Intervention Nutrition handout(s) given to patient.;Prescribe, educate and counsel regarding individualized specific dietary modifications aiming towards targeted core components such as weight, hypertension, lipid management, diabetes, heart failure and other comorbidities.    Expected Outcomes Short Term Goal: Understand basic principles of dietary content, such as calories, fat, sodium, cholesterol and nutrients.;Long Term Goal: Adherence to prescribed nutrition plan.             Nutrition Discharge:  Nutrition Assessments - 12/31/21 0815       Rate Your Plate Scores   Post Score 76             Education Questionnaire Score:  Knowledge Questionnaire Score - 12/26/21 1435       Knowledge Questionnaire Score   Post Score 24/24             Goals reviewed with patient; copy given to patient.Pt graduates from  Intensive cardiac rehab program on 12/28/21  with completion of  26 exercise and  25 education sessions. Pt maintained good attendance and progressed nicely during their participation in rehab as evidenced by increased MET level.   Medication list reconciled. Repeat  PHQ score-  0.  Pt has made significant lifestyle changes and should be commended for their success. Orlen achieved their goals during cardiac rehab.   Pt plans to continue exercise at home by walking using his total gym and stretching. Alice increased his distance on his post exercise walk test by 209 feet. We are proud of Krystal's progress! Vansh says he feels stronger since participating  in the program. Stephen lost 5.7 kg. Tydarius changed his dietary habits and increased his exercise. Harrell Gave RN BSN

## 2023-01-21 IMAGING — DX DG CHEST 1V PORT
1 series · 1 of 1 positions shown · non-contrast
Comparison: 07/31/2020

CLINICAL DATA: Shortness of breath, history of recent cardiac
bypass grafting

EXAM:
PORTABLE CHEST 1 VIEW

[chest ap]
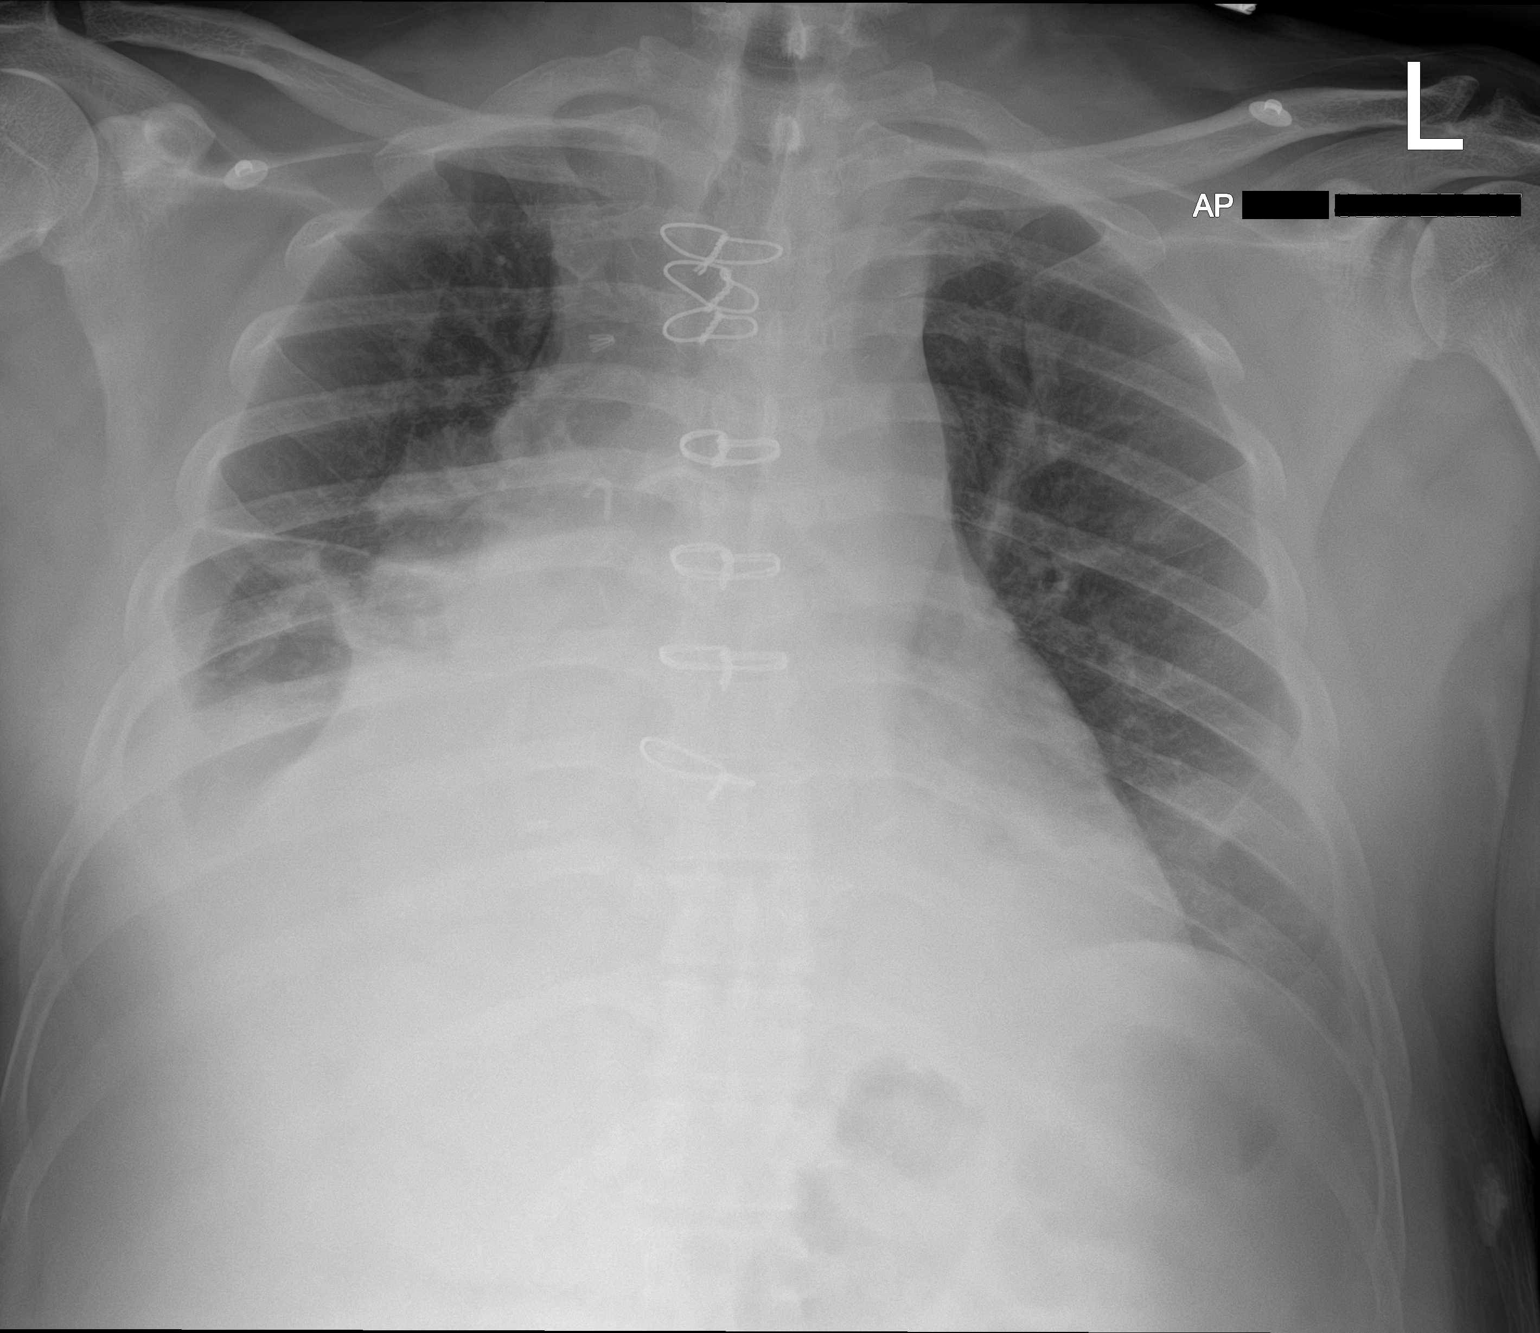

[1 of 1 positions shown; findings below may reference images not displayed]

FINDINGS: Cardiac shadow remains enlarged. Postsurgical changes are now seen.
Left lung is clear. Moderate right-sided pleural effusion is noted.
No acute bony abnormality is seen.
IMPRESSION: Moderate right-sided pleural effusion with likely underlying
atelectasis.

## 2024-02-23 ENCOUNTER — Other Ambulatory Visit: Payer: Self-pay

## 2024-02-23 ENCOUNTER — Emergency Department (HOSPITAL_COMMUNITY)
Admission: EM | Admit: 2024-02-23 | Discharge: 2024-02-24 | Disposition: A | Discharge status: Home / Self Care | Attending: Emergency Medicine | Admitting: Emergency Medicine

## 2024-02-23 ENCOUNTER — Encounter (HOSPITAL_COMMUNITY): Payer: Self-pay | Admitting: Emergency Medicine

## 2024-02-23 DIAGNOSIS — N182 Chronic kidney disease, stage 2 (mild): Secondary | ICD-10-CM | POA: Insufficient documentation

## 2024-02-23 DIAGNOSIS — Z7982 Long term (current) use of aspirin: Secondary | ICD-10-CM | POA: Insufficient documentation

## 2024-02-23 DIAGNOSIS — Z79899 Other long term (current) drug therapy: Secondary | ICD-10-CM | POA: Diagnosis not present

## 2024-02-23 DIAGNOSIS — G8918 Other acute postprocedural pain: Secondary | ICD-10-CM | POA: Insufficient documentation

## 2024-02-23 DIAGNOSIS — I2510 Atherosclerotic heart disease of native coronary artery without angina pectoris: Secondary | ICD-10-CM | POA: Insufficient documentation

## 2024-02-23 DIAGNOSIS — I129 Hypertensive chronic kidney disease with stage 1 through stage 4 chronic kidney disease, or unspecified chronic kidney disease: Secondary | ICD-10-CM | POA: Diagnosis not present

## 2024-02-23 DIAGNOSIS — M25512 Pain in left shoulder: Secondary | ICD-10-CM | POA: Diagnosis present

## 2024-02-23 DIAGNOSIS — Z7902 Long term (current) use of antithrombotics/antiplatelets: Secondary | ICD-10-CM | POA: Insufficient documentation

## 2024-02-23 MED ORDER — OXYCODONE HCL 5 MG PO TABS
10.0000 mg | ORAL_TABLET | Freq: Once | ORAL | Status: AC
  Administered 2024-02-23: 10 mg via ORAL
  Filled 2024-02-23: qty 2

## 2024-02-23 NOTE — ED Triage Notes (Signed)
 Pt bib ems from home with c/o severe shoulder pain after rotator cuff surgery at 1100 today at the [facility]. Last oxycodone  was at 1900 today.   140/palp 56HR 20RR 99RA [id number]

## 2024-02-23 NOTE — ED Provider Triage Note (Signed)
 Emergency Medicine Provider Triage Evaluation Note  WOODWARD KLEM , a 59 y.o. male  was evaluated in triage.  Pt complains of left shoulder pain after rotator cuff surgery earlier today at the [facility]. He took Tylenol  and Oxycodone  at home but not sure how much. No fever. Pain is in the shoulder and neck area.   Review of Systems  Positive: Shoulder pain Negative: Numbness, CP, SOB  Physical Exam  BP 113/73   Pulse (!) 54   Temp 98 F (36.7 C)   Resp 18   SpO2 99%  Gen:   Awake, no distress but appears uncomfortable.  Resp:  Normal effort  MSK:   Left shoulder in sling-immobilizer.   Medical Decision Making  Medically screening exam initiated at 10:21 PM.  Appropriate orders placed.  MANVILLE RICO was informed that the remainder of the evaluation will be completed by another provider, this initial triage assessment does not replace that evaluation, and the importance of remaining in the ED until their evaluation is complete.  Plan for pain mgmt primarily. Surgery was today. Extremity is well perfused with intact pulses and sensation.    Darra Fonda MATSU, MD 02/23/24 2227

## 2024-02-24 MED ORDER — OXYCODONE HCL 5 MG PO TABS
5.0000 mg | ORAL_TABLET | Freq: Once | ORAL | Status: AC
  Administered 2024-02-24: 5 mg via ORAL
  Filled 2024-02-24: qty 1

## 2024-02-24 MED ORDER — ACETAMINOPHEN 500 MG PO TABS
1000.0000 mg | ORAL_TABLET | Freq: Once | ORAL | Status: AC
  Administered 2024-02-24: 1000 mg via ORAL
  Filled 2024-02-24: qty 2

## 2024-02-24 NOTE — ED Provider Notes (Signed)
 " [facility] AT [facility] Provider Note  CSN: [id number] Arrival date & time: 02/23/24 2204  Chief Complaint(s) Post-op Problem  History provided by Patient. HPI & MDM Nathan Barrett is a 59 y.o. male with hx of CAD, CKD and HTN presents to the ED with shoulder pain s/p rotator cuff surgery. Patient reports having outpatient rotator cuff surgery 02/23/2023 without complications. Patient complains of severe shoulder pain a which started a few hours after returning home from surgery. He reports taking 1000mg  tylenol  and 5mg  oxycodone  with no relief. Pt contacted his surgeon at the [facility], surgeon recommended he take the celebrex. Patient reports feeling hot flashes after taking the celebrex. Patient reports pain is mainly in his anterior shoulder. He denies any numbess, tingling, or temperature changes to his upper extremities. He denies blood or drainage from his bandages. No fevers. No SOB or chest pain. SABRA  HPI    Medical Decision Making Risk OTC drugs. Prescription drug management.   Post op left shoulder pain No signs of large hematoma on exam. No signs of infection. He is NVI. No trauma requiring imaging at this time. Provided with additional pain meds in ER. Recommended he continue pain management as directed and reach out to surgeon for adjustment as needed.   Final Clinical Impression(s) / ED Diagnoses Final diagnoses:  Post-operative pain   The patient appears reasonably screened and/or stabilized for discharge and I doubt any other medical condition or other [facility] requiring further screening, evaluation, or treatment in the ED at this time. I have discussed the findings, Dx and Tx plan with the patient/family who expressed understanding and agree(s) with the plan. Discharge instructions discussed at length. The patient/family was given strict return precautions who verbalized understanding of the instructions. No further questions at time of  discharge.  Disposition: Discharge  Condition: Good  ED Discharge Orders     None         Follow Up: Clinic, [facility] [address] [facility] [place] [address] [phone]  Call  to schedule an appointment for close follow up     Past Medical History Past Medical History:  Diagnosis Date   Aortic stenosis    Mild   Bradycardia    CKD (chronic kidney disease), stage II    Coronary artery disease    a. multivessel, treated medically.   H. pylori infection    Hypercholesterolemia    Hypertension    Patient Active Problem List   Diagnosis Date Noted   Syncope 04/09/2012   Coronary artery disease    Hypercholesterolemia    Hypertension    Home Medication(s) Prior to Admission medications  Medication Sig Start Date End Date Taking? Authorizing Provider  acetaminophen  (TYLENOL ) 500 MG tablet Take 1,000 mg by mouth every 6 (six) hours as needed for mild pain or moderate pain.    [provider]  aspirin  EC 81 MG tablet Take 81 mg by mouth daily. Swallow whole.    [provider]  atorvastatin  (LIPITOR ) 80 MG tablet Take 1 tablet (80 mg total) by mouth every morning. Patient taking differently: Take 40 mg by mouth every morning. 07/21/19   Jordan, Peter M, MD  Cholecalciferol (VITAMIN D3) 125 MCG (5000 UT) TABS Take 5,000 Units by mouth daily.    [provider]  clopidogrel  (PLAVIX ) 75 MG tablet TAKE 1 TABLET BY MOUTH EVERY DAY Patient taking differently: Take 75 mg by mouth daily. 01/25/20   Jordan, Peter M, MD  diclofenac Sodium (VOLTAREN) 1 % GEL Apply 2 g topically daily as needed (pIN).    [provider]  losartan-hydrochlorothiazide  (HYZAAR) 100-25 MG tablet Take 1 tablet by mouth daily.    [provider]  magnesium oxide (MAG-OX) 400 (240 Mg) MG tablet Take 400 mg by mouth 3 (three) times a week.    [provider]  Multiple Vitamin (MULTIVITAMIN WITH MINERALS) TABS tablet Take 1  tablet by mouth daily. Centrum Silver men's 50 +    [provider]  omeprazole (PRILOSEC) 40 MG capsule Take 40 mg by mouth daily.    [provider]  traZODone (DESYREL) 50 MG tablet Take 100 mg by mouth at bedtime.    [provider]                                                                                                                                    Allergies Patient has no known allergies.  Review of Systems Review of Systems As noted in HPI  Physical Exam Vital Signs  I have reviewed the triage vital signs BP 111/77   Pulse (!) 54   Temp 98 F (36.7 C)   Resp 13   SpO2 100%   Physical Exam Vitals reviewed.  Constitutional:      General: He is not in acute distress.    Appearance: He is well-developed. He is not diaphoretic.  HENT:     Head: Normocephalic and atraumatic.     Right Ear: External ear normal.     Left Ear: External ear normal.     Nose: Nose normal.     Mouth/Throat:     Mouth: Mucous membranes are moist.  Eyes:     General: No scleral icterus.    Conjunctiva/sclera: Conjunctivae normal.  Neck:     Trachea: Phonation normal.  Cardiovascular:     Rate and Rhythm: Normal rate and regular rhythm.  Pulmonary:     Effort: Pulmonary effort is normal. No respiratory distress.     Breath sounds: No stridor.  Abdominal:     General: There is no distension.  Musculoskeletal:     Right shoulder: Normal pulse.     Left shoulder: Tenderness present. No swelling or deformity. Decreased range of motion. Normal strength. Normal pulse.     Cervical back: Normal range of motion.     Comments: Trochar sites are well appearing No surrounding erythema No discharge No large hematoma.  Neurological:     Mental Status: He is alert and oriented to person, place, and time.  Psychiatric:        Behavior: Behavior normal.     ED Results and Treatments Labs (all labs ordered are listed, but only abnormal results are  displayed) Labs Reviewed - No data to display  EKG  EKG Interpretation Date/Time:    Ventricular Rate:    PR Interval:    QRS Duration:    QT Interval:    QTC Calculation:   R Axis:      Text Interpretation:         Radiology No results found.  Medications Ordered in ED Medications  oxyCODONE  (Oxy IR/ROXICODONE ) immediate release tablet 10 mg (10 mg Oral Given 02/23/24 2238)  acetaminophen  (TYLENOL ) tablet 1,000 mg (1,000 mg Oral Given 02/24/24 0314)  oxyCODONE  (Oxy IR/ROXICODONE ) immediate release tablet 5 mg (5 mg Oral Given 02/24/24 0314)   Procedures Procedures  (including critical care time)   This chart was dictated using voice recognition software.  Despite best efforts to proofread,  errors can occur which can change the documentation meaning.   Trine Raynell Moder, MD 02/24/24 [phone]  "
